# Patient Record
Sex: Male | Born: 1950 | Race: Black or African American | Hispanic: No | Marital: Single | Smoking: Current every day smoker
Health system: Southern US, Community
[De-identification: ages and names within clinical notes are randomized; demographics above are authoritative.]

## PROBLEM LIST (undated history)

## (undated) DIAGNOSIS — R21 Rash and other nonspecific skin eruption: Secondary | ICD-10-CM

## (undated) DIAGNOSIS — I10 Essential (primary) hypertension: Secondary | ICD-10-CM

## (undated) DIAGNOSIS — R197 Diarrhea, unspecified: Secondary | ICD-10-CM

## (undated) DIAGNOSIS — Z923 Personal history of irradiation: Secondary | ICD-10-CM

## (undated) DIAGNOSIS — R110 Nausea: Secondary | ICD-10-CM

## (undated) DIAGNOSIS — M1990 Unspecified osteoarthritis, unspecified site: Secondary | ICD-10-CM

## (undated) DIAGNOSIS — K859 Acute pancreatitis without necrosis or infection, unspecified: Secondary | ICD-10-CM

## (undated) DIAGNOSIS — G8929 Other chronic pain: Secondary | ICD-10-CM

## (undated) DIAGNOSIS — C799 Secondary malignant neoplasm of unspecified site: Secondary | ICD-10-CM

## (undated) DIAGNOSIS — C259 Malignant neoplasm of pancreas, unspecified: Secondary | ICD-10-CM

## (undated) DIAGNOSIS — F1911 Other psychoactive substance abuse, in remission: Secondary | ICD-10-CM

## (undated) DIAGNOSIS — M5106 Intervertebral disc disorders with myelopathy, lumbar region: Secondary | ICD-10-CM

## (undated) DIAGNOSIS — M549 Dorsalgia, unspecified: Secondary | ICD-10-CM

## (undated) HISTORY — DX: Malignant neoplasm of pancreas, unspecified: C25.9

## (undated) HISTORY — DX: Diarrhea, unspecified: R19.7

## (undated) HISTORY — DX: Secondary malignant neoplasm of unspecified site: C79.9

## (undated) HISTORY — PX: HYDROCELE EXCISION / REPAIR: SUR1145

## (undated) HISTORY — DX: Nausea: R11.0

## (undated) HISTORY — DX: Personal history of irradiation: Z92.3

## (undated) HISTORY — PX: HERNIA REPAIR: SHX51

## (undated) HISTORY — DX: Other chronic pain: G89.29

## (undated) HISTORY — DX: Rash and other nonspecific skin eruption: R21

## (undated) HISTORY — DX: Other psychoactive substance abuse, in remission: F19.11

## (undated) HISTORY — DX: Dorsalgia, unspecified: M54.9

---

## 2009-11-18 ENCOUNTER — Inpatient Hospital Stay (HOSPITAL_COMMUNITY): Admission: EM | Admit: 2009-11-18 | Discharge: 2009-11-21 | Payer: Self-pay | Admitting: Emergency Medicine

## 2009-11-18 ENCOUNTER — Ambulatory Visit: Payer: Self-pay | Admitting: Gastroenterology

## 2009-11-20 ENCOUNTER — Encounter (INDEPENDENT_AMBULATORY_CARE_PROVIDER_SITE_OTHER): Payer: Self-pay | Admitting: Internal Medicine

## 2009-11-25 ENCOUNTER — Ambulatory Visit (HOSPITAL_COMMUNITY): Admission: RE | Admit: 2009-11-25 | Discharge: 2009-11-25 | Payer: Self-pay | Admitting: Internal Medicine

## 2009-11-30 ENCOUNTER — Encounter: Payer: Self-pay | Admitting: Gastroenterology

## 2009-12-23 ENCOUNTER — Ambulatory Visit (HOSPITAL_COMMUNITY): Admission: RE | Admit: 2009-12-23 | Discharge: 2009-12-23 | Payer: Self-pay | Admitting: General Surgery

## 2010-01-12 ENCOUNTER — Ambulatory Visit (HOSPITAL_COMMUNITY): Admission: RE | Admit: 2010-01-12 | Discharge: 2010-01-12 | Payer: Self-pay | Admitting: General Surgery

## 2010-01-15 ENCOUNTER — Encounter: Payer: Self-pay | Admitting: Gastroenterology

## 2010-02-08 ENCOUNTER — Inpatient Hospital Stay (HOSPITAL_COMMUNITY): Admission: RE | Admit: 2010-02-08 | Discharge: 2010-02-23 | Payer: Self-pay | Admitting: General Surgery

## 2010-02-08 ENCOUNTER — Encounter (INDEPENDENT_AMBULATORY_CARE_PROVIDER_SITE_OTHER): Payer: Self-pay | Admitting: General Surgery

## 2010-02-08 HISTORY — PX: WHIPPLE PROCEDURE: SHX2667

## 2010-02-17 ENCOUNTER — Ambulatory Visit: Payer: Self-pay | Admitting: Oncology

## 2010-03-16 DIAGNOSIS — C259 Malignant neoplasm of pancreas, unspecified: Secondary | ICD-10-CM

## 2010-03-16 HISTORY — DX: Malignant neoplasm of pancreas, unspecified: C25.9

## 2010-03-25 ENCOUNTER — Ambulatory Visit: Payer: Self-pay | Admitting: Oncology

## 2010-04-07 ENCOUNTER — Ambulatory Visit
Admission: RE | Admit: 2010-04-07 | Discharge: 2010-05-12 | Payer: Self-pay | Source: Home / Self Care | Attending: Radiation Oncology | Admitting: Radiation Oncology

## 2010-04-23 ENCOUNTER — Encounter: Payer: Self-pay | Admitting: Gastroenterology

## 2010-06-15 NOTE — Letter (Signed)
Summary: [facility]   Imported By: Sherian Rein 02/08/2010 10:26:03  _____________________________________________________________________  External Attachment:    Type:   Image     Comment:   External Document

## 2010-06-15 NOTE — Letter (Signed)
Summary: [facility]   Imported By: Sherian Rein 12/16/2009 09:47:21  _____________________________________________________________________  External Attachment:    Type:   Image     Comment:   External Document

## 2010-06-17 NOTE — Letter (Signed)
Summary: [facility]   Imported By: Lennie Odor 05/31/2010 10:36:59  _____________________________________________________________________  External Attachment:    Type:   Image     Comment:   External Document

## 2010-07-29 LAB — POCT I-STAT 7, (LYTES, BLD GAS, ICA,H+H)
Acid-Base Excess: 2 mmol/L (ref 0.0–2.0)
Acid-Base Excess: 2 mmol/L (ref 0.0–2.0)
Bicarbonate: 29.1 mEq/L — ABNORMAL HIGH (ref 20.0–24.0)
HCT: 30 % — ABNORMAL LOW (ref 39.0–52.0)
HCT: 32 % — ABNORMAL LOW (ref 39.0–52.0)
Hemoglobin: 10.2 g/dL — ABNORMAL LOW (ref 13.0–17.0)
Hemoglobin: 10.9 g/dL — ABNORMAL LOW (ref 13.0–17.0)
Patient temperature: 36.3
Potassium: 4 mEq/L (ref 3.5–5.1)
Sodium: 138 mEq/L (ref 135–145)
pCO2 arterial: 52.7 mmHg — ABNORMAL HIGH (ref 35.0–45.0)
pH, Arterial: 7.347 — ABNORMAL LOW (ref 7.350–7.450)
pH, Arterial: 7.393 (ref 7.350–7.450)
pO2, Arterial: 553 mmHg — ABNORMAL HIGH (ref 80.0–100.0)

## 2010-07-29 LAB — CBC
HCT: 33.7 % — ABNORMAL LOW (ref 39.0–52.0)
HCT: 35.1 % — ABNORMAL LOW (ref 39.0–52.0)
HCT: 32.5 % — ABNORMAL LOW (ref 39.0–52.0)
Hemoglobin: 10.5 g/dL — ABNORMAL LOW (ref 13.0–17.0)
Hemoglobin: 10.5 g/dL — ABNORMAL LOW (ref 13.0–17.0)
MCH: 27.8 pg (ref 26.0–34.0)
MCH: 28.1 pg (ref 26.0–34.0)
MCHC: 32.8 g/dL (ref 30.0–36.0)
MCHC: 32.3 g/dL (ref 30.0–36.0)
MCHC: 32.9 g/dL (ref 30.0–36.0)
MCV: 84.3 fL (ref 78.0–100.0)
MCV: 85 fL (ref 78.0–100.0)
MCV: 86.3 fL (ref 78.0–100.0)
MCV: 86.7 fL (ref 78.0–100.0)
Platelets: 304 10*3/uL (ref 150–400)
Platelets: 398 10*3/uL (ref 150–400)
Platelets: 244 10*3/uL (ref 150–400)
Platelets: 255 10*3/uL (ref 150–400)
Platelets: 258 10*3/uL (ref 150–400)
Platelets: 280 10*3/uL (ref 150–400)
Platelets: 310 10*3/uL (ref 150–400)
RBC: 4 MIL/uL — ABNORMAL LOW (ref 4.22–5.81)
RBC: 3.71 MIL/uL — ABNORMAL LOW (ref 4.22–5.81)
RBC: 3.73 MIL/uL — ABNORMAL LOW (ref 4.22–5.81)
RBC: 3.85 MIL/uL — ABNORMAL LOW (ref 4.22–5.81)
RDW: 13.3 % (ref 11.5–15.5)
RDW: 13.5 % (ref 11.5–15.5)
RDW: 13.5 % (ref 11.5–15.5)
RDW: 14 % (ref 11.5–15.5)
RDW: 14.3 % (ref 11.5–15.5)
RDW: 14.6 % (ref 11.5–15.5)
RDW: 14.6 % (ref 11.5–15.5)
RDW: 14.7 % (ref 11.5–15.5)
WBC: 7.4 10*3/uL (ref 4.0–10.5)
WBC: 10 10*3/uL (ref 4.0–10.5)
WBC: 6.9 10*3/uL (ref 4.0–10.5)
WBC: 7.5 10*3/uL (ref 4.0–10.5)

## 2010-07-29 LAB — GLUCOSE, CAPILLARY
Glucose-Capillary: 103 mg/dL — ABNORMAL HIGH (ref 70–99)
Glucose-Capillary: 104 mg/dL — ABNORMAL HIGH (ref 70–99)
Glucose-Capillary: 106 mg/dL — ABNORMAL HIGH (ref 70–99)
Glucose-Capillary: 110 mg/dL — ABNORMAL HIGH (ref 70–99)
Glucose-Capillary: 112 mg/dL — ABNORMAL HIGH (ref 70–99)
Glucose-Capillary: 113 mg/dL — ABNORMAL HIGH (ref 70–99)
Glucose-Capillary: 104 mg/dL — ABNORMAL HIGH (ref 70–99)
Glucose-Capillary: 98 mg/dL (ref 70–99)

## 2010-07-29 LAB — BASIC METABOLIC PANEL
BUN: 6 mg/dL (ref 6–23)
BUN: 6 mg/dL (ref 6–23)
CO2: 31 mEq/L (ref 19–32)
CO2: 29 mEq/L (ref 19–32)
Calcium: 8.5 mg/dL (ref 8.4–10.5)
Chloride: 98 mEq/L (ref 96–112)
Creatinine, Ser: 0.97 mg/dL (ref 0.4–1.5)
Creatinine, Ser: 0.82 mg/dL (ref 0.4–1.5)
GFR calc Af Amer: >60 mL/min (ref >60)
Glucose, Bld: 107 mg/dL — ABNORMAL HIGH (ref 70–99)
Potassium: 3.6 mEq/L (ref 3.5–5.1)

## 2010-07-29 LAB — COMPREHENSIVE METABOLIC PANEL
ALT: 23 U/L (ref 0–53)
ALT: 26 U/L (ref 0–53)
ALT: 23 U/L (ref 0–53)
ALT: 23 U/L (ref 0–53)
ALT: 29 U/L (ref 0–53)
ALT: 31 U/L (ref 0–53)
AST: 26 U/L (ref 0–37)
AST: 32 U/L (ref 0–37)
Albumin: 2.5 g/dL — ABNORMAL LOW (ref 3.5–5.2)
Albumin: 2.6 g/dL — ABNORMAL LOW (ref 3.5–5.2)
Albumin: 2.7 g/dL — ABNORMAL LOW (ref 3.5–5.2)
Albumin: 2.5 g/dL — ABNORMAL LOW (ref 3.5–5.2)
Albumin: 2.5 g/dL — ABNORMAL LOW (ref 3.5–5.2)
Albumin: 2.7 g/dL — ABNORMAL LOW (ref 3.5–5.2)
Albumin: 4.1 g/dL (ref 3.5–5.2)
Alkaline Phosphatase: 57 U/L (ref 39–117)
Alkaline Phosphatase: 65 U/L (ref 39–117)
Alkaline Phosphatase: 40 U/L (ref 39–117)
Alkaline Phosphatase: 47 U/L (ref 39–117)
Alkaline Phosphatase: 52 U/L (ref 39–117)
Alkaline Phosphatase: 70 U/L (ref 39–117)
BUN: 4 mg/dL — ABNORMAL LOW (ref 6–23)
BUN: 7 mg/dL (ref 6–23)
BUN: 13 mg/dL (ref 6–23)
BUN: 6 mg/dL (ref 6–23)
BUN: 7 mg/dL (ref 6–23)
BUN: 7 mg/dL (ref 6–23)
CO2: 25 mEq/L (ref 19–32)
Calcium: 8 mg/dL — ABNORMAL LOW (ref 8.4–10.5)
Calcium: 8.5 mg/dL (ref 8.4–10.5)
Calcium: 8.6 mg/dL (ref 8.4–10.5)
Chloride: 95 mEq/L — ABNORMAL LOW (ref 96–112)
Chloride: 103 mEq/L (ref 96–112)
Creatinine, Ser: 1.02 mg/dL (ref 0.4–1.5)
Creatinine, Ser: 0.78 mg/dL (ref 0.4–1.5)
GFR calc non Af Amer: >60 mL/min (ref >60)
Glucose, Bld: 424 mg/dL — ABNORMAL HIGH (ref 70–99)
Glucose, Bld: 77 mg/dL (ref 70–99)
Glucose, Bld: 149 mg/dL — ABNORMAL HIGH (ref 70–99)
Potassium: 3.4 mEq/L — ABNORMAL LOW (ref 3.5–5.1)
Potassium: 3.8 mEq/L (ref 3.5–5.1)
Potassium: 3.9 mEq/L (ref 3.5–5.1)
Potassium: 3.9 mEq/L (ref 3.5–5.1)
Potassium: 5.1 mEq/L (ref 3.5–5.1)
Potassium: 5.3 mEq/L — ABNORMAL HIGH (ref 3.5–5.1)
Sodium: 132 mEq/L — ABNORMAL LOW (ref 135–145)
Sodium: 140 mEq/L (ref 135–145)
Sodium: 137 mEq/L (ref 135–145)
Sodium: 137 mEq/L (ref 135–145)
Sodium: 140 mEq/L (ref 135–145)
Total Bilirubin: 0.6 mg/dL (ref 0.3–1.2)
Total Bilirubin: 0.5 mg/dL (ref 0.3–1.2)
Total Protein: 5.2 g/dL — ABNORMAL LOW (ref 6.0–8.3)
Total Protein: 5.7 g/dL — ABNORMAL LOW (ref 6.0–8.3)
Total Protein: 6.1 g/dL (ref 6.0–8.3)
Total Protein: 5.5 g/dL — ABNORMAL LOW (ref 6.0–8.3)
Total Protein: 5.5 g/dL — ABNORMAL LOW (ref 6.0–8.3)
Total Protein: 5.5 g/dL — ABNORMAL LOW (ref 6.0–8.3)
Total Protein: 5.5 g/dL — ABNORMAL LOW (ref 6.0–8.3)
Total Protein: 7.7 g/dL (ref 6.0–8.3)

## 2010-07-29 LAB — TYPE AND SCREEN: Antibody Screen: NEGATIVE

## 2010-07-29 LAB — RAPID URINE DRUG SCREEN, HOSP PERFORMED
Barbiturates: NOT DETECTED
Benzodiazepines: NOT DETECTED
Cocaine: NOT DETECTED

## 2010-07-29 LAB — CANCER ANTIGEN 19-9: CA 19-9: 24.8 U/mL — ABNORMAL LOW (ref <35.0)

## 2010-07-29 LAB — MAGNESIUM: Magnesium: 1.8 mg/dL (ref 1.5–2.5)

## 2010-07-29 LAB — PREALBUMIN: Prealbumin: 10.5 mg/dL — ABNORMAL LOW (ref 18.0–45.0)

## 2010-07-29 LAB — DIFFERENTIAL
Basophils Relative: 0 % (ref 0–1)
Eosinophils Absolute: 0.1 10*3/uL (ref 0.0–0.7)
Monocytes Absolute: 0.6 10*3/uL (ref 0.1–1.0)
Monocytes Relative: 8 % (ref 3–12)

## 2010-07-29 LAB — HEMOGLOBIN A1C
Hgb A1c MFr Bld: 6 % — ABNORMAL HIGH (ref <5.7)
Mean Plasma Glucose: 126 mg/dL — ABNORMAL HIGH (ref <117)

## 2010-07-29 LAB — PHOSPHORUS
Phosphorus: 3.5 mg/dL (ref 2.3–4.6)
Phosphorus: 3.8 mg/dL (ref 2.3–4.6)

## 2010-07-29 LAB — ABO/RH: ABO/RH(D): B POS

## 2010-07-29 LAB — APTT: aPTT: 32 seconds (ref 24–37)

## 2010-07-30 LAB — CROSSMATCH: Antibody Screen: NEGATIVE

## 2010-07-30 LAB — PREALBUMIN: Prealbumin: 15.8 mg/dL — ABNORMAL LOW (ref 18.0–45.0)

## 2010-07-30 LAB — COMPREHENSIVE METABOLIC PANEL
ALT: 30 U/L (ref 0–53)
AST: 31 U/L (ref 0–37)
Alkaline Phosphatase: 58 U/L (ref 39–117)
CO2: 28 mEq/L (ref 19–32)
Calcium: 9.6 mg/dL (ref 8.4–10.5)
Chloride: 103 mEq/L (ref 96–112)
GFR calc non Af Amer: >60 mL/min (ref >60)
Glucose, Bld: 91 mg/dL (ref 70–99)
Sodium: 139 mEq/L (ref 135–145)
Total Bilirubin: 0.4 mg/dL (ref 0.3–1.2)

## 2010-07-30 LAB — APTT: aPTT: 41 seconds — ABNORMAL HIGH (ref 24–37)

## 2010-07-30 LAB — RAPID URINE DRUG SCREEN, HOSP PERFORMED
Amphetamines: NOT DETECTED
Amphetamines: NOT DETECTED
Barbiturates: NOT DETECTED
Benzodiazepines: NOT DETECTED
Cocaine: POSITIVE — AB
Opiates: POSITIVE — AB
Tetrahydrocannabinol: NOT DETECTED
Tetrahydrocannabinol: NOT DETECTED

## 2010-07-30 LAB — CBC
HCT: 34.6 % — ABNORMAL LOW (ref 39.0–52.0)
HCT: 37.4 % — ABNORMAL LOW (ref 39.0–52.0)
Hemoglobin: 11.6 g/dL — ABNORMAL LOW (ref 13.0–17.0)
MCH: 29.2 pg (ref 26.0–34.0)
MCHC: 33.5 g/dL (ref 30.0–36.0)
MCHC: 33 g/dL (ref 30.0–36.0)
MCV: 88.3 fL (ref 78.0–100.0)
Platelets: 441 10*3/uL — ABNORMAL HIGH (ref 150–400)
RBC: 3.96 MIL/uL — ABNORMAL LOW (ref 4.22–5.81)
RDW: 15.3 % (ref 11.5–15.5)
WBC: 5.8 10*3/uL (ref 4.0–10.5)

## 2010-07-30 LAB — PROTIME-INR
INR: 1.11 (ref 0.00–1.49)
Prothrombin Time: 14.2 seconds (ref 11.6–15.2)
Prothrombin Time: 14.5 seconds (ref 11.6–15.2)

## 2010-07-30 LAB — URINALYSIS, ROUTINE W REFLEX MICROSCOPIC
Bilirubin Urine: NEGATIVE
Leukocytes, UA: NEGATIVE
Nitrite: NEGATIVE
Specific Gravity, Urine: 1.023 (ref 1.005–1.030)
Urobilinogen, UA: 1 mg/dL (ref 0.0–1.0)
pH: 6.5 (ref 5.0–8.0)

## 2010-07-30 LAB — SURGICAL PCR SCREEN
MRSA, PCR: NEGATIVE
Staphylococcus aureus: NEGATIVE

## 2010-07-30 LAB — DIFFERENTIAL
Basophils Absolute: 0 10*3/uL (ref 0.0–0.1)
Basophils Relative: 1 % (ref 0–1)
Eosinophils Absolute: 0.1 10*3/uL (ref 0.0–0.7)
Eosinophils Relative: 2 % (ref 0–5)
Neutrophils Relative %: 49 % (ref 43–77)

## 2010-07-30 LAB — URINE MICROSCOPIC-ADD ON

## 2010-07-30 LAB — TYPE AND SCREEN
ABO/RH(D): B POS
Antibody Screen: NEGATIVE

## 2010-08-01 LAB — URINE CULTURE: Culture: NO GROWTH

## 2010-08-01 LAB — CHROMOGRANIN A: Chromogranin A: 7.6 ng/mL (ref 1.9–15.0)

## 2010-08-01 LAB — DIFFERENTIAL
Lymphocytes Relative: 14 % (ref 12–46)
Lymphs Abs: 1.5 10*3/uL (ref 0.7–4.0)
Monocytes Absolute: 0.4 10*3/uL (ref 0.1–1.0)
Monocytes Relative: 4 % (ref 3–12)
Neutro Abs: 8.8 10*3/uL — ABNORMAL HIGH (ref 1.7–7.7)
Neutrophils Relative %: 82 % — ABNORMAL HIGH (ref 43–77)

## 2010-08-01 LAB — HEPATIC FUNCTION PANEL
ALT: 55 U/L — ABNORMAL HIGH (ref 0–53)
AST: 84 U/L — ABNORMAL HIGH (ref 0–37)
Albumin: 3.9 g/dL (ref 3.5–5.2)
Alkaline Phosphatase: 75 U/L (ref 39–117)
Bilirubin, Direct: <0.1 mg/dL (ref 0.0–0.3)
Total Bilirubin: 0.3 mg/dL (ref 0.3–1.2)

## 2010-08-01 LAB — GLUCOSE, CAPILLARY
Glucose-Capillary: 109 mg/dL — ABNORMAL HIGH (ref 70–99)
Glucose-Capillary: 81 mg/dL (ref 70–99)

## 2010-08-01 LAB — POCT I-STAT, CHEM 8
BUN: 13 mg/dL (ref 6–23)
Creatinine, Ser: 1 mg/dL (ref 0.4–1.5)
Glucose, Bld: 118 mg/dL — ABNORMAL HIGH (ref 70–99)
Sodium: 137 mEq/L (ref 135–145)
TCO2: 29 mmol/L (ref 0–100)

## 2010-08-01 LAB — COMPREHENSIVE METABOLIC PANEL
AST: 108 U/L — ABNORMAL HIGH (ref 0–37)
Albumin: 3.1 g/dL — ABNORMAL LOW (ref 3.5–5.2)
Alkaline Phosphatase: 57 U/L (ref 39–117)
Alkaline Phosphatase: 64 U/L (ref 39–117)
BUN: 6 mg/dL (ref 6–23)
BUN: 8 mg/dL (ref 6–23)
CO2: 25 mEq/L (ref 19–32)
Chloride: 100 mEq/L (ref 96–112)
Chloride: 99 mEq/L (ref 96–112)
GFR calc Af Amer: >60 mL/min (ref >60)
GFR calc non Af Amer: >60 mL/min (ref >60)
Glucose, Bld: 125 mg/dL — ABNORMAL HIGH (ref 70–99)
Potassium: 3.8 mEq/L (ref 3.5–5.1)
Potassium: 4.5 mEq/L (ref 3.5–5.1)
Total Bilirubin: 0.4 mg/dL (ref 0.3–1.2)
Total Bilirubin: 0.6 mg/dL (ref 0.3–1.2)

## 2010-08-01 LAB — CBC
HCT: 33.6 % — ABNORMAL LOW (ref 39.0–52.0)
HCT: 40.6 % (ref 39.0–52.0)
HCT: 42.3 % (ref 39.0–52.0)
Hemoglobin: 11 g/dL — ABNORMAL LOW (ref 13.0–17.0)
Hemoglobin: 11.5 g/dL — ABNORMAL LOW (ref 13.0–17.0)
Hemoglobin: 14 g/dL (ref 13.0–17.0)
MCHC: 32.8 g/dL (ref 30.0–36.0)
MCHC: 33.1 g/dL (ref 30.0–36.0)
MCV: 88.6 fL (ref 78.0–100.0)
MCV: 89.6 fL (ref 78.0–100.0)
RBC: 3.75 MIL/uL — ABNORMAL LOW (ref 4.22–5.81)
RBC: 3.89 MIL/uL — ABNORMAL LOW (ref 4.22–5.81)
RBC: 4.58 MIL/uL (ref 4.22–5.81)
RBC: 4.75 MIL/uL (ref 4.22–5.81)
RDW: 13.9 % (ref 11.5–15.5)
WBC: 10.1 10*3/uL (ref 4.0–10.5)
WBC: 7 10*3/uL (ref 4.0–10.5)
WBC: 8.3 10*3/uL (ref 4.0–10.5)

## 2010-08-01 LAB — POCT CARDIAC MARKERS: Myoglobin, poc: 48 ng/mL (ref 12–200)

## 2010-08-01 LAB — URINALYSIS, ROUTINE W REFLEX MICROSCOPIC
Bilirubin Urine: NEGATIVE
Nitrite: NEGATIVE
Specific Gravity, Urine: 1.028 (ref 1.005–1.030)
Urobilinogen, UA: 0.2 mg/dL (ref 0.0–1.0)
pH: 6 (ref 5.0–8.0)

## 2010-08-01 LAB — BASIC METABOLIC PANEL
Calcium: 8.6 mg/dL (ref 8.4–10.5)
GFR calc Af Amer: >60 mL/min (ref >60)
GFR calc non Af Amer: >60 mL/min (ref >60)
Sodium: 137 mEq/L (ref 135–145)

## 2010-08-01 LAB — CEA: CEA: 1.1 ng/mL (ref 0.0–5.0)

## 2010-08-01 LAB — URINE DRUGS OF ABUSE SCREEN W ALC, ROUTINE (REF LAB)
Barbiturate Quant, Ur: NEGATIVE
Benzodiazepines.: NEGATIVE
Marijuana Metabolite: NEGATIVE
Opiate Screen, Urine: NEGATIVE
Phencyclidine (PCP): NEGATIVE
Propoxyphene: NEGATIVE

## 2010-08-01 LAB — 5 HIAA, QUANTITATIVE, URINE, 24 HOUR
5-HIAA, 24 Hr Urine: 5.3 mg/24 h (ref <=6.0)
Volume, Urine-5HIAA: 2100 mL/24 h

## 2010-08-01 LAB — PROTIME-INR
INR: 1.18 (ref 0.00–1.49)
Prothrombin Time: 14.9 seconds (ref 11.6–15.2)

## 2010-08-01 LAB — RAPID URINE DRUG SCREEN, HOSP PERFORMED
Amphetamines: NOT DETECTED
Barbiturates: NOT DETECTED
Cocaine: NOT DETECTED
Opiates: POSITIVE — AB

## 2010-08-01 LAB — URINE MICROSCOPIC-ADD ON

## 2010-08-01 LAB — CANCER ANTIGEN 19-9: CA 19-9: 29.1 U/mL — ABNORMAL LOW (ref <35.0)

## 2010-08-01 LAB — LIPASE, BLOOD: Lipase: 80 U/L — ABNORMAL HIGH (ref 11–59)

## 2010-08-06 ENCOUNTER — Other Ambulatory Visit: Payer: Self-pay | Admitting: Orthopaedic Surgery

## 2010-08-06 ENCOUNTER — Encounter (HOSPITAL_COMMUNITY): Payer: Medicaid Other

## 2010-08-06 LAB — CBC
Hemoglobin: 13.9 g/dL (ref 13.0–17.0)
MCH: 29.8 pg (ref 26.0–34.0)
MCHC: 32.6 g/dL (ref 30.0–36.0)
MCV: 91.2 fL (ref 78.0–100.0)
RBC: 4.67 MIL/uL (ref 4.22–5.81)

## 2010-08-06 LAB — URINE MICROSCOPIC-ADD ON

## 2010-08-06 LAB — SURGICAL PCR SCREEN
MRSA, PCR: NEGATIVE
Staphylococcus aureus: NEGATIVE

## 2010-08-06 LAB — PROTIME-INR: Prothrombin Time: 14.5 seconds (ref 11.6–15.2)

## 2010-08-06 LAB — URINALYSIS, ROUTINE W REFLEX MICROSCOPIC
Glucose, UA: NEGATIVE mg/dL
Hgb urine dipstick: NEGATIVE
Specific Gravity, Urine: 1.026 (ref 1.005–1.030)

## 2010-08-06 LAB — DIFFERENTIAL
Basophils Relative: 0 % (ref 0–1)
Eosinophils Absolute: 0.1 10*3/uL (ref 0.0–0.7)
Lymphs Abs: 2.4 10*3/uL (ref 0.7–4.0)
Monocytes Absolute: 0.4 10*3/uL (ref 0.1–1.0)
Monocytes Relative: 9 % (ref 3–12)
Neutro Abs: 1.9 10*3/uL (ref 1.7–7.7)
Neutrophils Relative %: 39 % — ABNORMAL LOW (ref 43–77)

## 2010-08-06 LAB — BASIC METABOLIC PANEL
Chloride: 106 mEq/L (ref 96–112)
GFR calc Af Amer: >60 mL/min (ref >60)
GFR calc non Af Amer: >60 mL/min (ref >60)
Potassium: 4.5 mEq/L (ref 3.5–5.1)
Sodium: 141 mEq/L (ref 135–145)

## 2010-08-13 ENCOUNTER — Inpatient Hospital Stay (HOSPITAL_COMMUNITY): Payer: Medicaid Other

## 2010-08-13 ENCOUNTER — Inpatient Hospital Stay (HOSPITAL_COMMUNITY)
Admission: RE | Admit: 2010-08-13 | Discharge: 2010-08-20 | DRG: 470 | Disposition: A | Discharge status: Home Health | Payer: Medicaid Other | Source: Ambulatory Visit | Attending: Orthopaedic Surgery | Admitting: Orthopaedic Surgery

## 2010-08-13 DIAGNOSIS — I10 Essential (primary) hypertension: Secondary | ICD-10-CM | POA: Diagnosis present

## 2010-08-13 DIAGNOSIS — Z8509 Personal history of malignant neoplasm of other digestive organs: Secondary | ICD-10-CM

## 2010-08-13 DIAGNOSIS — Z01812 Encounter for preprocedural laboratory examination: Secondary | ICD-10-CM

## 2010-08-13 DIAGNOSIS — F17200 Nicotine dependence, unspecified, uncomplicated: Secondary | ICD-10-CM | POA: Diagnosis present

## 2010-08-13 DIAGNOSIS — M169 Osteoarthritis of hip, unspecified: Principal | ICD-10-CM | POA: Diagnosis present

## 2010-08-13 DIAGNOSIS — M1610 Unilateral primary osteoarthritis, unspecified hip: Principal | ICD-10-CM | POA: Diagnosis present

## 2010-08-13 LAB — TYPE AND SCREEN
ABO/RH(D): B POS
Antibody Screen: NEGATIVE

## 2010-08-14 HISTORY — PX: OTHER SURGICAL HISTORY: SHX169

## 2010-08-14 LAB — CBC
MCH: 29 pg (ref 26.0–34.0)
Platelets: 236 10*3/uL (ref 150–400)
RBC: 3.97 MIL/uL — ABNORMAL LOW (ref 4.22–5.81)
WBC: 6.7 10*3/uL (ref 4.0–10.5)

## 2010-08-14 LAB — BASIC METABOLIC PANEL
Chloride: 100 mEq/L (ref 96–112)
Creatinine, Ser: 0.97 mg/dL (ref 0.4–1.5)
GFR calc Af Amer: >60 mL/min (ref >60)
Potassium: 4.6 mEq/L (ref 3.5–5.1)

## 2010-08-14 LAB — PROTIME-INR
INR: 1.18 (ref 0.00–1.49)
Prothrombin Time: 15.2 seconds (ref 11.6–15.2)

## 2010-08-14 NOTE — H&P (Signed)
  NAMEKELLEY, [name]          ACCOUNT NO.:  [id number]  MEDICAL RECORD NO.:  [id number]           PATIENT TYPE:  I  LOCATION:  1608                         FACILITY:  [facility]  PHYSICIAN:  Vanita Panda. Magnus Ivan, M.D.DATE OF BIRTH:  Dec 25, 1950  DATE OF ADMISSION:  08/13/2010 DATE OF DISCHARGE:                             HISTORY & PHYSICAL   CHIEF COMPLAINT:  Severe right hip pain with known osteoarthritis.  HISTORY OF PRESENT ILLNESS:  [name] is a 60 year old gentleman on disability secondary to pancreatic cancer.  He has been treated by Dr. Donell Beers here with [facility] successfully.  He has had severe pain in his right hip for sometime and x-rays showed end-stage arthritis of that hip.  Due to the fact that he has done so well with his cancer treatments, but his activities of daily living are affected greatly due to his hip pain.  He wished to proceed with a total hip replacement.  He understands the risks and benefits of this and would like to proceed with surgery.  PAST MEDICAL HISTORY: 1. Pancreatic cancer. 2. Hypertension.  ALLERGIES:  HYDROMORPHONE.  MEDICATIONS: 1. Percocet. 2. Ativan. 3. Lopressor. 4. Multivitamin.  SOCIAL HISTORY:  He is on disability.  He lives alone.  He is single. He does smoke a pack a day for the last 30 years.  He does drink alcohol on occasion.  REVIEW OF SYSTEMS:  Negative for chest pain, shortness of breath, fever, chills, nausea, vomiting.  Negative for abdominal pain.  Positive for right hip pain.  PHYSICAL EXAMINATION:  VITAL SIGNS:  He is afebrile with stable vital signs. GENERAL:  He is alert, oriented x3, in no acute distress. HEENT:  Normocephalic, atraumatic.  Pupils are equal, round, and reactive to light.  Extraocular muscles are intact. NECK:  Supple. LUNGS:  Clear to auscultation bilaterally. HEART:  Regular rate and rhythm. ABDOMEN:  Soft, nontender, and nondistended with multiple scars  from previous abdominal pain surgery. EXTREMITIES:  Right hip show severe pain with internal and external rotation.  His leg lengths are equal.  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  PLAN:  We will proceed with right total hip arthroplasty today.  Again, he understands the risks and benefits of this and the reason behind planning for surgery and would like to have this done due to its affect on his activities of his daily living.  He understands the risk of acute blood loss anemia, DVT, and PE, and he would likely need skilled nursing placement after his acute hospital stay due to him living alone.     Vanita Panda. Magnus Ivan, M.D.     CYB/MEDQ  D:  08/13/2010  T:  08/14/2010  Job:  [id number]  Electronically Signed by Doneen Poisson M.D. on 08/14/2010 04:09:06 PM

## 2010-08-14 NOTE — Op Note (Signed)
NAMEAMEAR, STROJNY          ACCOUNT NO.:  [id number]  MEDICAL RECORD NO.:  [id number]           PATIENT TYPE:  I  LOCATION:  1608                         FACILITY:  [facility]  PHYSICIAN:  Vanita Panda. Magnus Ivan, M.D.DATE OF BIRTH:  [birth year]  DATE OF PROCEDURE:  08/13/2010 DATE OF DISCHARGE:                              OPERATIVE REPORT   PREOPERATIVE DIAGNOSES:  Severe osteoarthritis and degenerative joint disease, right hip.  POSTOPERATIVE DIAGNOSES:  Severe osteoarthritis and degenerative joint disease, right hip.  PROCEDURE:  Right total hip arthroplasty through direct anterior approach.  IMPLANTS:  DePuy Pinnacle sector cup with Gription size 54, size 36 neutral polyethylene liner, 36 -2 metal femoral head, size 11 Corail HA coated femoral component with standard offset.  SURGEON:  Vanita Panda. Magnus Ivan, M.D.  ANESTHESIA:  General.  ANTIBIOTICS:  2 g of IV Ancef.  BLOOD LOSS:  600 cc.  COMPLICATIONS:  None.  INDICATIONS:  [name] is a 60 year old gentleman who survived surgery from pancreatic cancer.  He has severe debilitating arthritis of his right hip and x-ray showed end-stage arthritis.  This is affecting his activities of daily living.  He did wish to proceed with total hip arthroplasty.  The risks and benefits of surgery had been explained him in detail and he did wish to proceed with surgery.  Risks include the risk of acute blood loss anemia, DVT, and PE.  DESCRIPTION OF PROCEDURE:  After informed consent was obtained, appropriate right hip was marked.  He was brought to the operating room and general anesthesia was obtained when he was on a stretcher.  A Foley catheter was placed and then traction boots were placed on his feet.  He was then placed on the Hana table and both feet were placed in a large- scale retraction with no traction applied and perineal post was placed. His right operative field was then prepped and draped with DuraPrep  and sterile drapes and a time-out was called, which identified the correct patient, correct right hip.  I then made an incision 1 cm distal and 3 cm posterior to the anterosuperior iliac spine and carried this down the leg.  I dissected down through the tensor fascia lata and this was divided longitudinally.  I then proceeded with a direct anterior approach of the hip.  A Cobra retractor was placed along the anterolateral femoral neck.  I teased up under the rectus femoris and placed a retractor immediately.  I was then able to coagulate the lateral femoral circumflex vessels.  I then divided the capsule and found blunted amount of fluid in his joint as well as significant amount of osteophytes.  I placed a Cobra retractors within the hip capsule identify the femoral neck again under direct fluoroscopic guidance as well as direct visualization.  I then made femoral neck cut and it was certainly proximal to the lesser trochanter and similar to behind neck cut.  A cork screw was put into the femoral head that was removed in itsentirety.  I then cleaned the acetabulum from debris.  I then started reaming from 2 mm increments from size 43 up to size 53.  The last few  reamers were placed under fluoroscopic guidance.  I then chose a 54 acetabular component and under direct visualization and fluoroscopic guidance, we placed this into the acetabulum and knocked this into place with a mallet.  I then placed a real 36 neutral polyethylene liner.  Next, attention was turned to the femur.  A hook was placed under the vastus ridge of the greater trochanter and then the leg was externally rotated to 90 degrees, extended, and adducted.  The jack was raised on the outside.  This raised the femur up slightly.  We used a Mueller retractor medially and then under the greater trochanter an Omni retractor.  I then released the capsule from the lateral neck anteriorly.  I then used a cookie cutter guide open  up the femoral canal as well as a rongeur.  I then began broaching especially this small Tri- Lock, thus Tri-Lock broach then started from size 8 from the Corail broaching system up to size 11.  I did look this under direct fluoroscopy and felt the same was in more varus than I would like so, I removed some more bone of the greater trochanter laterally to the lateralize the component.  I put this back in and then placed a 36 -2 head because he seemed to be too long with the 36 +1.5.  We reduced this into the acetabulum.  His leg lengths appeared equal.  It was stable over 90 degrees of external rotation as well as 45 degrees of internal rotation.  The shuck was minimal.  I then removed all trails instrumentation and placed the real HA coated size 11 femoral component by the real 36 -2 metal hip ball.  We reduced this back in the acetabulum and again this was tested fluoroscopically and clinically and it was stable.  We copiously irrigated tissue and I closed the deep joint capsule with interrupted #1 Ethibond suture.  I reapproximated tensor fascia lata with interrupted #1 Vicryl followed by 2-0 Vicryl and subcutaneous tissue with interrupted staples on the skin.  Xeroform followed by well-padded sterile dressing was applied.  The patient was taken off the Hana table, awake, and extubated and taken to the recovery room in stable count.  All final counts were correct.  There were no complications noted.     Vanita Panda. Magnus Ivan, M.D.     CYB/MEDQ  D:  08/13/2010  T:  08/14/2010  Job:  [id number]  Electronically Signed by Doneen Poisson M.D. on 08/14/2010 04:09:04 PM

## 2010-08-16 LAB — PROTIME-INR: Prothrombin Time: 24.1 seconds — ABNORMAL HIGH (ref 11.6–15.2)

## 2010-08-18 LAB — PROTIME-INR: Prothrombin Time: 22.7 seconds — ABNORMAL HIGH (ref 11.6–15.2)

## 2010-08-19 LAB — PROTIME-INR
INR: 2.03 — ABNORMAL HIGH (ref 0.00–1.49)
Prothrombin Time: 23.1 seconds — ABNORMAL HIGH (ref 11.6–15.2)

## 2010-08-20 LAB — PROTIME-INR: INR: 1.83 — ABNORMAL HIGH (ref 0.00–1.49)

## 2010-08-22 NOTE — Discharge Summary (Signed)
  NAMEMACGUIRE, HOLSINGER          ACCOUNT NO.:  [id number]  MEDICAL RECORD NO.:  [id number]           PATIENT TYPE:  I  LOCATION:  1608                         FACILITY:  [facility]  PHYSICIAN:  Vanita Panda. Magnus Ivan, M.D.DATE OF BIRTH:  [birth year]  DATE OF ADMISSION:  08/13/2010 DATE OF DISCHARGE:  08/18/2010                              DISCHARGE SUMMARY   ADMITTING DIAGNOSES:  Severe osteoarthritis and degenerative joint disease, right hip.  SECONDARY DIAGNOSES: 1. Previous pancreatic cancer. 2. Hypertension.  DISCHARGE DIAGNOSIS:  Status post right total hip arthroplasty.  PROCEDURE:  Right total hip arthroplasty through direct anterior approach on August 13, 2010.  HOSPITAL COURSE:  [name] is a 60 year old gentleman, on disability secondary to pancreatic cancer who has had successful surgery by Dr. Donell Beers at [facility].  He was found to have debilitating arthritis, involving his right hip.  It was recommended he undergo a right total hip arthroplasty due to the fact it affective on his activities of daily living and his pain.  He was taken to the operating room on the day of admission where he underwent a successful right total hip arthroplasty through the anterior approach.  He was then admitted as an inpatient to orthopedic floor.  Due to the fact that he lives alone, he is to be placed in short-term skilled nursing facility.  His hospital course was uneventful.  He remained afebrile with stable vital signs and his incision was clean, dry, and intact on the day of discharge.  DISPOSITION:  To short-term skilled nursing facility.  DISCHARGE INSTRUCTIONS:  While he is at the skilled nursing facility, he can ambulate with therapy with gait training and balance coordination. There will be no hip precautions.  His incision can get wet in the shower as well.  A followup appointment should be established with Dr. Eliberto Ivory office in 2 weeks after  discharge.  DISCHARGE MEDICATIONS: 1. Oxycodone 5 mg 1-3 p.o. q.3-4 h. p.r.n. 2. Robaxin 500 mg p.o. q.6 h. p.r.n. spasms. 3. Coumadin 5 mg one-half tablet p.o. q.6 p.m. daily, adjust dose for     target INR of 2.0 to 3.0. 4. Metoprolol 25 mg p.o. b.i.d. 5. Colace 100 mg p.o. b.i.d. 6. Multivitamin 1 p.o. daily.     Vanita Panda. Magnus Ivan, M.D.     CYB/MEDQ  D:  08/17/2010  T:  08/18/2010  Job:  [id number]  Electronically Signed by Doneen Poisson M.D. on 08/22/2010 04:15:56 PM

## 2010-11-14 DIAGNOSIS — C799 Secondary malignant neoplasm of unspecified site: Secondary | ICD-10-CM

## 2010-11-14 HISTORY — DX: Secondary malignant neoplasm of unspecified site: C79.9

## 2010-11-18 ENCOUNTER — Emergency Department (HOSPITAL_COMMUNITY): Payer: Medicaid Other

## 2010-11-18 ENCOUNTER — Emergency Department (HOSPITAL_COMMUNITY)
Admission: EM | Admit: 2010-11-18 | Discharge: 2010-11-18 | Disposition: A | Discharge status: Home / Self Care | Payer: Medicaid Other | Attending: Emergency Medicine | Admitting: Emergency Medicine

## 2010-11-18 DIAGNOSIS — I10 Essential (primary) hypertension: Secondary | ICD-10-CM | POA: Insufficient documentation

## 2010-11-18 DIAGNOSIS — K5900 Constipation, unspecified: Secondary | ICD-10-CM | POA: Insufficient documentation

## 2010-11-18 DIAGNOSIS — R109 Unspecified abdominal pain: Secondary | ICD-10-CM | POA: Insufficient documentation

## 2010-11-18 LAB — CBC
MCH: 29.6 pg (ref 26.0–34.0)
MCHC: 34 g/dL (ref 30.0–36.0)
MCV: 87.1 fL (ref 78.0–100.0)
Platelets: 295 10*3/uL (ref 150–400)
RDW: 13.2 % (ref 11.5–15.5)

## 2010-11-18 LAB — COMPREHENSIVE METABOLIC PANEL
AST: 25 U/L (ref 0–37)
Albumin: 3.1 g/dL — ABNORMAL LOW (ref 3.5–5.2)
BUN: 6 mg/dL (ref 6–23)
CO2: 25 mEq/L (ref 19–32)
Calcium: 8.6 mg/dL (ref 8.4–10.5)
Creatinine, Ser: 1 mg/dL (ref 0.50–1.35)
GFR calc Af Amer: >60 mL/min (ref >60)
GFR calc non Af Amer: >60 mL/min (ref >60)
Total Protein: 7 g/dL (ref 6.0–8.3)

## 2010-11-18 LAB — URINALYSIS, ROUTINE W REFLEX MICROSCOPIC
Glucose, UA: NEGATIVE mg/dL
Hgb urine dipstick: NEGATIVE
Ketones, ur: 15 mg/dL — AB
Leukocytes, UA: NEGATIVE
Nitrite: NEGATIVE
Protein, ur: 100 mg/dL — AB
Specific Gravity, Urine: 1.033 — ABNORMAL HIGH (ref 1.005–1.030)
pH: 5.5 (ref 5.0–8.0)

## 2010-11-18 LAB — URINE MICROSCOPIC-ADD ON

## 2010-11-18 LAB — DIFFERENTIAL
Basophils Absolute: 0 10*3/uL (ref 0.0–0.1)
Basophils Relative: 0 % (ref 0–1)
Lymphocytes Relative: 43 % (ref 12–46)
Monocytes Relative: 9 % (ref 3–12)
Neutro Abs: 2.9 10*3/uL (ref 1.7–7.7)
Neutrophils Relative %: 46 % (ref 43–77)

## 2010-11-18 MED ORDER — IOHEXOL 300 MG/ML  SOLN
80.0000 mL | Freq: Once | INTRAMUSCULAR | Status: AC | PRN
  Administered 2010-11-18: 80 mL via INTRAVENOUS

## 2010-11-22 ENCOUNTER — Telehealth (INDEPENDENT_AMBULATORY_CARE_PROVIDER_SITE_OTHER): Payer: Self-pay

## 2010-11-23 ENCOUNTER — Other Ambulatory Visit: Payer: Self-pay | Admitting: Orthopaedic Surgery

## 2010-11-23 ENCOUNTER — Other Ambulatory Visit (INDEPENDENT_AMBULATORY_CARE_PROVIDER_SITE_OTHER): Payer: Self-pay | Admitting: General Surgery

## 2010-11-23 DIAGNOSIS — C259 Malignant neoplasm of pancreas, unspecified: Secondary | ICD-10-CM

## 2010-11-23 DIAGNOSIS — M549 Dorsalgia, unspecified: Secondary | ICD-10-CM

## 2010-11-23 DIAGNOSIS — M79604 Pain in right leg: Secondary | ICD-10-CM

## 2010-11-23 DIAGNOSIS — M25551 Pain in right hip: Secondary | ICD-10-CM

## 2010-11-24 ENCOUNTER — Ambulatory Visit
Admission: RE | Admit: 2010-11-24 | Discharge: 2010-11-24 | Disposition: A | Discharge status: Home / Self Care | Payer: Medicaid Other | Source: Ambulatory Visit | Attending: Orthopaedic Surgery | Admitting: Orthopaedic Surgery

## 2010-11-24 ENCOUNTER — Other Ambulatory Visit (INDEPENDENT_AMBULATORY_CARE_PROVIDER_SITE_OTHER): Payer: Self-pay | Admitting: General Surgery

## 2010-11-24 VITALS — BP 140/85 | HR 87

## 2010-11-24 DIAGNOSIS — M549 Dorsalgia, unspecified: Secondary | ICD-10-CM

## 2010-11-24 DIAGNOSIS — M25551 Pain in right hip: Secondary | ICD-10-CM

## 2010-11-24 DIAGNOSIS — M79604 Pain in right leg: Secondary | ICD-10-CM

## 2010-11-24 DIAGNOSIS — C259 Malignant neoplasm of pancreas, unspecified: Secondary | ICD-10-CM

## 2010-11-26 ENCOUNTER — Other Ambulatory Visit: Payer: Self-pay | Admitting: Diagnostic Radiology

## 2010-11-26 ENCOUNTER — Ambulatory Visit (HOSPITAL_COMMUNITY)
Admission: RE | Admit: 2010-11-26 | Discharge: 2010-11-26 | Disposition: A | Discharge status: Home / Self Care | Payer: Medicaid Other | Source: Ambulatory Visit | Attending: General Surgery | Admitting: General Surgery

## 2010-11-26 DIAGNOSIS — C779 Secondary and unspecified malignant neoplasm of lymph node, unspecified: Secondary | ICD-10-CM | POA: Insufficient documentation

## 2010-11-26 DIAGNOSIS — C259 Malignant neoplasm of pancreas, unspecified: Secondary | ICD-10-CM | POA: Insufficient documentation

## 2010-11-26 DIAGNOSIS — C50919 Malignant neoplasm of unspecified site of unspecified female breast: Secondary | ICD-10-CM | POA: Insufficient documentation

## 2010-11-26 LAB — CBC
Hemoglobin: 13.6 g/dL (ref 13.0–17.0)
MCH: 29.5 pg (ref 26.0–34.0)
RBC: 4.61 MIL/uL (ref 4.22–5.81)

## 2010-11-26 LAB — PROTIME-INR: Prothrombin Time: 13.5 seconds (ref 11.6–15.2)

## 2010-12-03 ENCOUNTER — Encounter (INDEPENDENT_AMBULATORY_CARE_PROVIDER_SITE_OTHER): Payer: Self-pay | Admitting: General Surgery

## 2010-12-03 ENCOUNTER — Other Ambulatory Visit: Payer: Self-pay | Admitting: Oncology

## 2010-12-03 ENCOUNTER — Encounter (HOSPITAL_BASED_OUTPATIENT_CLINIC_OR_DEPARTMENT_OTHER): Payer: Medicaid Other | Admitting: Oncology

## 2010-12-03 DIAGNOSIS — R109 Unspecified abdominal pain: Secondary | ICD-10-CM

## 2010-12-03 DIAGNOSIS — C250 Malignant neoplasm of head of pancreas: Secondary | ICD-10-CM

## 2010-12-03 DIAGNOSIS — F17200 Nicotine dependence, unspecified, uncomplicated: Secondary | ICD-10-CM

## 2010-12-03 DIAGNOSIS — C259 Malignant neoplasm of pancreas, unspecified: Secondary | ICD-10-CM

## 2010-12-03 DIAGNOSIS — M519 Unspecified thoracic, thoracolumbar and lumbosacral intervertebral disc disorder: Secondary | ICD-10-CM

## 2010-12-03 LAB — CANCER ANTIGEN 19-9: CA 19-9: 15.7 U/mL (ref <35.0)

## 2010-12-03 LAB — CBC WITH DIFFERENTIAL/PLATELET
Eosinophils Absolute: 0 10*3/uL (ref 0.0–0.5)
LYMPH%: 44.1 % (ref 14.0–49.0)
MCHC: 33.2 g/dL (ref 32.0–36.0)
MCV: 86.8 fL (ref 79.3–98.0)
MONO%: 10.6 % (ref 0.0–14.0)
Platelets: 363 10*3/uL (ref 140–400)
RBC: 5 10*6/uL (ref 4.20–5.82)
nRBC: 0 % (ref 0–0)

## 2010-12-03 LAB — COMPREHENSIVE METABOLIC PANEL
ALT: 21 U/L (ref 0–53)
BUN: 10 mg/dL (ref 6–23)
CO2: 28 mEq/L (ref 19–32)
Creatinine, Ser: 0.95 mg/dL (ref 0.50–1.35)
Total Bilirubin: 0.2 mg/dL — ABNORMAL LOW (ref 0.3–1.2)

## 2010-12-03 LAB — CEA: CEA: 1.7 ng/mL (ref 0.0–5.0)

## 2010-12-08 ENCOUNTER — Encounter (HOSPITAL_COMMUNITY): Payer: Self-pay

## 2010-12-08 ENCOUNTER — Other Ambulatory Visit: Payer: Self-pay | Admitting: Oncology

## 2010-12-08 ENCOUNTER — Encounter (HOSPITAL_COMMUNITY)
Admission: RE | Admit: 2010-12-08 | Discharge: 2010-12-08 | Disposition: A | Discharge status: Home / Self Care | Payer: Medicaid Other | Source: Ambulatory Visit | Attending: Oncology | Admitting: Oncology

## 2010-12-08 DIAGNOSIS — C259 Malignant neoplasm of pancreas, unspecified: Secondary | ICD-10-CM

## 2010-12-08 DIAGNOSIS — C50919 Malignant neoplasm of unspecified site of unspecified female breast: Secondary | ICD-10-CM | POA: Insufficient documentation

## 2010-12-08 DIAGNOSIS — C786 Secondary malignant neoplasm of retroperitoneum and peritoneum: Secondary | ICD-10-CM | POA: Insufficient documentation

## 2010-12-08 DIAGNOSIS — K7689 Other specified diseases of liver: Secondary | ICD-10-CM | POA: Insufficient documentation

## 2010-12-08 LAB — GLUCOSE, CAPILLARY: Glucose-Capillary: 81 mg/dL (ref 70–99)

## 2010-12-08 MED ORDER — FLUDEOXYGLUCOSE F - 18 (FDG) INJECTION
16.7000 | Freq: Once | INTRAVENOUS | Status: AC | PRN
  Administered 2010-12-08: 16.7 via INTRAVENOUS

## 2010-12-09 ENCOUNTER — Encounter (HOSPITAL_BASED_OUTPATIENT_CLINIC_OR_DEPARTMENT_OTHER): Payer: Medicaid Other | Admitting: Oncology

## 2010-12-09 DIAGNOSIS — C250 Malignant neoplasm of head of pancreas: Secondary | ICD-10-CM

## 2010-12-15 ENCOUNTER — Other Ambulatory Visit: Payer: Self-pay | Admitting: Oncology

## 2010-12-15 ENCOUNTER — Encounter (HOSPITAL_BASED_OUTPATIENT_CLINIC_OR_DEPARTMENT_OTHER): Payer: Medicaid Other | Admitting: Oncology

## 2010-12-15 DIAGNOSIS — C250 Malignant neoplasm of head of pancreas: Secondary | ICD-10-CM

## 2010-12-15 DIAGNOSIS — C259 Malignant neoplasm of pancreas, unspecified: Secondary | ICD-10-CM

## 2010-12-16 ENCOUNTER — Other Ambulatory Visit: Payer: Self-pay | Admitting: Oncology

## 2010-12-16 ENCOUNTER — Encounter (HOSPITAL_BASED_OUTPATIENT_CLINIC_OR_DEPARTMENT_OTHER): Payer: Medicaid Other | Admitting: Oncology

## 2010-12-16 DIAGNOSIS — C259 Malignant neoplasm of pancreas, unspecified: Secondary | ICD-10-CM

## 2010-12-16 DIAGNOSIS — M519 Unspecified thoracic, thoracolumbar and lumbosacral intervertebral disc disorder: Secondary | ICD-10-CM

## 2010-12-16 DIAGNOSIS — F17200 Nicotine dependence, unspecified, uncomplicated: Secondary | ICD-10-CM

## 2010-12-16 LAB — COMPREHENSIVE METABOLIC PANEL
ALT: 25 U/L (ref 0–53)
AST: 36 U/L (ref 0–37)
CO2: 22 mEq/L (ref 19–32)
Calcium: 9.5 mg/dL (ref 8.4–10.5)
Chloride: 104 mEq/L (ref 96–112)
Potassium: 4.4 mEq/L (ref 3.5–5.3)
Sodium: 139 mEq/L (ref 135–145)
Total Protein: 7.4 g/dL (ref 6.0–8.3)

## 2010-12-16 LAB — CBC WITH DIFFERENTIAL/PLATELET
Eosinophils Absolute: 0.1 10*3/uL (ref 0.0–0.5)
MONO#: 0.5 10*3/uL (ref 0.1–0.9)
NEUT#: 2 10*3/uL (ref 1.5–6.5)
Platelets: 293 10*3/uL (ref 140–400)
RBC: 4.73 10*6/uL (ref 4.20–5.82)
RDW: 13.6 % (ref 11.0–14.6)
WBC: 4.6 10*3/uL (ref 4.0–10.3)
lymph#: 2.1 10*3/uL (ref 0.9–3.3)
nRBC: 0 % (ref 0–0)

## 2010-12-16 LAB — BILIRUBIN, DIRECT: Bilirubin, Direct: 0.1 mg/dL (ref 0.0–0.3)

## 2010-12-16 LAB — LIPID PANEL: LDL Cholesterol: 51 mg/dL (ref 0–99)

## 2010-12-16 LAB — CANCER ANTIGEN 19-9: CA 19-9: 16.9 U/mL (ref <35.0)

## 2010-12-17 ENCOUNTER — Ambulatory Visit (HOSPITAL_COMMUNITY)
Admission: RE | Admit: 2010-12-17 | Discharge: 2010-12-17 | Disposition: A | Discharge status: Home / Self Care | Payer: Medicaid Other | Source: Ambulatory Visit | Attending: Oncology | Admitting: Oncology

## 2010-12-17 ENCOUNTER — Other Ambulatory Visit (HOSPITAL_COMMUNITY): Payer: Medicaid Other

## 2010-12-17 DIAGNOSIS — C779 Secondary and unspecified malignant neoplasm of lymph node, unspecified: Secondary | ICD-10-CM | POA: Insufficient documentation

## 2010-12-17 DIAGNOSIS — C50919 Malignant neoplasm of unspecified site of unspecified female breast: Secondary | ICD-10-CM | POA: Insufficient documentation

## 2010-12-17 DIAGNOSIS — C259 Malignant neoplasm of pancreas, unspecified: Secondary | ICD-10-CM | POA: Insufficient documentation

## 2010-12-17 MED ORDER — IOHEXOL 300 MG/ML  SOLN
100.0000 mL | Freq: Once | INTRAMUSCULAR | Status: AC | PRN
  Administered 2010-12-17: 100 mL via INTRAVENOUS

## 2010-12-21 ENCOUNTER — Other Ambulatory Visit: Payer: Self-pay | Admitting: Oncology

## 2010-12-21 ENCOUNTER — Other Ambulatory Visit (HOSPITAL_COMMUNITY): Payer: Medicaid Other

## 2010-12-21 ENCOUNTER — Encounter (HOSPITAL_BASED_OUTPATIENT_CLINIC_OR_DEPARTMENT_OTHER): Payer: Medicaid Other | Admitting: Oncology

## 2010-12-21 DIAGNOSIS — C250 Malignant neoplasm of head of pancreas: Secondary | ICD-10-CM

## 2010-12-21 DIAGNOSIS — C259 Malignant neoplasm of pancreas, unspecified: Secondary | ICD-10-CM

## 2010-12-21 DIAGNOSIS — M519 Unspecified thoracic, thoracolumbar and lumbosacral intervertebral disc disorder: Secondary | ICD-10-CM

## 2010-12-21 DIAGNOSIS — F17200 Nicotine dependence, unspecified, uncomplicated: Secondary | ICD-10-CM

## 2010-12-21 DIAGNOSIS — Z5111 Encounter for antineoplastic chemotherapy: Secondary | ICD-10-CM

## 2010-12-21 LAB — LIPID PANEL
Cholesterol: 109 mg/dL (ref 0–200)
HDL: 52 mg/dL (ref >39)
LDL Cholesterol: 44 mg/dL (ref 0–99)
Triglycerides: 66 mg/dL (ref <150)
VLDL: 13 mg/dL (ref 0–40)

## 2010-12-21 LAB — CBC WITH DIFFERENTIAL/PLATELET
BASO%: 1.1 % (ref 0.0–2.0)
Basophils Absolute: 0.1 10*3/uL (ref 0.0–0.1)
Eosinophils Absolute: 0.1 10*3/uL (ref 0.0–0.5)
HCT: 43.3 % (ref 38.4–49.9)
HGB: 14.4 g/dL (ref 13.0–17.1)
LYMPH%: 41.2 % (ref 14.0–49.0)
MONO#: 0.3 10*3/uL (ref 0.1–0.9)
NEUT#: 2.2 10*3/uL (ref 1.5–6.5)
NEUT%: 47.9 % (ref 39.0–75.0)
Platelets: 272 10*3/uL (ref 140–400)
WBC: 4.7 10*3/uL (ref 4.0–10.3)
lymph#: 1.9 10*3/uL (ref 0.9–3.3)

## 2010-12-21 LAB — URINALYSIS, MICROSCOPIC - CHCC
Leukocyte Esterase: NEGATIVE
Nitrite: NEGATIVE
Protein: NEGATIVE mg/dL
pH: 6 (ref 4.6–8.0)

## 2010-12-21 LAB — LACTATE DEHYDROGENASE: LDH: 219 U/L (ref 94–250)

## 2010-12-21 LAB — COMPREHENSIVE METABOLIC PANEL
Alkaline Phosphatase: 77 U/L (ref 39–117)
BUN: 7 mg/dL (ref 6–23)
Glucose, Bld: 118 mg/dL — ABNORMAL HIGH (ref 70–99)
Total Bilirubin: 0.2 mg/dL — ABNORMAL LOW (ref 0.3–1.2)

## 2010-12-21 LAB — PHOSPHORUS: Phosphorus: 4.5 mg/dL (ref 2.3–4.6)

## 2010-12-28 ENCOUNTER — Encounter (HOSPITAL_BASED_OUTPATIENT_CLINIC_OR_DEPARTMENT_OTHER): Payer: Medicaid Other | Admitting: Oncology

## 2010-12-28 ENCOUNTER — Other Ambulatory Visit: Payer: Self-pay | Admitting: Oncology

## 2010-12-28 DIAGNOSIS — Z5111 Encounter for antineoplastic chemotherapy: Secondary | ICD-10-CM

## 2010-12-28 DIAGNOSIS — C250 Malignant neoplasm of head of pancreas: Secondary | ICD-10-CM

## 2010-12-28 DIAGNOSIS — M519 Unspecified thoracic, thoracolumbar and lumbosacral intervertebral disc disorder: Secondary | ICD-10-CM

## 2010-12-28 DIAGNOSIS — F17200 Nicotine dependence, unspecified, uncomplicated: Secondary | ICD-10-CM

## 2010-12-28 DIAGNOSIS — C259 Malignant neoplasm of pancreas, unspecified: Secondary | ICD-10-CM

## 2010-12-28 LAB — CBC WITH DIFFERENTIAL/PLATELET
BASO%: 0.2 % (ref 0.0–2.0)
Basophils Absolute: 0 10*3/uL (ref 0.0–0.1)
EOS%: 0.5 % (ref 0.0–7.0)
HCT: 40.3 % (ref 38.4–49.9)
HGB: 13.4 g/dL (ref 13.0–17.1)
MCH: 29.1 pg (ref 27.2–33.4)
MCHC: 33.3 g/dL (ref 32.0–36.0)
MCV: 87.4 fL (ref 79.3–98.0)
MONO%: 5.3 % (ref 0.0–14.0)
NEUT%: 35.9 % — ABNORMAL LOW (ref 39.0–75.0)
RDW: 13.1 % (ref 11.0–14.6)
lymph#: 2.5 10*3/uL (ref 0.9–3.3)

## 2010-12-29 ENCOUNTER — Other Ambulatory Visit: Payer: Self-pay | Admitting: Oncology

## 2010-12-29 DIAGNOSIS — C259 Malignant neoplasm of pancreas, unspecified: Secondary | ICD-10-CM

## 2011-01-04 ENCOUNTER — Encounter (HOSPITAL_BASED_OUTPATIENT_CLINIC_OR_DEPARTMENT_OTHER): Payer: Medicaid Other | Admitting: Oncology

## 2011-01-04 ENCOUNTER — Other Ambulatory Visit: Payer: Self-pay | Admitting: Oncology

## 2011-01-04 DIAGNOSIS — M519 Unspecified thoracic, thoracolumbar and lumbosacral intervertebral disc disorder: Secondary | ICD-10-CM

## 2011-01-04 DIAGNOSIS — C259 Malignant neoplasm of pancreas, unspecified: Secondary | ICD-10-CM

## 2011-01-04 DIAGNOSIS — F17200 Nicotine dependence, unspecified, uncomplicated: Secondary | ICD-10-CM

## 2011-01-04 DIAGNOSIS — C250 Malignant neoplasm of head of pancreas: Secondary | ICD-10-CM

## 2011-01-04 DIAGNOSIS — Z5111 Encounter for antineoplastic chemotherapy: Secondary | ICD-10-CM

## 2011-01-04 LAB — CBC WITH DIFFERENTIAL/PLATELET
BASO%: 0.6 % (ref 0.0–2.0)
EOS%: 0.2 % (ref 0.0–7.0)
Eosinophils Absolute: 0 10*3/uL (ref 0.0–0.5)
LYMPH%: 58 % — ABNORMAL HIGH (ref 14.0–49.0)
MCHC: 33.6 g/dL (ref 32.0–36.0)
MCV: 88.3 fL (ref 79.3–98.0)
MONO%: 7.7 % (ref 0.0–14.0)
NEUT#: 1 10*3/uL — ABNORMAL LOW (ref 1.5–6.5)
Platelets: 130 10*3/uL — ABNORMAL LOW (ref 140–400)
RBC: 4.5 10*6/uL (ref 4.20–5.82)
RDW: 13.6 % (ref 11.0–14.6)

## 2011-01-05 ENCOUNTER — Encounter (HOSPITAL_BASED_OUTPATIENT_CLINIC_OR_DEPARTMENT_OTHER): Payer: Medicaid Other | Admitting: Oncology

## 2011-01-05 DIAGNOSIS — C250 Malignant neoplasm of head of pancreas: Secondary | ICD-10-CM

## 2011-01-06 ENCOUNTER — Ambulatory Visit (HOSPITAL_COMMUNITY)
Admission: RE | Admit: 2011-01-06 | Discharge: 2011-01-06 | Disposition: A | Discharge status: Home / Self Care | Payer: Medicaid Other | Source: Ambulatory Visit | Attending: Oncology | Admitting: Oncology

## 2011-01-06 ENCOUNTER — Ambulatory Visit (HOSPITAL_COMMUNITY): Payer: Medicaid Other

## 2011-01-06 ENCOUNTER — Other Ambulatory Visit: Payer: Self-pay | Admitting: Oncology

## 2011-01-06 DIAGNOSIS — I10 Essential (primary) hypertension: Secondary | ICD-10-CM | POA: Insufficient documentation

## 2011-01-06 DIAGNOSIS — C259 Malignant neoplasm of pancreas, unspecified: Secondary | ICD-10-CM

## 2011-01-06 DIAGNOSIS — G8929 Other chronic pain: Secondary | ICD-10-CM | POA: Insufficient documentation

## 2011-01-06 DIAGNOSIS — M545 Low back pain, unspecified: Secondary | ICD-10-CM | POA: Insufficient documentation

## 2011-01-06 DIAGNOSIS — F17200 Nicotine dependence, unspecified, uncomplicated: Secondary | ICD-10-CM | POA: Insufficient documentation

## 2011-01-06 DIAGNOSIS — M79609 Pain in unspecified limb: Secondary | ICD-10-CM | POA: Insufficient documentation

## 2011-01-06 HISTORY — PX: PORTACATH PLACEMENT: SHX2246

## 2011-01-12 ENCOUNTER — Encounter (HOSPITAL_BASED_OUTPATIENT_CLINIC_OR_DEPARTMENT_OTHER): Payer: Medicaid Other | Admitting: Oncology

## 2011-01-12 ENCOUNTER — Other Ambulatory Visit: Payer: Self-pay | Admitting: Oncology

## 2011-01-12 DIAGNOSIS — C259 Malignant neoplasm of pancreas, unspecified: Secondary | ICD-10-CM

## 2011-01-12 DIAGNOSIS — F17200 Nicotine dependence, unspecified, uncomplicated: Secondary | ICD-10-CM

## 2011-01-12 DIAGNOSIS — M519 Unspecified thoracic, thoracolumbar and lumbosacral intervertebral disc disorder: Secondary | ICD-10-CM

## 2011-01-12 LAB — CBC WITH DIFFERENTIAL/PLATELET
BASO%: 0.2 % (ref 0.0–2.0)
EOS%: 0.1 % (ref 0.0–7.0)
HCT: 37 % — ABNORMAL LOW (ref 38.4–49.9)
MCH: 29.3 pg (ref 27.2–33.4)
MCHC: 34.1 g/dL (ref 32.0–36.0)
NEUT%: 77.1 % — ABNORMAL HIGH (ref 39.0–75.0)
RDW: 14.6 % (ref 11.0–14.6)
lymph#: 4 10*3/uL — ABNORMAL HIGH (ref 0.9–3.3)

## 2011-01-13 HISTORY — PX: OTHER SURGICAL HISTORY: SHX169

## 2011-01-16 ENCOUNTER — Emergency Department (HOSPITAL_COMMUNITY)
Admission: EM | Admit: 2011-01-16 | Discharge: 2011-01-16 | Disposition: A | Discharge status: Home / Self Care | Payer: Medicaid Other | Attending: Emergency Medicine | Admitting: Emergency Medicine

## 2011-01-16 DIAGNOSIS — R109 Unspecified abdominal pain: Secondary | ICD-10-CM | POA: Insufficient documentation

## 2011-01-16 DIAGNOSIS — I10 Essential (primary) hypertension: Secondary | ICD-10-CM | POA: Insufficient documentation

## 2011-01-16 DIAGNOSIS — Z8509 Personal history of malignant neoplasm of other digestive organs: Secondary | ICD-10-CM | POA: Insufficient documentation

## 2011-01-16 DIAGNOSIS — Z79899 Other long term (current) drug therapy: Secondary | ICD-10-CM | POA: Insufficient documentation

## 2011-01-16 DIAGNOSIS — R10819 Abdominal tenderness, unspecified site: Secondary | ICD-10-CM | POA: Insufficient documentation

## 2011-01-18 ENCOUNTER — Other Ambulatory Visit: Payer: Self-pay | Admitting: Oncology

## 2011-01-18 ENCOUNTER — Encounter (HOSPITAL_BASED_OUTPATIENT_CLINIC_OR_DEPARTMENT_OTHER): Payer: Medicaid Other | Admitting: Oncology

## 2011-01-18 DIAGNOSIS — Z5111 Encounter for antineoplastic chemotherapy: Secondary | ICD-10-CM

## 2011-01-18 DIAGNOSIS — F17200 Nicotine dependence, unspecified, uncomplicated: Secondary | ICD-10-CM

## 2011-01-18 DIAGNOSIS — C250 Malignant neoplasm of head of pancreas: Secondary | ICD-10-CM

## 2011-01-18 DIAGNOSIS — C259 Malignant neoplasm of pancreas, unspecified: Secondary | ICD-10-CM

## 2011-01-18 DIAGNOSIS — M519 Unspecified thoracic, thoracolumbar and lumbosacral intervertebral disc disorder: Secondary | ICD-10-CM

## 2011-01-18 LAB — CBC WITH DIFFERENTIAL/PLATELET
BASO%: 0.4 % (ref 0.0–2.0)
EOS%: 0.4 % (ref 0.0–7.0)
Eosinophils Absolute: 0.1 10*3/uL (ref 0.0–0.5)
MCH: 30.4 pg (ref 27.2–33.4)
MCV: 90.5 fL (ref 79.3–98.0)
MONO%: 11.7 % (ref 0.0–14.0)
NEUT#: 11.8 10*3/uL — ABNORMAL HIGH (ref 1.5–6.5)
RBC: 4.51 10*6/uL (ref 4.20–5.82)
RDW: 16 % — ABNORMAL HIGH (ref 11.0–14.6)

## 2011-01-18 LAB — LIPID PANEL
Cholesterol: 102 mg/dL (ref 0–200)
LDL Cholesterol: 45 mg/dL (ref 0–99)
Triglycerides: 97 mg/dL (ref <150)

## 2011-01-18 LAB — URINALYSIS, MICROSCOPIC - CHCC
Bilirubin (Urine): NEGATIVE
Glucose: NEGATIVE g/dL
Leukocyte Esterase: NEGATIVE

## 2011-01-18 LAB — PHOSPHORUS: Phosphorus: 4.1 mg/dL (ref 2.3–4.6)

## 2011-01-18 LAB — COMPREHENSIVE METABOLIC PANEL
Alkaline Phosphatase: 165 U/L — ABNORMAL HIGH (ref 39–117)
BUN: 10 mg/dL (ref 6–23)
Glucose, Bld: 63 mg/dL — ABNORMAL LOW (ref 70–99)
Total Bilirubin: 0.1 mg/dL — ABNORMAL LOW (ref 0.3–1.2)

## 2011-01-18 LAB — BILIRUBIN, DIRECT: Bilirubin, Direct: 0.1 mg/dL (ref 0.0–0.3)

## 2011-01-18 LAB — LACTATE DEHYDROGENASE: LDH: 379 U/L — ABNORMAL HIGH (ref 94–250)

## 2011-01-24 ENCOUNTER — Encounter (HOSPITAL_BASED_OUTPATIENT_CLINIC_OR_DEPARTMENT_OTHER): Payer: Medicaid Other | Admitting: Oncology

## 2011-01-26 ENCOUNTER — Telehealth (INDEPENDENT_AMBULATORY_CARE_PROVIDER_SITE_OTHER): Payer: Self-pay | Admitting: General Surgery

## 2011-01-31 ENCOUNTER — Ambulatory Visit (HOSPITAL_COMMUNITY)
Admission: RE | Admit: 2011-01-31 | Discharge: 2011-01-31 | Disposition: A | Discharge status: Home / Self Care | Payer: Medicaid Other | Source: Ambulatory Visit | Attending: Oncology | Admitting: Oncology

## 2011-01-31 DIAGNOSIS — C786 Secondary malignant neoplasm of retroperitoneum and peritoneum: Secondary | ICD-10-CM | POA: Insufficient documentation

## 2011-01-31 DIAGNOSIS — C259 Malignant neoplasm of pancreas, unspecified: Secondary | ICD-10-CM | POA: Insufficient documentation

## 2011-01-31 DIAGNOSIS — C50919 Malignant neoplasm of unspecified site of unspecified female breast: Secondary | ICD-10-CM | POA: Insufficient documentation

## 2011-01-31 DIAGNOSIS — C787 Secondary malignant neoplasm of liver and intrahepatic bile duct: Secondary | ICD-10-CM | POA: Insufficient documentation

## 2011-01-31 MED ORDER — IOHEXOL 300 MG/ML  SOLN
100.0000 mL | Freq: Once | INTRAMUSCULAR | Status: AC | PRN
  Administered 2011-01-31: 100 mL via INTRAVENOUS

## 2011-02-01 ENCOUNTER — Encounter (HOSPITAL_BASED_OUTPATIENT_CLINIC_OR_DEPARTMENT_OTHER): Payer: Medicaid Other | Admitting: Oncology

## 2011-02-01 ENCOUNTER — Other Ambulatory Visit: Payer: Self-pay | Admitting: Oncology

## 2011-02-01 DIAGNOSIS — M519 Unspecified thoracic, thoracolumbar and lumbosacral intervertebral disc disorder: Secondary | ICD-10-CM

## 2011-02-01 DIAGNOSIS — C786 Secondary malignant neoplasm of retroperitoneum and peritoneum: Secondary | ICD-10-CM

## 2011-02-01 DIAGNOSIS — R109 Unspecified abdominal pain: Secondary | ICD-10-CM

## 2011-02-01 DIAGNOSIS — C259 Malignant neoplasm of pancreas, unspecified: Secondary | ICD-10-CM

## 2011-02-01 DIAGNOSIS — F17200 Nicotine dependence, unspecified, uncomplicated: Secondary | ICD-10-CM

## 2011-02-01 DIAGNOSIS — C779 Secondary and unspecified malignant neoplasm of lymph node, unspecified: Secondary | ICD-10-CM

## 2011-02-01 DIAGNOSIS — M549 Dorsalgia, unspecified: Secondary | ICD-10-CM

## 2011-02-01 DIAGNOSIS — G8929 Other chronic pain: Secondary | ICD-10-CM

## 2011-02-01 LAB — COMPREHENSIVE METABOLIC PANEL
ALT: 18 U/L (ref 0–53)
AST: 26 U/L (ref 0–37)
Albumin: 3.3 g/dL — ABNORMAL LOW (ref 3.5–5.2)
Alkaline Phosphatase: 108 U/L (ref 39–117)
Calcium: 9.2 mg/dL (ref 8.4–10.5)
Chloride: 95 mEq/L — ABNORMAL LOW (ref 96–112)
Potassium: 3.9 mEq/L (ref 3.5–5.3)
Sodium: 133 mEq/L — ABNORMAL LOW (ref 135–145)
Total Protein: 8.2 g/dL (ref 6.0–8.3)

## 2011-02-01 LAB — URINALYSIS, MICROSCOPIC - CHCC
Bilirubin (Urine): NEGATIVE
Glucose: NEGATIVE g/dL
Specific Gravity, Urine: 1.02 (ref 1.003–1.035)
pH: 6 (ref 4.6–8.0)

## 2011-02-01 LAB — CBC WITH DIFFERENTIAL/PLATELET
Basophils Absolute: 0 10*3/uL (ref 0.0–0.1)
EOS%: 1.9 % (ref 0.0–7.0)
Eosinophils Absolute: 0.1 10*3/uL (ref 0.0–0.5)
HCT: 38 % — ABNORMAL LOW (ref 38.4–49.9)
HGB: 12.8 g/dL — ABNORMAL LOW (ref 13.0–17.1)
MCH: 29 pg (ref 27.2–33.4)
MCV: 86.2 fL (ref 79.3–98.0)
NEUT#: 4.2 10*3/uL (ref 1.5–6.5)
NEUT%: 61.4 % (ref 39.0–75.0)
lymph#: 1.8 10*3/uL (ref 0.9–3.3)

## 2011-02-01 LAB — CANCER ANTIGEN 19-9: CA 19-9: 16.3 U/mL (ref <35.0)

## 2011-02-01 LAB — LIPID PANEL
HDL: 35 mg/dL — ABNORMAL LOW (ref >39)
LDL Cholesterol: 48 mg/dL (ref 0–99)
Total CHOL/HDL Ratio: 2.7 Ratio
Triglycerides: 63 mg/dL (ref <150)

## 2011-02-01 LAB — PHOSPHORUS: Phosphorus: 3.5 mg/dL (ref 2.3–4.6)

## 2011-02-02 ENCOUNTER — Ambulatory Visit
Admission: RE | Admit: 2011-02-02 | Discharge: 2011-02-02 | Disposition: A | Discharge status: Home / Self Care | Payer: Medicaid Other | Source: Ambulatory Visit | Attending: Radiation Oncology | Admitting: Radiation Oncology

## 2011-02-02 DIAGNOSIS — Z510 Encounter for antineoplastic radiation therapy: Secondary | ICD-10-CM | POA: Insufficient documentation

## 2011-02-02 DIAGNOSIS — C779 Secondary and unspecified malignant neoplasm of lymph node, unspecified: Secondary | ICD-10-CM | POA: Insufficient documentation

## 2011-02-02 DIAGNOSIS — R110 Nausea: Secondary | ICD-10-CM | POA: Insufficient documentation

## 2011-02-02 DIAGNOSIS — C787 Secondary malignant neoplasm of liver and intrahepatic bile duct: Secondary | ICD-10-CM | POA: Insufficient documentation

## 2011-02-02 DIAGNOSIS — C50919 Malignant neoplasm of unspecified site of unspecified female breast: Secondary | ICD-10-CM | POA: Insufficient documentation

## 2011-02-02 DIAGNOSIS — C786 Secondary malignant neoplasm of retroperitoneum and peritoneum: Secondary | ICD-10-CM | POA: Insufficient documentation

## 2011-02-02 DIAGNOSIS — C259 Malignant neoplasm of pancreas, unspecified: Secondary | ICD-10-CM | POA: Insufficient documentation

## 2011-02-02 DIAGNOSIS — Y842 Radiological procedure and radiotherapy as the cause of abnormal reaction of the patient, or of later complication, without mention of misadventure at the time of the procedure: Secondary | ICD-10-CM | POA: Insufficient documentation

## 2011-02-04 ENCOUNTER — Ambulatory Visit (INDEPENDENT_AMBULATORY_CARE_PROVIDER_SITE_OTHER): Payer: Self-pay | Admitting: General Surgery

## 2011-02-04 NOTE — Telephone Encounter (Signed)
Encounter resolved.

## 2011-02-11 ENCOUNTER — Encounter (HOSPITAL_BASED_OUTPATIENT_CLINIC_OR_DEPARTMENT_OTHER): Payer: Medicaid Other | Admitting: Oncology

## 2011-02-11 ENCOUNTER — Other Ambulatory Visit: Payer: Self-pay | Admitting: Oncology

## 2011-02-11 ENCOUNTER — Other Ambulatory Visit (HOSPITAL_COMMUNITY): Payer: Medicaid Other

## 2011-02-11 DIAGNOSIS — R109 Unspecified abdominal pain: Secondary | ICD-10-CM

## 2011-02-11 DIAGNOSIS — C50919 Malignant neoplasm of unspecified site of unspecified female breast: Secondary | ICD-10-CM

## 2011-02-11 DIAGNOSIS — M549 Dorsalgia, unspecified: Secondary | ICD-10-CM

## 2011-02-11 DIAGNOSIS — C259 Malignant neoplasm of pancreas, unspecified: Secondary | ICD-10-CM

## 2011-02-11 LAB — URINALYSIS, MICROSCOPIC - CHCC
Ketones: NEGATIVE mg/dL
Nitrite: NEGATIVE
Protein: 30 mg/dL
pH: 6 (ref 4.6–8.0)

## 2011-02-13 LAB — URINE CULTURE

## 2011-02-25 ENCOUNTER — Encounter (HOSPITAL_BASED_OUTPATIENT_CLINIC_OR_DEPARTMENT_OTHER): Payer: Medicaid Other | Admitting: Oncology

## 2011-02-25 DIAGNOSIS — C779 Secondary and unspecified malignant neoplasm of lymph node, unspecified: Secondary | ICD-10-CM

## 2011-02-25 DIAGNOSIS — C259 Malignant neoplasm of pancreas, unspecified: Secondary | ICD-10-CM

## 2011-02-25 DIAGNOSIS — R109 Unspecified abdominal pain: Secondary | ICD-10-CM

## 2011-02-25 DIAGNOSIS — M549 Dorsalgia, unspecified: Secondary | ICD-10-CM

## 2011-02-28 ENCOUNTER — Ambulatory Visit (INDEPENDENT_AMBULATORY_CARE_PROVIDER_SITE_OTHER): Payer: Medicaid Other | Admitting: General Surgery

## 2011-02-28 ENCOUNTER — Encounter (INDEPENDENT_AMBULATORY_CARE_PROVIDER_SITE_OTHER): Payer: Self-pay | Admitting: General Surgery

## 2011-02-28 VITALS — BP 128/90 | HR 80 | Temp 97.3°F | Resp 20 | Ht 73.0 in | Wt 161.5 lb

## 2011-02-28 DIAGNOSIS — C259 Malignant neoplasm of pancreas, unspecified: Secondary | ICD-10-CM | POA: Insufficient documentation

## 2011-02-28 NOTE — Progress Notes (Signed)
HISTORY: Pt is continuing to lose weight.  He is undergoing chemo.  He is very angry about his recurrent cancer.  He wonders why he did not get chemo and radiation up front.  He got sick in July and was found to have recurrent cancer.  He denies vomiting.  He has been now on FOLFOX and is tolerating it.  He has been calling to different cancer treatment centers of [place] and has been told that he should have been getting it.  He reports rash, diarrhea, and nausea.  He received radiation to the abdominal wall mass.    PERTINENT REVIEW OF SYSTEMS: Still has some joint pain.     EXAM: Head: Normocephalic and atraumatic.  Eyes:  Conjunctivae are normal. Pupils are equal, round, and reactive to light. No scleral icterus.  Neck:  Normal range of motion. Neck supple. No tracheal deviation present. No thyromegaly present.  Resp: No respiratory distress, normal effort. Abd:  Abdomen is soft, non distended and non tender. No masses are palpable.  There is no rebound and no guarding.  Neurological: Alert and oriented to person, place, and time. Coordination normal.  Skin: Skin is warm and dry. No rash noted. No diaphoretic. No erythema. No pallor.  Psychiatric: Normal mood and affect. Normal behavior. Judgment and thought content normal.      ASSESSMENT AND PLAN:   Pancreatic cancer, s/p whipple 02/09/2010, T2N0 Pt with widespread recurrence in abdomen. Getting FOLFOX with Dr. Truett Perna.      Follow up as needed.    Maudry Diego, MD Surgical Oncology, General & Endocrine Surgery [facility]  No primary provider on file. Lynne Leader, MD

## 2011-02-28 NOTE — Patient Instructions (Signed)
Follow up as scheduled with Dr. Truett Perna.

## 2011-02-28 NOTE — Assessment & Plan Note (Signed)
Pt with widespread recurrence in abdomen. Getting FOLFOX with Dr. Truett Perna.

## 2011-03-02 ENCOUNTER — Other Ambulatory Visit: Payer: Self-pay | Admitting: Oncology

## 2011-03-02 ENCOUNTER — Encounter (HOSPITAL_BASED_OUTPATIENT_CLINIC_OR_DEPARTMENT_OTHER): Payer: Medicaid Other | Admitting: Oncology

## 2011-03-02 DIAGNOSIS — R109 Unspecified abdominal pain: Secondary | ICD-10-CM

## 2011-03-02 DIAGNOSIS — C259 Malignant neoplasm of pancreas, unspecified: Secondary | ICD-10-CM

## 2011-03-02 DIAGNOSIS — C50919 Malignant neoplasm of unspecified site of unspecified female breast: Secondary | ICD-10-CM

## 2011-03-02 DIAGNOSIS — M549 Dorsalgia, unspecified: Secondary | ICD-10-CM

## 2011-03-02 LAB — CBC WITH DIFFERENTIAL/PLATELET
BASO%: 1.1 % (ref 0.0–2.0)
Eosinophils Absolute: 0.2 10*3/uL (ref 0.0–0.5)
HCT: 39.6 % (ref 38.4–49.9)
LYMPH%: 23.6 % (ref 14.0–49.0)
MCHC: 32.8 g/dL (ref 32.0–36.0)
MONO#: 0.8 10*3/uL (ref 0.1–0.9)
NEUT#: 2.3 10*3/uL (ref 1.5–6.5)
Platelets: 39 10*3/uL — ABNORMAL LOW (ref 140–400)
RBC: 4.54 10*6/uL (ref 4.20–5.82)
WBC: 4.4 10*3/uL (ref 4.0–10.3)
lymph#: 1 10*3/uL (ref 0.9–3.3)
nRBC: 0 % (ref 0–0)

## 2011-03-09 ENCOUNTER — Other Ambulatory Visit: Payer: Self-pay | Admitting: Oncology

## 2011-03-09 ENCOUNTER — Encounter: Payer: Self-pay | Admitting: *Deleted

## 2011-03-09 ENCOUNTER — Encounter (HOSPITAL_BASED_OUTPATIENT_CLINIC_OR_DEPARTMENT_OTHER): Payer: Medicaid Other | Admitting: Oncology

## 2011-03-09 DIAGNOSIS — C259 Malignant neoplasm of pancreas, unspecified: Secondary | ICD-10-CM

## 2011-03-09 DIAGNOSIS — M549 Dorsalgia, unspecified: Secondary | ICD-10-CM

## 2011-03-09 DIAGNOSIS — R109 Unspecified abdominal pain: Secondary | ICD-10-CM

## 2011-03-09 DIAGNOSIS — C250 Malignant neoplasm of head of pancreas: Secondary | ICD-10-CM

## 2011-03-09 DIAGNOSIS — Z5111 Encounter for antineoplastic chemotherapy: Secondary | ICD-10-CM

## 2011-03-09 DIAGNOSIS — C779 Secondary and unspecified malignant neoplasm of lymph node, unspecified: Secondary | ICD-10-CM

## 2011-03-09 LAB — COMPREHENSIVE METABOLIC PANEL
Albumin: 3.6 g/dL (ref 3.5–5.2)
Alkaline Phosphatase: 99 U/L (ref 39–117)
BUN: 9 mg/dL (ref 6–23)
CO2: 26 mEq/L (ref 19–32)
Calcium: 8.6 mg/dL (ref 8.4–10.5)
Chloride: 101 mEq/L (ref 96–112)
Glucose, Bld: 98 mg/dL (ref 70–99)
Potassium: 4.3 mEq/L (ref 3.5–5.3)
Sodium: 137 mEq/L (ref 135–145)
Total Protein: 7.1 g/dL (ref 6.0–8.3)

## 2011-03-09 LAB — CBC WITH DIFFERENTIAL/PLATELET
Basophils Absolute: 0 10*3/uL (ref 0.0–0.1)
Eosinophils Absolute: 0.2 10*3/uL (ref 0.0–0.5)
HCT: 39.7 % (ref 38.4–49.9)
HGB: 13.1 g/dL (ref 13.0–17.1)
MCH: 28.5 pg (ref 27.2–33.4)
MONO#: 0.6 10*3/uL (ref 0.1–0.9)
NEUT#: 1.5 10*3/uL (ref 1.5–6.5)
NEUT%: 39.2 % (ref 39.0–75.0)
RDW: 15.1 % — ABNORMAL HIGH (ref 11.0–14.6)
lymph#: 1.5 10*3/uL (ref 0.9–3.3)

## 2011-03-10 ENCOUNTER — Encounter: Payer: Self-pay | Admitting: *Deleted

## 2011-03-11 ENCOUNTER — Encounter (HOSPITAL_BASED_OUTPATIENT_CLINIC_OR_DEPARTMENT_OTHER): Payer: Medicaid Other | Admitting: Oncology

## 2011-03-11 DIAGNOSIS — C250 Malignant neoplasm of head of pancreas: Secondary | ICD-10-CM

## 2011-03-17 ENCOUNTER — Other Ambulatory Visit: Payer: Self-pay | Admitting: Hematology and Oncology

## 2011-03-18 ENCOUNTER — Other Ambulatory Visit: Payer: Self-pay | Admitting: Hematology and Oncology

## 2011-03-23 ENCOUNTER — Other Ambulatory Visit: Payer: Self-pay | Admitting: Oncology

## 2011-03-23 ENCOUNTER — Inpatient Hospital Stay: Payer: Medicaid Other

## 2011-03-23 ENCOUNTER — Ambulatory Visit (HOSPITAL_BASED_OUTPATIENT_CLINIC_OR_DEPARTMENT_OTHER): Payer: Medicaid Other | Admitting: Hematology and Oncology

## 2011-03-23 ENCOUNTER — Other Ambulatory Visit (HOSPITAL_BASED_OUTPATIENT_CLINIC_OR_DEPARTMENT_OTHER): Payer: Medicaid Other

## 2011-03-23 VITALS — BP 116/73 | HR 75 | Temp 98.5°F | Ht 73.0 in | Wt 148.4 lb

## 2011-03-23 DIAGNOSIS — C779 Secondary and unspecified malignant neoplasm of lymph node, unspecified: Secondary | ICD-10-CM

## 2011-03-23 DIAGNOSIS — M549 Dorsalgia, unspecified: Secondary | ICD-10-CM

## 2011-03-23 DIAGNOSIS — G893 Neoplasm related pain (acute) (chronic): Secondary | ICD-10-CM

## 2011-03-23 DIAGNOSIS — C786 Secondary malignant neoplasm of retroperitoneum and peritoneum: Secondary | ICD-10-CM

## 2011-03-23 DIAGNOSIS — C250 Malignant neoplasm of head of pancreas: Secondary | ICD-10-CM

## 2011-03-23 DIAGNOSIS — C50919 Malignant neoplasm of unspecified site of unspecified female breast: Secondary | ICD-10-CM

## 2011-03-23 DIAGNOSIS — R109 Unspecified abdominal pain: Secondary | ICD-10-CM

## 2011-03-23 DIAGNOSIS — R110 Nausea: Secondary | ICD-10-CM

## 2011-03-23 DIAGNOSIS — C259 Malignant neoplasm of pancreas, unspecified: Secondary | ICD-10-CM

## 2011-03-23 LAB — CBC WITH DIFFERENTIAL/PLATELET
BASO%: 0.7 % (ref 0.0–2.0)
HCT: 39.1 % (ref 38.4–49.9)
HGB: 12.9 g/dL — ABNORMAL LOW (ref 13.0–17.1)
MCHC: 33 g/dL (ref 32.0–36.0)
MONO#: 0.5 10*3/uL (ref 0.1–0.9)
NEUT%: 42.9 % (ref 39.0–75.0)
WBC: 2.9 10*3/uL — ABNORMAL LOW (ref 4.0–10.3)
lymph#: 1.1 10*3/uL (ref 0.9–3.3)

## 2011-03-23 MED ORDER — DEXAMETHASONE 4 MG PO TABS: 4.0000 mg | ORAL_TABLET | Freq: Two times a day (BID) | ORAL | Status: AC

## 2011-03-23 NOTE — Progress Notes (Signed)
OFFICE PROGRESS NOTE  Interval history:   [name] is a 60 year old man with metastatic pancreas cancer. He began treatment on the FOLFOX regimen on 03/09/2011. Of note, he was originally scheduled to begin treatment on 03/02/2011. Treatment was held on that day due to thrombocytopenia (platelet count 39,000). The thrombocytopenia was felt to be a residual effect of previous gemcitabine chemotherapy.  [name] reports intermittent nausea/vomiting since the chemotherapy 2 weeks ago. He estimates that he vomits 2-3 times per day. The vomiting tends to occur soon after eating. He denies diarrhea. No mouth sores. He has avoided cold for the past 2 weeks. His appetite is poor. He reports weight loss. He has noted some improvement in the abdominal pain. He continues Percocet as needed.  Objective: Vital signs in last 24 hours: Blood pressure 116/73, pulse 75, temperature 98.5 F (36.9 C), temperature source Oral, height 6\' 1"  (1.854 m), weight 148 lb 6.4 oz (67.314 kg).  Oropharynx is without thrush or ulceration. Lungs are clear. No wheezes or rales. Regular cardiac rhythm. Port-A-Cath site is nontender and without erythema. Persistent superficial mass at the right mid abdomen measuring approximately 4 x 6 cm. There are skin changes at the site of the mass consistent with previous radiation therapy. Extremities are without edema. Calves are soft and nontender.   Lab Results:  Hemoglobin 12.9 white count 2.9 absolute neutrophil count 1.2 platelet count 195,000.      Studies/Results: No results found.  Medications: I have reviewed the patient's current medications.  Assessment/Plan: 1. 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 Histology and immunohistochemical profile consistent with a poorly differentiated tumor with neuroendocrine and hepatoid features. The CA 19-9 tumor  marker was normal at 15.7 on 12/03/2010 and the CEA was normal at 1.7. 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 Staging PET scan confirmed increased FDG activity associated with the right rectus sheath mass and multiple peritoneal metastases. Initiation of gemcitabine chemotherapy on the Onconova study 12/21/2010. He completed day 1 of cycle #2 on 01/18/2011. Restaging CT evaluation 01/31/2011 was consistent with disease progression. He began treatment on the FOLFOX regimen on 03/09/2011. 2. Pain secondary to the rectus sheath mass and pelvic/peritoneal metastases. He continues Percocet as needed for pain. He completed palliative radiation to the abdominal rectus sheath mass on 02/21/2011. He has noted recent mild improvement in abdominal pain. 3. 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 4. Remote history of heroin/cocaine use. 5. Ongoing tobacco use. 6. Port-A-Cath placement 01/06/2011. 7. History of neutropenia secondary to chemotherapy. He received Neulasta support after day 15 cycle #1 gemcitabine chemotherapy. 8. Delayed nausea secondary to chemotherapy-he will begin Decadron 4 mg twice daily for 2 days. He will continue Compazine as needed. We will adjust the pre-medication regimen to include Aloxi beginning with cycle #2 FOLFOX. 9. Severe thrombocytopenia on 03/02/2011-likely a residual effect of previous gemcitabine chemotherapy. The platelet count is in normal range today. 10. Disposition-[name] is experiencing delayed nausea following cycle #1 of FOLFOX chemotherapy. We will delay cycle 2 for one week. He will return for treatment on 03/30/2011. As noted above we will change the premedication regimen to include Aloxi. He will contact the office if he has nausea despite Aloxi. He will return for a followup visit and cycle 3 FOLFOX on 04/13/2011. He will contact the office in the interim as outlined above or  with any  other problems.  Plan reviewed with Dr. Truett Perna.      Lonna Cobb ANP/GNP-BC

## 2011-03-23 NOTE — Progress Notes (Signed)
Addended by: Rana Snare on: 03/23/2011 04:37 PM   Modules accepted: Orders

## 2011-03-25 ENCOUNTER — Other Ambulatory Visit: Payer: Self-pay | Admitting: *Deleted

## 2011-03-25 DIAGNOSIS — C259 Malignant neoplasm of pancreas, unspecified: Secondary | ICD-10-CM

## 2011-03-25 MED ORDER — OXYCODONE-ACETAMINOPHEN 10-325 MG PO TABS: 1.0000 | ORAL_TABLET | ORAL | Status: DC | PRN

## 2011-03-25 NOTE — Telephone Encounter (Signed)
Called patient back and made him aware that his script is ready for pick up. Mid level only approved #30 tabs at this time

## 2011-03-27 ENCOUNTER — Other Ambulatory Visit: Payer: Self-pay | Admitting: Oncology

## 2011-03-29 ENCOUNTER — Encounter: Payer: Self-pay | Admitting: Interventional Radiology

## 2011-03-30 ENCOUNTER — Other Ambulatory Visit: Payer: Medicaid Other | Admitting: Lab

## 2011-03-30 ENCOUNTER — Ambulatory Visit (HOSPITAL_BASED_OUTPATIENT_CLINIC_OR_DEPARTMENT_OTHER): Payer: Medicaid Other

## 2011-03-30 ENCOUNTER — Inpatient Hospital Stay: Payer: Medicaid Other

## 2011-03-30 ENCOUNTER — Ambulatory Visit: Payer: Medicaid Other | Admitting: Oncology

## 2011-03-30 VITALS — BP 115/65 | HR 72 | Temp 98.4°F

## 2011-03-30 DIAGNOSIS — C259 Malignant neoplasm of pancreas, unspecified: Secondary | ICD-10-CM

## 2011-03-30 DIAGNOSIS — C250 Malignant neoplasm of head of pancreas: Secondary | ICD-10-CM

## 2011-03-30 DIAGNOSIS — Z5111 Encounter for antineoplastic chemotherapy: Secondary | ICD-10-CM

## 2011-03-30 LAB — CBC WITH DIFFERENTIAL/PLATELET
EOS%: 1.6 % (ref 0.0–7.0)
Eosinophils Absolute: 0.1 10*3/uL (ref 0.0–0.5)
LYMPH%: 43.7 % (ref 14.0–49.0)
MCH: 28.2 pg (ref 27.2–33.4)
MCV: 86.2 fL (ref 79.3–98.0)
MONO%: 15.6 % — ABNORMAL HIGH (ref 0.0–14.0)
NEUT#: 1.7 10*3/uL (ref 1.5–6.5)
Platelets: 246 10*3/uL (ref 140–400)
RBC: 4.78 10*6/uL (ref 4.20–5.82)
RDW: 14.4 % (ref 11.0–14.6)

## 2011-03-30 LAB — COMPREHENSIVE METABOLIC PANEL
ALT: 28 U/L (ref 0–53)
AST: 42 U/L — ABNORMAL HIGH (ref 0–37)
Albumin: 3.1 g/dL — ABNORMAL LOW (ref 3.5–5.2)
Alkaline Phosphatase: 142 U/L — ABNORMAL HIGH (ref 39–117)
Glucose, Bld: 97 mg/dL (ref 70–99)
Potassium: 4.3 mEq/L (ref 3.5–5.3)
Sodium: 135 mEq/L (ref 135–145)
Total Bilirubin: 0.1 mg/dL — ABNORMAL LOW (ref 0.3–1.2)
Total Protein: 7.5 g/dL (ref 6.0–8.3)

## 2011-03-30 MED ORDER — OXYCODONE-ACETAMINOPHEN 5-325 MG PO TABS
2.0000 | ORAL_TABLET | Freq: Once | ORAL | Status: AC
  Administered 2011-03-30: 2 via ORAL

## 2011-03-30 MED ORDER — DEXAMETHASONE SODIUM PHOSPHATE 10 MG/ML IJ SOLN
10.0000 mg | Freq: Once | INTRAMUSCULAR | Status: AC
  Administered 2011-03-30: 10 mg via INTRAVENOUS

## 2011-03-30 MED ORDER — DEXTROSE 5 % IV SOLN
Freq: Once | INTRAVENOUS | Status: AC
  Administered 2011-03-30: 12:00:00 via INTRAVENOUS

## 2011-03-30 MED ORDER — FLUOROURACIL CHEMO INJECTION 2.5 GM/50ML
375.0000 mg/m2 | Freq: Once | INTRAVENOUS | Status: AC
  Administered 2011-03-30: 750 mg via INTRAVENOUS
  Filled 2011-03-30: qty 15

## 2011-03-30 MED ORDER — SODIUM CHLORIDE 0.9 % IV SOLN
2400.0000 mg/m2 | INTRAVENOUS | Status: DC
  Administered 2011-03-30: 4650 mg via INTRAVENOUS
  Filled 2011-03-30: qty 93

## 2011-03-30 MED ORDER — OXALIPLATIN CHEMO INJECTION 100 MG/20ML
68.0000 mg/m2 | Freq: Once | INTRAVENOUS | Status: AC
  Administered 2011-03-30: 130 mg via INTRAVENOUS
  Filled 2011-03-30: qty 26

## 2011-03-30 MED ORDER — PALONOSETRON HCL INJECTION 0.25 MG/5ML
0.2500 mg | Freq: Once | INTRAVENOUS | Status: AC
  Administered 2011-03-30: 0.25 mg via INTRAVENOUS

## 2011-03-30 MED ORDER — LEUCOVORIN CALCIUM INJECTION 350 MG
400.0000 mg/m2 | Freq: Once | INTRAVENOUS | Status: AC
  Administered 2011-03-30: 776 mg via INTRAVENOUS
  Filled 2011-03-30: qty 38.8

## 2011-04-01 ENCOUNTER — Other Ambulatory Visit: Payer: Self-pay | Admitting: *Deleted

## 2011-04-01 ENCOUNTER — Ambulatory Visit (HOSPITAL_BASED_OUTPATIENT_CLINIC_OR_DEPARTMENT_OTHER): Payer: Medicaid Other

## 2011-04-01 VITALS — BP 131/87 | HR 96 | Temp 99.8°F

## 2011-04-01 DIAGNOSIS — C259 Malignant neoplasm of pancreas, unspecified: Secondary | ICD-10-CM

## 2011-04-01 DIAGNOSIS — Z452 Encounter for adjustment and management of vascular access device: Secondary | ICD-10-CM

## 2011-04-01 DIAGNOSIS — C250 Malignant neoplasm of head of pancreas: Secondary | ICD-10-CM

## 2011-04-01 MED ORDER — HEPARIN SOD (PORK) LOCK FLUSH 100 UNIT/ML IV SOLN
500.0000 [IU] | Freq: Once | INTRAVENOUS | Status: AC | PRN
  Administered 2011-04-01: 500 [IU]
  Filled 2011-04-01: qty 5

## 2011-04-01 MED ORDER — SODIUM CHLORIDE 0.9 % IJ SOLN
10.0000 mL | INTRAMUSCULAR | Status: DC | PRN
  Administered 2011-04-01: 10 mL
  Filled 2011-04-01: qty 10

## 2011-04-01 MED ORDER — OXYCODONE-ACETAMINOPHEN 10-325 MG PO TABS: 1.0000 | ORAL_TABLET | ORAL | Status: DC | PRN

## 2011-04-01 NOTE — Telephone Encounter (Signed)
Pt called office requesting refill on Percocet tabs. Last filled on 03/25/11 for 30 tabs. Rx left at front desk for pick up.

## 2011-04-06 ENCOUNTER — Other Ambulatory Visit: Payer: Self-pay | Admitting: *Deleted

## 2011-04-06 DIAGNOSIS — C259 Malignant neoplasm of pancreas, unspecified: Secondary | ICD-10-CM

## 2011-04-06 MED ORDER — OXYCODONE-ACETAMINOPHEN 10-325 MG PO TABS: 1.0000 | ORAL_TABLET | ORAL | Status: DC | PRN

## 2011-04-06 NOTE — Telephone Encounter (Signed)
Patient requesting to pick up script for Percocet 10/325 and Compazine 10 mg. Called pharmacy and confirmed he still had valid refill on Compazine and they will prepare now.

## 2011-04-12 ENCOUNTER — Other Ambulatory Visit: Payer: Self-pay | Admitting: *Deleted

## 2011-04-12 ENCOUNTER — Ambulatory Visit: Payer: Medicaid Other | Admitting: Hematology and Oncology

## 2011-04-12 ENCOUNTER — Other Ambulatory Visit: Payer: Medicaid Other | Admitting: Lab

## 2011-04-12 DIAGNOSIS — C259 Malignant neoplasm of pancreas, unspecified: Secondary | ICD-10-CM

## 2011-04-13 ENCOUNTER — Other Ambulatory Visit: Payer: Self-pay | Admitting: *Deleted

## 2011-04-13 ENCOUNTER — Other Ambulatory Visit (HOSPITAL_BASED_OUTPATIENT_CLINIC_OR_DEPARTMENT_OTHER): Payer: Medicaid Other | Admitting: Lab

## 2011-04-13 ENCOUNTER — Telehealth: Payer: Self-pay | Admitting: Oncology

## 2011-04-13 ENCOUNTER — Ambulatory Visit (HOSPITAL_BASED_OUTPATIENT_CLINIC_OR_DEPARTMENT_OTHER): Payer: Medicaid Other | Admitting: Oncology

## 2011-04-13 ENCOUNTER — Inpatient Hospital Stay: Payer: Medicaid Other

## 2011-04-13 ENCOUNTER — Encounter: Payer: Self-pay | Admitting: Radiation Oncology

## 2011-04-13 VITALS — BP 114/75 | HR 74 | Temp 98.0°F | Ht 73.0 in | Wt 152.9 lb

## 2011-04-13 DIAGNOSIS — C250 Malignant neoplasm of head of pancreas: Secondary | ICD-10-CM

## 2011-04-13 DIAGNOSIS — C50919 Malignant neoplasm of unspecified site of unspecified female breast: Secondary | ICD-10-CM

## 2011-04-13 DIAGNOSIS — C259 Malignant neoplasm of pancreas, unspecified: Secondary | ICD-10-CM

## 2011-04-13 DIAGNOSIS — C786 Secondary malignant neoplasm of retroperitoneum and peritoneum: Secondary | ICD-10-CM

## 2011-04-13 DIAGNOSIS — G893 Neoplasm related pain (acute) (chronic): Secondary | ICD-10-CM

## 2011-04-13 LAB — CBC WITH DIFFERENTIAL/PLATELET
Basophils Absolute: 0 10*3/uL (ref 0.0–0.1)
EOS%: 3 % (ref 0.0–7.0)
HGB: 12.6 g/dL — ABNORMAL LOW (ref 13.0–17.1)
MCH: 28.1 pg (ref 27.2–33.4)
MCHC: 33.2 g/dL (ref 32.0–36.0)
MCV: 84.6 fL (ref 79.3–98.0)
MONO%: 17.7 % — ABNORMAL HIGH (ref 0.0–14.0)
NEUT%: 41.3 % (ref 39.0–75.0)
RDW: 14.3 % (ref 11.0–14.6)

## 2011-04-13 MED ORDER — OXYCODONE-ACETAMINOPHEN 10-325 MG PO TABS: 1.0000 | ORAL_TABLET | ORAL | Status: DC | PRN

## 2011-04-13 NOTE — Telephone Encounter (Signed)
S/w the pt and he is aware to stop by the scheduling desk to pick up his dec 2012 appt calendar

## 2011-04-13 NOTE — Progress Notes (Signed)
OFFICE PROGRESS NOTE   INTERVAL HISTORY:   [name] completed a second cycle of FOLFOX chemotherapy on November 14. He tolerated the chemotherapy well. He reports 4-5 days of cold sensitivity. This has resolved. He denies neuropathy symptoms today. He continues to have pain at the right abdominal mass and pelvis though this is slightly improved. He had a sore at the lower inner lip that has resolved. He does not have significant diarrhea.  Objective:  Vital signs in last 24 hours:  Blood pressure 114/75, pulse 74, temperature 98 F (36.7 C), temperature source Oral, height 6\' 1"  (1.854 m), weight 152 lb 14.4 oz (69.355 kg).    HEENT: No thrush or ulcers Resp: Lungs clear bilaterally Cardio: Regular rate and GI: There is a mass at the right mid abdominal wall. No hepatomegaly Vascular: No leg edema Skin: Radiation hyperpigmentation at the abdomen   Portacath/PICC-without erythema  Lab Results:  CBC  Lab Results  Component Value Date   WBC 2.7* 04/13/2011   HGB 12.6* 04/13/2011   HCT 37.9* 04/13/2011   MCV 84.6 04/13/2011   PLT 146 04/13/2011   ANC 1.1  Chemistry:      Component Value Date/Time   NA 135 03/30/2011 1117   K 4.3 03/30/2011 1117   CL 100 03/30/2011 1117   CO2 27 03/30/2011 1117   GLUCOSE 97 03/30/2011 1117   BUN 8 03/30/2011 1117   CREATININE 0.79 03/30/2011 1117   CALCIUM 9.0 03/30/2011 1117   PROT 7.5 03/30/2011 1117   ALBUMIN 3.1* 03/30/2011 1117   AST 42* 03/30/2011 1117   ALT 28 03/30/2011 1117   ALKPHOS 142* 03/30/2011 1117   BILITOT 0.1* 03/30/2011 1117   GFRNONAA >60 11/18/2010 1302   GFRAA >60 11/18/2010 1302       Medications: I have reviewed the patient's current medications.  Assessment/Plan: 1. 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  Histology and immunohistochemical profile were consistent with a  poorly-differentiated tumor with neuroendocrine and hepatoid features.  The CA19-9 tumor marker was normal at 15.7 on 12/03/2010, and the CEA was normal at 1.7.  a. 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 b. Staging PET scan confirmed increased FDG activity associated with the right rectus sheath mass and multiple peritoneal metastases.   c. Initiation of gemcitabine chemotherapy on the Onconova study 12/21/2010:  He completed day 1 of cycle #2 on 01/18/2011.   d. Restating CT evaluation 01/31/2011 was consistent with disease progression.   e. Initiation of FOLFOX chemotherapy with cycle #1 given on 03/09/2011. 2. Pain secondary to the rectus sheath mass and pelvic/peritoneal metastases:  He continues Percocet as needed for pain. a. He completed palliative radiation to the abdominal rectus sheath mass on 02/21/2011. 3. 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 4. Remote history of heroin/cocaine use. 5. Ongoing tobacco use. 6. Port-A-Cath placement 01/06/2011. 7. History of neutropenia secondary to chemotherapy:  He received Neulasta support after day 15/cycle #1 gemcitabine chemotherapy. He has neutropenia today. The plan is to add Neulasta with the next cycle of chemotherapy.   Disposition:  [name] appears stable. We decided to hold cycle #3 of FOLFOX due to neutropenia. He'll return for cycle #3 on December 5. He will receive Neulasta support with this cycle. He will return for an office visit and cycle #4 of FOLFOX on December 19.   [name] Mcglamery BRADLEY,  MD  04/13/2011  1:55 PM

## 2011-04-14 ENCOUNTER — Telehealth: Payer: Self-pay | Admitting: Oncology

## 2011-04-14 NOTE — Telephone Encounter (Signed)
Made a note °

## 2011-04-15 ENCOUNTER — Encounter: Payer: Self-pay | Admitting: Radiation Oncology

## 2011-04-15 ENCOUNTER — Ambulatory Visit
Admission: RE | Admit: 2011-04-15 | Discharge: 2011-04-15 | Disposition: A | Discharge status: Home / Self Care | Payer: Medicaid Other | Source: Ambulatory Visit | Attending: Radiation Oncology | Admitting: Radiation Oncology

## 2011-04-15 ENCOUNTER — Telehealth: Payer: Self-pay | Admitting: *Deleted

## 2011-04-15 ENCOUNTER — Telehealth: Payer: Self-pay | Admitting: Oncology

## 2011-04-15 DIAGNOSIS — C259 Malignant neoplasm of pancreas, unspecified: Secondary | ICD-10-CM

## 2011-04-15 NOTE — Telephone Encounter (Signed)
04/15/11 15:19 Onconova 04-22 study follow up call.  Rn called the pt to check on his status.  He stated that he has been doing well.  He said that his treatment was held this week because his counts were low.  He said that he will have scans soon to check on his response to this new treatment.  He said that Dr. Mitzi Hansen thinks the radiation may have shrunk his abdominal mass.  Rn asked the pt about his Reglan usage.  He confirmed that he started taking the medication in the fall of 2011.  He said that he did take the medication during the study for nausea relief.  He said currently his prescription" ran ou"t, and he has no re-fills.  He states that his last Reglan was last month (October) date unknown.  Rn thanked the pt for his help.

## 2011-04-15 NOTE — Telephone Encounter (Signed)
gve the pt his dec 2012 appt calendar °

## 2011-04-15 NOTE — Progress Notes (Signed)
Patient presents to the clinic today unaccompanied for follow up appointment with Dr. Mitzi Hansen. No distress noted. Steady gait noted. Patient reports constant burning abdominal pain 4 on a scale of 0-10 that worse at night. Patient reports taking Percocet 10325 mg for this pain but, states it does relieve the pain causing him to having to take more. Patient requesting refill on Percocet. Patient reports he is only able to sleep 2-3 hours per night before he wakes up in pain. Patient reports persistent nausea for which he takes compazine. Patient reports intermittent episodes of diarrhea then, constipation. Patient unable to maintain regular bowel habits.

## 2011-04-15 NOTE — Progress Notes (Signed)
CC:   [name] Lint, MD Ladene Artist, M.D.  DIAGNOSIS:  Metastatic pancreatic cancer.  INTERVAL HISTORY:  [name] returns to clinic today for followup. He completed his course of palliative radiation on 02/21/2011.  He does complain of some ongoing pain which waxes and wanes to some degree.  He is taking Percocet for this and he indicates that he does need a refill for this.  He does on occasion take it more often, especially at night when he wakes up after a couple of hours.  The patient notes some nausea, but states that the Compazine is working okay for this.  He does indicate that he has been alternating between some diarrhea and constipation as well.  PHYSICAL EXAMINATION:  Weight 155 pounds, blood pressure 114/76, pulse 88.  General:  Well-developed male in no acute distress. Cardiovascular:  Regular rate and rhythm.  Respiratory:  Clear to auscultation.  GI:  A palpable right upper abdominal mass remains, although this has decreased in size and is somewhat softer.  This is moderately to minimally tender to touch.  Extremities:  No edema.  IMPRESSION/PLAN:  [name] appears to be doing satisfactorily at this time with no new issues I believe regarding his radiation treatment, and no significant new issues otherwise.  He is proceeding with chemotherapy at this point through Dr. Truett Perna and does have some scans pending.  I would like to have the patient return to our clinic in 4 months for ongoing followup.    ______________________________ Radene Gunning, M.D., Ph.D. JSM/MEDQ  D:  04/15/2011  T:  04/15/2011  Job:  [id number]

## 2011-04-20 ENCOUNTER — Other Ambulatory Visit: Payer: Self-pay | Admitting: *Deleted

## 2011-04-20 ENCOUNTER — Other Ambulatory Visit: Payer: Self-pay | Admitting: Oncology

## 2011-04-20 ENCOUNTER — Ambulatory Visit (HOSPITAL_BASED_OUTPATIENT_CLINIC_OR_DEPARTMENT_OTHER): Payer: Medicaid Other

## 2011-04-20 ENCOUNTER — Other Ambulatory Visit: Payer: Medicaid Other | Admitting: Lab

## 2011-04-20 VITALS — BP 124/78 | HR 78 | Temp 97.4°F

## 2011-04-20 DIAGNOSIS — C50919 Malignant neoplasm of unspecified site of unspecified female breast: Secondary | ICD-10-CM

## 2011-04-20 DIAGNOSIS — Z5111 Encounter for antineoplastic chemotherapy: Secondary | ICD-10-CM

## 2011-04-20 DIAGNOSIS — C259 Malignant neoplasm of pancreas, unspecified: Secondary | ICD-10-CM

## 2011-04-20 DIAGNOSIS — C250 Malignant neoplasm of head of pancreas: Secondary | ICD-10-CM

## 2011-04-20 DIAGNOSIS — R52 Pain, unspecified: Secondary | ICD-10-CM

## 2011-04-20 LAB — CBC WITH DIFFERENTIAL/PLATELET
BASO%: 0.9 % (ref 0.0–2.0)
EOS%: 4.3 % (ref 0.0–7.0)
HCT: 39.6 % (ref 38.4–49.9)
LYMPH%: 34.3 % (ref 14.0–49.0)
MCH: 28.2 pg (ref 27.2–33.4)
MCHC: 33.1 g/dL (ref 32.0–36.0)
NEUT%: 37.1 % — ABNORMAL LOW (ref 39.0–75.0)
RBC: 4.65 10*6/uL (ref 4.20–5.82)
lymph#: 1.2 10*3/uL (ref 0.9–3.3)
nRBC: 0 % (ref 0–0)

## 2011-04-20 LAB — COMPREHENSIVE METABOLIC PANEL
AST: 48 U/L — ABNORMAL HIGH (ref 0–37)
Alkaline Phosphatase: 143 U/L — ABNORMAL HIGH (ref 39–117)
BUN: 6 mg/dL (ref 6–23)
Creatinine, Ser: 0.79 mg/dL (ref 0.50–1.35)
Potassium: 3.9 mEq/L (ref 3.5–5.3)

## 2011-04-20 MED ORDER — OXYCODONE-ACETAMINOPHEN 5-325 MG PO TABS
1.0000 | ORAL_TABLET | Freq: Once | ORAL | Status: AC
  Administered 2011-04-20: 1 via ORAL

## 2011-04-20 MED ORDER — OXYCODONE-ACETAMINOPHEN 10-325 MG PO TABS: 1.0000 | ORAL_TABLET | ORAL | Status: DC | PRN

## 2011-04-20 MED ORDER — DEXTROSE 5 % IV SOLN
Freq: Once | INTRAVENOUS | Status: AC
  Administered 2011-04-20: 09:00:00 via INTRAVENOUS

## 2011-04-20 MED ORDER — SODIUM CHLORIDE 0.9 % IV SOLN
2400.0000 mg/m2 | INTRAVENOUS | Status: DC
  Administered 2011-04-20: 4650 mg via INTRAVENOUS
  Filled 2011-04-20: qty 93

## 2011-04-20 MED ORDER — OXYCODONE-ACETAMINOPHEN 5-325 MG PO TABS: 1.0000 | ORAL_TABLET | Freq: Once | ORAL | Status: DC

## 2011-04-20 MED ORDER — LEUCOVORIN CALCIUM INJECTION 350 MG
400.0000 mg/m2 | Freq: Once | INTRAVENOUS | Status: AC
  Administered 2011-04-20: 776 mg via INTRAVENOUS
  Filled 2011-04-20: qty 38.8

## 2011-04-20 MED ORDER — DEXAMETHASONE SODIUM PHOSPHATE 10 MG/ML IJ SOLN
10.0000 mg | Freq: Once | INTRAMUSCULAR | Status: AC
  Administered 2011-04-20: 10 mg via INTRAVENOUS

## 2011-04-20 MED ORDER — PALONOSETRON HCL INJECTION 0.25 MG/5ML
0.2500 mg | Freq: Once | INTRAVENOUS | Status: AC
  Administered 2011-04-20: 0.25 mg via INTRAVENOUS

## 2011-04-20 MED ORDER — FLUOROURACIL CHEMO INJECTION 2.5 GM/50ML
375.0000 mg/m2 | Freq: Once | INTRAVENOUS | Status: AC
  Administered 2011-04-20: 750 mg via INTRAVENOUS
  Filled 2011-04-20: qty 15

## 2011-04-20 MED ORDER — OXALIPLATIN CHEMO INJECTION 100 MG/20ML
68.0000 mg/m2 | Freq: Once | INTRAVENOUS | Status: AC
  Administered 2011-04-20: 130 mg via INTRAVENOUS
  Filled 2011-04-20: qty 26

## 2011-04-20 NOTE — Telephone Encounter (Signed)
Call from Salley at [facility] that patient presented his script for Percocet today but it was not filled. Script was returned to patient. His insurance carrier said it is too early to fill and was filled recently by another providing pharmacy. Data base has not yet entered pharmacy that refilled the med.

## 2011-04-20 NOTE — Progress Notes (Signed)
Patient complain of pain upon arrival to infusion room, stated he only took nausea medicine and not pain medicine, rates pain 5 or 6 of 10 and requesting Dr. Truett Perna give him something for pain.  MD notified and order received.  Patient sleeping in chair, awakens when name called.  Pain medication given as ordered.

## 2011-04-20 NOTE — Patient Instructions (Signed)
Patient ambulatory out of clinic.  No complaints of pain.  Patient aware of next appointment and to call with any problems.

## 2011-04-22 ENCOUNTER — Ambulatory Visit (HOSPITAL_BASED_OUTPATIENT_CLINIC_OR_DEPARTMENT_OTHER): Payer: Medicaid Other

## 2011-04-22 VITALS — BP 137/83 | HR 83 | Temp 98.4°F

## 2011-04-22 DIAGNOSIS — C259 Malignant neoplasm of pancreas, unspecified: Secondary | ICD-10-CM

## 2011-04-22 DIAGNOSIS — C250 Malignant neoplasm of head of pancreas: Secondary | ICD-10-CM

## 2011-04-22 MED ORDER — HEPARIN SOD (PORK) LOCK FLUSH 100 UNIT/ML IV SOLN
500.0000 [IU] | Freq: Once | INTRAVENOUS | Status: AC | PRN
  Administered 2011-04-22: 500 [IU]
  Filled 2011-04-22: qty 5

## 2011-04-22 MED ORDER — PEGFILGRASTIM INJECTION 6 MG/0.6ML
6.0000 mg | Freq: Once | SUBCUTANEOUS | Status: AC
  Administered 2011-04-22: 6 mg via SUBCUTANEOUS
  Filled 2011-04-22: qty 0.6

## 2011-04-22 MED ORDER — SODIUM CHLORIDE 0.9 % IJ SOLN
10.0000 mL | INTRAMUSCULAR | Status: DC | PRN
  Administered 2011-04-22: 10 mL
  Filled 2011-04-22: qty 10

## 2011-04-27 ENCOUNTER — Telehealth: Payer: Self-pay | Admitting: Oncology

## 2011-04-27 NOTE — Telephone Encounter (Signed)
Patient received one prescription from [facility] on 04/25/11 $3.00,his remaning balance in [facility] $21.30.

## 2011-04-29 ENCOUNTER — Inpatient Hospital Stay: Payer: Medicaid Other

## 2011-05-02 ENCOUNTER — Other Ambulatory Visit: Payer: Self-pay | Admitting: *Deleted

## 2011-05-02 DIAGNOSIS — C259 Malignant neoplasm of pancreas, unspecified: Secondary | ICD-10-CM

## 2011-05-02 MED ORDER — OXYCODONE-ACETAMINOPHEN 10-325 MG PO TABS: 1.0000 | ORAL_TABLET | ORAL | Status: DC | PRN

## 2011-05-02 NOTE — Telephone Encounter (Signed)
Patient has called requesting refill on his Percocet 10/325--will pick up.

## 2011-05-04 ENCOUNTER — Inpatient Hospital Stay: Payer: Medicaid Other

## 2011-05-04 ENCOUNTER — Encounter: Payer: Self-pay | Admitting: *Deleted

## 2011-05-04 ENCOUNTER — Ambulatory Visit (HOSPITAL_BASED_OUTPATIENT_CLINIC_OR_DEPARTMENT_OTHER): Payer: Medicaid Other | Admitting: Nurse Practitioner

## 2011-05-04 ENCOUNTER — Other Ambulatory Visit (HOSPITAL_BASED_OUTPATIENT_CLINIC_OR_DEPARTMENT_OTHER): Payer: Medicaid Other | Admitting: Lab

## 2011-05-04 DIAGNOSIS — Z79899 Other long term (current) drug therapy: Secondary | ICD-10-CM

## 2011-05-04 DIAGNOSIS — C250 Malignant neoplasm of head of pancreas: Secondary | ICD-10-CM

## 2011-05-04 DIAGNOSIS — M79609 Pain in unspecified limb: Secondary | ICD-10-CM

## 2011-05-04 DIAGNOSIS — C259 Malignant neoplasm of pancreas, unspecified: Secondary | ICD-10-CM

## 2011-05-04 DIAGNOSIS — C786 Secondary malignant neoplasm of retroperitoneum and peritoneum: Secondary | ICD-10-CM

## 2011-05-04 DIAGNOSIS — Z923 Personal history of irradiation: Secondary | ICD-10-CM

## 2011-05-04 DIAGNOSIS — C779 Secondary and unspecified malignant neoplasm of lymph node, unspecified: Secondary | ICD-10-CM

## 2011-05-04 DIAGNOSIS — M545 Low back pain: Secondary | ICD-10-CM

## 2011-05-04 LAB — CBC WITH DIFFERENTIAL/PLATELET
Basophils Absolute: 0.1 10*3/uL (ref 0.0–0.1)
Eosinophils Absolute: 0.1 10*3/uL (ref 0.0–0.5)
HCT: 39.6 % (ref 38.4–49.9)
HGB: 13 g/dL (ref 13.0–17.1)
LYMPH%: 12.1 % — ABNORMAL LOW (ref 14.0–49.0)
MCV: 85.2 fL (ref 79.3–98.0)
MONO%: 7.9 % (ref 0.0–14.0)
NEUT#: 11.4 10*3/uL — ABNORMAL HIGH (ref 1.5–6.5)
NEUT%: 78.7 % — ABNORMAL HIGH (ref 39.0–75.0)
Platelets: 134 10*3/uL — ABNORMAL LOW (ref 140–400)
RBC: 4.65 10*6/uL (ref 4.20–5.82)

## 2011-05-04 NOTE — Progress Notes (Signed)
OFFICE PROGRESS NOTE  Interval history:  [name] is a 60 year old man with metastatic pancreas cancer. He completed cycle 3 FOLFOX on 04/20/2011 with Neulasta support. He is seen today prior to proceeding with cycle 4.  [name] reports mild nausea/vomiting for approximately 3 days following the most recent cycle of chemotherapy. He denies mouth sores. No diarrhea. He denies numbness or tingling in his hands or feet. Abdominal pain is better.  Over the past 1-1/2-2 weeks he has been experiencing pain in the lower back radiating to the right leg. The right leg intermittently feels numb and weak. The leg intermittently "gives way". His current pain medication does not relieve the pain. He reports a right hip replacement in the past. He is concerned that the pain is related to the hip replacement. He denies bowel or bladder dysfunction.   Objective: Blood pressure 117/77, pulse 86, temperature 98 F (36.7 C), temperature source Oral, height 6\' 1"  (1.854 m), weight 153 lb 1.6 oz (69.446 kg).  Oropharynx is without thrush or ulceration. Lungs are clear. No wheezes or rales. Regular cardiac rhythm. Port-A-Cath site is without erythema. Abdomen is soft. The mass at the right mid abdominal wall appears softer. No hepatomegaly. Extremities are without edema. Slight weakness with dorsiflexion of the right foot. Motor strength is otherwise intact.   Lab Results: Lab Results  Component Value Date   WBC 14.5* 05/04/2011   HGB 13.0 05/04/2011   HCT 39.6 05/04/2011   MCV 85.2 05/04/2011   PLT 134* 05/04/2011       Studies/Results: No results found.  Medications: I have reviewed the patient's current medications.  Assessment/Plan:  1. 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 Histology and immunohistochemical profile were consistent with a poorly-differentiated tumor with  neuroendocrine and hepatoid features. The CA19-9 tumor marker was normal at 15.7 on 12/03/2010, and the CEA was normal at 1.7.  a. 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 b. Staging PET scan confirmed increased FDG activity associated with the right rectus sheath mass and multiple peritoneal metastases.  c. Initiation of gemcitabine chemotherapy on the [facility] study 12/21/2010: He completed day 1 of cycle #2 on 01/18/2011.  d. Restating CT evaluation 01/31/2011 was consistent with disease progression.  e. Initiation of FOLFOX chemotherapy with cycle #1 given on 03/09/2011. He completed cycle 2 on 03/30/2011. He completed cycle 3 on 04/20/2011 with Neulasta support. 2. Pain secondary to the rectus sheath mass and pelvic/peritoneal metastases: Improved. He continues Percocet as needed for pain. a. He completed palliative radiation to the abdominal rectus sheath mass on 02/21/2011. 3. 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 4. Remote history of heroin/cocaine use. 5. Ongoing tobacco use. 6. Port-A-Cath placement 01/06/2011. 7. History of neutropenia secondary to chemotherapy: He received Neulasta support after day 15/cycle #1 gemcitabine chemotherapy. Neulasta was added beginning with cycle 3 FOLFOX. 8. 1-1/2-2 week history of low back pain radiating to the right leg. He will begin Decadron 8 mg twice daily. We are referring him for an MRI of the lumbosacral spine. He was instructed to contact the office with worsening pain or onset of new symptoms.  Disposition-the etiology of [name] back and leg pain is unclear. We are referring him for an MRI as above. We will hold cycle 4 FOLFOX and reschedule for one week. We are scheduling a followup visit prior to treatment in  one week. We will adjust his appointments accordingly pending the MRI result. [name] will contact the office prior to his next visit as  outlined above or with any other problems.  Patient seen with Dr. Truett [name].    Lonna Cobb ANP/GNP-BC

## 2011-05-04 NOTE — Progress Notes (Signed)
05/04/11 at 10:55am- The pt is into the cancer center today for continued follow-up and to receive his off-study treatment.  The pt states he is not doing well.  He states that he is having a hard time walking.  The pt did have a hip replacement in March 2012.  He said that he is unsure if it is related to his previous surgery or from his current cancer.  The pt said that he was going to ask the NP for an X-ray. The pt had labs obtained today.  The pt will be seen and examined today by Lonna Cobb, NP.  The pt is scheduled to receive his off-study chemotherapy, FOLFOX today.  The pt has not had any scans for disease assessment since his end of study scans (which confirmed progression).  Rn will continue to follow the pt and record his anti-therapy information and survival status for study purposes.

## 2011-05-05 ENCOUNTER — Ambulatory Visit (HOSPITAL_COMMUNITY)
Admission: RE | Admit: 2011-05-05 | Discharge: 2011-05-05 | Disposition: A | Discharge status: Home / Self Care | Payer: Medicaid Other | Source: Ambulatory Visit | Attending: Nurse Practitioner | Admitting: Nurse Practitioner

## 2011-05-05 ENCOUNTER — Inpatient Hospital Stay (HOSPITAL_COMMUNITY): Admission: RE | Admit: 2011-05-05 | Payer: Medicaid Other | Source: Ambulatory Visit

## 2011-05-05 DIAGNOSIS — M545 Low back pain, unspecified: Secondary | ICD-10-CM | POA: Insufficient documentation

## 2011-05-05 DIAGNOSIS — M549 Dorsalgia, unspecified: Secondary | ICD-10-CM | POA: Insufficient documentation

## 2011-05-05 DIAGNOSIS — M79609 Pain in unspecified limb: Secondary | ICD-10-CM | POA: Insufficient documentation

## 2011-05-05 DIAGNOSIS — C259 Malignant neoplasm of pancreas, unspecified: Secondary | ICD-10-CM | POA: Insufficient documentation

## 2011-05-05 DIAGNOSIS — M5126 Other intervertebral disc displacement, lumbar region: Secondary | ICD-10-CM | POA: Insufficient documentation

## 2011-05-05 DIAGNOSIS — M51379 Other intervertebral disc degeneration, lumbosacral region without mention of lumbar back pain or lower extremity pain: Secondary | ICD-10-CM | POA: Insufficient documentation

## 2011-05-05 DIAGNOSIS — M5137 Other intervertebral disc degeneration, lumbosacral region: Secondary | ICD-10-CM | POA: Insufficient documentation

## 2011-05-05 MED ORDER — GADOBENATE DIMEGLUMINE 529 MG/ML IV SOLN: 14.0000 mL | Freq: Once | INTRAVENOUS | Status: AC | PRN

## 2011-05-06 ENCOUNTER — Telehealth: Payer: Self-pay | Admitting: *Deleted

## 2011-05-06 NOTE — Telephone Encounter (Signed)
05/06/11 at 1:39 -The pt called and asked the nurse to call him back.  Rn was aware that the pt had an MRI of his spine last night.  Rn took the results to Dr. Truett Perna to review.  He said to inform the pt that there was "no discernible osseous or intraspinal metastatic disease".  He said the pt has some disc issues and that his steroids needs to be addressed.  Dr. Truett Perna said that we may need to refer the pt back to his orthopedic surgeon if the pain persists.  Rn called the pt back and informed him about his scan.  The nurse said someone would call him later about his steroid adjustment.  The pt said that he never got his steroid prescription filled.  Rn explained that this was to help alleviate his back pain.  Rn notified Lonna Cobb, NP that the pt stated he never filled his steroid prescription.  Misty Stanley said she would contact him later by phone about his decadron doses.  The nurse also informed Misty Stanley that he is asking for more Percocet pain pills.

## 2011-05-09 ENCOUNTER — Other Ambulatory Visit: Payer: Self-pay | Admitting: *Deleted

## 2011-05-09 DIAGNOSIS — C259 Malignant neoplasm of pancreas, unspecified: Secondary | ICD-10-CM

## 2011-05-09 MED ORDER — OXYCODONE-ACETAMINOPHEN 10-325 MG PO TABS: 1.0000 | ORAL_TABLET | ORAL | Status: DC | PRN

## 2011-05-11 ENCOUNTER — Encounter (HOSPITAL_COMMUNITY): Payer: Self-pay | Admitting: *Deleted

## 2011-05-11 ENCOUNTER — Emergency Department (HOSPITAL_COMMUNITY): Payer: Medicaid Other

## 2011-05-11 ENCOUNTER — Inpatient Hospital Stay (HOSPITAL_COMMUNITY)
Admission: EM | Admit: 2011-05-11 | Discharge: 2011-05-20 | DRG: 436 | Disposition: A | Discharge status: Home / Self Care | Payer: Medicaid Other | Attending: Oncology | Admitting: Oncology

## 2011-05-11 ENCOUNTER — Telehealth: Payer: Self-pay | Admitting: Oncology

## 2011-05-11 ENCOUNTER — Telehealth: Payer: Self-pay | Admitting: *Deleted

## 2011-05-11 DIAGNOSIS — Z96649 Presence of unspecified artificial hip joint: Secondary | ICD-10-CM

## 2011-05-11 DIAGNOSIS — C799 Secondary malignant neoplasm of unspecified site: Secondary | ICD-10-CM

## 2011-05-11 DIAGNOSIS — T398X5A Adverse effect of other nonopioid analgesics and antipyretics, not elsewhere classified, initial encounter: Secondary | ICD-10-CM | POA: Diagnosis present

## 2011-05-11 DIAGNOSIS — C786 Secondary malignant neoplasm of retroperitoneum and peritoneum: Secondary | ICD-10-CM | POA: Diagnosis present

## 2011-05-11 DIAGNOSIS — R29898 Other symptoms and signs involving the musculoskeletal system: Secondary | ICD-10-CM | POA: Diagnosis present

## 2011-05-11 DIAGNOSIS — F17200 Nicotine dependence, unspecified, uncomplicated: Secondary | ICD-10-CM | POA: Diagnosis present

## 2011-05-11 DIAGNOSIS — Z923 Personal history of irradiation: Secondary | ICD-10-CM

## 2011-05-11 DIAGNOSIS — M545 Low back pain, unspecified: Secondary | ICD-10-CM

## 2011-05-11 DIAGNOSIS — G893 Neoplasm related pain (acute) (chronic): Secondary | ICD-10-CM | POA: Diagnosis present

## 2011-05-11 DIAGNOSIS — M5126 Other intervertebral disc displacement, lumbar region: Secondary | ICD-10-CM | POA: Diagnosis present

## 2011-05-11 DIAGNOSIS — K5900 Constipation, unspecified: Secondary | ICD-10-CM | POA: Diagnosis present

## 2011-05-11 DIAGNOSIS — Z9221 Personal history of antineoplastic chemotherapy: Secondary | ICD-10-CM

## 2011-05-11 DIAGNOSIS — C250 Malignant neoplasm of head of pancreas: Principal | ICD-10-CM | POA: Diagnosis present

## 2011-05-11 DIAGNOSIS — I10 Essential (primary) hypertension: Secondary | ICD-10-CM

## 2011-05-11 DIAGNOSIS — R112 Nausea with vomiting, unspecified: Secondary | ICD-10-CM | POA: Diagnosis present

## 2011-05-11 DIAGNOSIS — C259 Malignant neoplasm of pancreas, unspecified: Secondary | ICD-10-CM

## 2011-05-11 DIAGNOSIS — M5106 Intervertebral disc disorders with myelopathy, lumbar region: Secondary | ICD-10-CM

## 2011-05-11 DIAGNOSIS — C50919 Malignant neoplasm of unspecified site of unspecified female breast: Secondary | ICD-10-CM | POA: Diagnosis present

## 2011-05-11 HISTORY — DX: Intervertebral disc disorders with myelopathy, lumbar region: M51.06

## 2011-05-11 HISTORY — DX: Acute pancreatitis without necrosis or infection, unspecified: K85.90

## 2011-05-11 HISTORY — DX: Essential (primary) hypertension: I10

## 2011-05-11 HISTORY — DX: Unspecified osteoarthritis, unspecified site: M19.90

## 2011-05-11 LAB — CBC
HCT: 37.8 % — ABNORMAL LOW (ref 39.0–52.0)
Hemoglobin: 12.6 g/dL — ABNORMAL LOW (ref 13.0–17.0)
MCH: 28.1 pg (ref 26.0–34.0)
MCHC: 33.3 g/dL (ref 30.0–36.0)
MCV: 84.2 fL (ref 78.0–100.0)
RDW: 15.2 % (ref 11.5–15.5)

## 2011-05-11 LAB — DIFFERENTIAL
Basophils Relative: 0 % (ref 0–1)
Eosinophils Absolute: 0 10*3/uL (ref 0.0–0.7)
Eosinophils Relative: 0 % (ref 0–5)
Monocytes Absolute: 2.3 10*3/uL — ABNORMAL HIGH (ref 0.1–1.0)
Monocytes Relative: 12 % (ref 3–12)
Neutro Abs: 15.2 10*3/uL — ABNORMAL HIGH (ref 1.7–7.7)

## 2011-05-11 LAB — COMPREHENSIVE METABOLIC PANEL
Albumin: 2.9 g/dL — ABNORMAL LOW (ref 3.5–5.2)
BUN: 9 mg/dL (ref 6–23)
Calcium: 9.2 mg/dL (ref 8.4–10.5)
Chloride: 96 mEq/L (ref 96–112)
Creatinine, Ser: 0.55 mg/dL (ref 0.50–1.35)
Total Bilirubin: 0.3 mg/dL (ref 0.3–1.2)

## 2011-05-11 LAB — LIPASE, BLOOD: Lipase: 170 U/L — ABNORMAL HIGH (ref 11–59)

## 2011-05-11 MED ORDER — ZOLPIDEM TARTRATE 10 MG PO TABS
10.0000 mg | ORAL_TABLET | Freq: Every evening | ORAL | Status: DC | PRN
  Administered 2011-05-18 – 2011-05-19 (×2): 10 mg via ORAL
  Filled 2011-05-11 (×2): qty 1

## 2011-05-11 MED ORDER — KETOROLAC TROMETHAMINE 30 MG/ML IJ SOLN
30.0000 mg | Freq: Once | INTRAMUSCULAR | Status: AC
  Administered 2011-05-11: 30 mg via INTRAVENOUS
  Filled 2011-05-11: qty 1

## 2011-05-11 MED ORDER — FENTANYL 10 MCG/ML IV SOLN
INTRAVENOUS | Status: DC
  Filled 2011-05-11: qty 50

## 2011-05-11 MED ORDER — DIPHENHYDRAMINE HCL 50 MG/ML IJ SOLN: 12.5000 mg | Freq: Four times a day (QID) | INTRAMUSCULAR | Status: DC | PRN

## 2011-05-11 MED ORDER — MORPHINE SULFATE 4 MG/ML IJ SOLN
6.0000 mg | Freq: Once | INTRAMUSCULAR | Status: AC
  Administered 2011-05-11: 6 mg via INTRAVENOUS

## 2011-05-11 MED ORDER — MORPHINE SULFATE 4 MG/ML IJ SOLN
6.0000 mg | Freq: Once | INTRAMUSCULAR | Status: AC
  Administered 2011-05-11: 6 mg via INTRAVENOUS
  Filled 2011-05-11 (×2): qty 2

## 2011-05-11 MED ORDER — DIPHENHYDRAMINE HCL 12.5 MG/5ML PO ELIX: 12.5000 mg | ORAL_SOLUTION | Freq: Four times a day (QID) | ORAL | Status: DC | PRN

## 2011-05-11 MED ORDER — SODIUM CHLORIDE 0.9 % IJ SOLN: 9.0000 mL | INTRAMUSCULAR | Status: DC | PRN

## 2011-05-11 MED ORDER — NALOXONE HCL 0.4 MG/ML IJ SOLN: 0.4000 mg | INTRAMUSCULAR | Status: DC | PRN

## 2011-05-11 MED ORDER — PANTOPRAZOLE SODIUM 40 MG IV SOLR
40.0000 mg | INTRAVENOUS | Status: DC
  Filled 2011-05-11 (×2): qty 40

## 2011-05-11 MED ORDER — ONDANSETRON HCL 4 MG/2ML IJ SOLN: 4.0000 mg | Freq: Four times a day (QID) | INTRAMUSCULAR | Status: DC | PRN

## 2011-05-11 MED ORDER — KETOROLAC TROMETHAMINE 30 MG/ML IJ SOLN
30.0000 mg | Freq: Four times a day (QID) | INTRAMUSCULAR | Status: AC | PRN
  Administered 2011-05-12 – 2011-05-16 (×15): 30 mg via INTRAVENOUS
  Filled 2011-05-11 (×15): qty 1

## 2011-05-11 MED ORDER — MORPHINE SULFATE 4 MG/ML IJ SOLN
6.0000 mg | Freq: Once | INTRAMUSCULAR | Status: AC
  Administered 2011-05-11: 6 mg via INTRAVENOUS
  Filled 2011-05-11: qty 2

## 2011-05-11 MED ORDER — OXYCODONE-ACETAMINOPHEN 5-325 MG PO TABS
2.0000 | ORAL_TABLET | Freq: Four times a day (QID) | ORAL | Status: DC | PRN
  Administered 2011-05-11 – 2011-05-12 (×2): 2 via ORAL
  Filled 2011-05-11 (×2): qty 2

## 2011-05-11 MED ORDER — ONDANSETRON HCL 4 MG/2ML IJ SOLN
4.0000 mg | Freq: Once | INTRAMUSCULAR | Status: AC
  Administered 2011-05-11: 4 mg via INTRAVENOUS
  Filled 2011-05-11: qty 2

## 2011-05-11 MED ORDER — IOHEXOL 300 MG/ML  SOLN
100.0000 mL | Freq: Once | INTRAMUSCULAR | Status: AC | PRN
  Administered 2011-05-11: 100 mL via INTRAVENOUS

## 2011-05-11 MED ORDER — BISACODYL 5 MG PO TBEC
5.0000 mg | DELAYED_RELEASE_TABLET | Freq: Once | ORAL | Status: AC
  Administered 2011-05-11: 5 mg via ORAL
  Filled 2011-05-11: qty 1

## 2011-05-11 NOTE — H&P (Signed)
PCP:   No primary provider on file.  Primary oncologist: Dr. Myrle Sheng  Chief Complaint:  Abdominal pain  HPI: This is a 60 year old African American male with a history of pancreatic cancer status post-Whipple procedure with recurrent metastatic disease currently on chemotherapy who presents to the emergency room in with severe nonradiating 10 out of 10 periumbilical pain that started 2-3 days ago. The pain has been getting worse. Nothing improves the pain, though he has tried MiraLAX and stool softeners for constipation. He reported a fever of 101 this morning, the patient remained afebrile while in the emergency department. His last stool was 2 days ago.   Review of Systems:  The patient denies anorexia, weight loss,, vision loss, decreased hearing, hoarseness, chest pain, syncope, dyspnea on exertion, peripheral edema, balance deficits, hemoptysis, abdominal pain, melena, hematochezia, severe indigestion/heartburn, hematuria, incontinence, genital sores, muscle weakness, suspicious skin lesions, transient blindness, difficulty walking, depression, unusual weight change, abnormal bleeding, enlarged lymph nodes, angioedema, and breast masses.  Past Medical History: Past Medical History  Diagnosis Date  . Diarrhea   . Nausea   . Rash     on abdomen from radiation   . Chronic back pain     r/t disc disease--w/bilateral leg pain  . History of drug abuse     Remote--heroin & cocaine  . Pancreatic cancer 03/2010    poorly differentiated adenocarcinoma  . 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 Hypertension    Past Surgical History  Procedure Date  . Whipple procedure 02/08/2010  . Portacath placement 01/06/11    8/15/12tip in superior cavoatrial junction    Medications: Prior to Admission medications   Medication Sig Start Date End Date Taking? Authorizing Provider  lidocaine-prilocaine (EMLA) cream Apply topically as needed.     Yes Lucile Shutters, MD    metoprolol tartrate (LOPRESSOR) 25 MG tablet Take 25 mg by mouth daily.     Yes Historical Provider, MD  oxyCODONE-acetaminophen (PERCOCET) 10-325 MG per tablet Take 1 tablet by mouth every 4 (four) hours as needed for pain (severe pain). 05/06/11  Yes Lonna Cobb, NP  polyethylene glycol (MIRALAX / GLYCOLAX) packet Take 17 g by mouth daily as needed.     Yes Historical Provider, MD  PRESCRIPTION MEDICATION PT GETS CHEMO AT [facility]. LAST TREATMENT WAS 3 WEEKS AGO. NEXT TREATMENT IS DUE FOR May 12 2011    Yes Historical Provider, MD  prochlorperazine (COMPAZINE) 10 MG tablet Take 10 mg by mouth every 6 (six) hours as needed.     Yes Lucile Shutters, MD    Allergies:   Allergies  Allergen Reactions  . Hydromorphone Itching  . Demerol     Itching     Social History:  reports that he has been smoking Cigarettes.  He has never used smokeless tobacco. He reports that he does not drink alcohol or use illicit drugs.  Family History: Family History  Problem Relation Age of Onset  . Cancer Mother   . Cancer Sister   . Cancer Sister     Physical Exam: Filed Vitals:   05/11/11 1201 05/11/11 1356 05/11/11 1540 05/11/11 1709  BP: 150/79 149/83 147/81 146/98  Pulse: 77 84 84 96  Temp: 98.9 F (37.2 C) 98.8 F (37.1 C) 99.2 F (37.3 C)   TempSrc: Oral Oral Oral   Resp: 17 17 16 18   SpO2: 98% 97% 96% 98%   General appearance: alert, cooperative and mild distress Head: Normocephalic, without obvious abnormality, atraumatic  Eyes: conjunctivae/corneas clear. PERRL, EOM's intact. Fundi benign. Throat: lips, mucosa, and tongue normal; teeth and gums normal Resp: clear to auscultation bilaterally Cardio: regular rate and rhythm, S1, S2 normal, no murmur, click, rub or gallop GI: Exquisitely tender to light touch and the peri-umbilical and lower quadrants bilaterally.  There is a palpable 4 cm mass in the right upper quadrant Extremities: extremities normal, atraumatic, no cyanosis or  edema Pulses: 2+ and symmetric Skin: Skin color, texture, turgor normal. No rashes or lesions Lymph nodes: Cervical, supraclavicular, and axillary nodes normal.   Labs on Admission:   [facility] 05/11/11 1335  NA 131*  K 3.6  CL 96  CO2 28  GLUCOSE 108*  BUN 9  CREATININE 0.55  CALCIUM 9.2  MG --  PHOS --    Basename 05/11/11 1335  AST 127*  ALT 35  ALKPHOS 178*  BILITOT 0.3  PROT 7.2  ALBUMIN 2.9*    Basename 05/11/11 1335  LIPASE 170*  AMYLASE --    Basename 05/11/11 1335  WBC 19.0*  NEUTROABS 15.2*  HGB 12.6*  HCT 37.8*  MCV 84.2  PLT 236   Radiological Exams on Admission: Mr Lumbar Spine W Wo Contrast  05/06/2011  *RADIOLOGY REPORT*  Clinical Data: Pancreatic cancer.  Back and leg pain on the right.  MRI LUMBAR SPINE WITHOUT AND WITH CONTRAST  Technique:  Multiplanar and multiecho pulse sequences of the lumbar spine were obtained without and with intravenous contrast.  Contrast:  15 ml Multihance  Comparison: CT abdomen 01/31/2011.  MRI lumbar spine 11/15/2010.  Findings: There is no abnormality at T12-L1 or L1-2.  The distal cord and conus are normal with conus tip at T12-L1.  L2-3:  Circumferential bulging of the disc.  No compressive stenosis.  L3-4:  Degeneration of the disc with loss of height. Circumferential bulging, focally prominent in the foraminal to extra foraminal regions right more than left.  There is reactive discogenic edema within the endplates of L3 and L4, which could be associated with back pain.  L4-5:  Degeneration of the disc with circumferential protrusion more pronounced in the right posterolateral direction.  Focal herniation of disc material into the right lateral recess and intervertebral foramen on the right could compress the right L4 and L5 nerve roots.  L5-S1:  Shallow protrusion of the disc.  Mild facet hypertrophy. No compressive stenosis.  Previously seen liver lesions again noted.  I do not see evidence of osseous metastatic  disease.  Enlarging pelvic mass is also visible.  IMPRESSION: L2-3:  Non compressive disc bulge.  L3-4:  Circumferential bulging of the disc focally prominent in the foraminal to extra foraminal regions right more than left.  Similar appearance to the previous study.  Discogenic edema could relate to back pain.  L4-5:  Right posterolateral and foraminal disc herniation. Fragment in the right lateral recess and intervertebral foramen would be expected to compress the right L4 and possibly the L5 nerve roots.  This disease has worsened slightly since July.  L5-S1:  Non compressive shallow disc protrusion.  Soft tissue metastatic disease previously evaluated by CT.  The pelvic mass appears to be enlarging.  No discernible osseous or intraspinal metastatic disease.  Original Report Authenticated By: Thomasenia Sales, M.D.   Ct Abdomen Pelvis W Contrast  05/11/2011  *RADIOLOGY REPORT*  Clinical Data: Abdominal pain and constipation.  Nausea vomiting. History of pancreatic cancer.  CT ABDOMEN AND PELVIS WITH CONTRAST  Technique:  Multidetector CT imaging of the abdomen and  pelvis was performed following the standard protocol during bolus administration of intravenous contrast.  Contrast: OMNIPAQUE IOHEXOL 300 MG/ML IV SOLN  Comparison: 01/31/2011  Findings: Interval decrease in the metastatic lesion along the right liver, measuring 1.5 x 1.1 cm today compared 1.9 x 4.6 cm previously.  There is a tiny amount of fluid adjacent to the liver. The spleen is unremarkable.  The stomach shows evidence of surgery with gastrojejunostomy.  Biliary enteric anastomosis is again noted.  1.8 cm soft tissue lesion just inferior to the liver on the previous study has decreased to 1.4 cm.  There used and the enhancing mass in the right paramidline anterior abdominal wall has decreased from 5.1 x 4.6 cm previously to 4.2 x 3.0 cm currently.  The pancreatic head and body are surgically absent.  The adrenal glands are normal.  The  kidneys are normal.  There is some interloop mesenteric fluid with diffuse mesenteric edema, as before.  No evidence for bowel obstruction.  Imaging through the pelvis shows marked progression of the to pelvic masses noted previously.  The one on the left measures 8.0 x 5.1 cm today compared to 4.3 x 3.1 cm previously.  The one on the right measures th 8.2 x 4.2 cm today compared to 4.7 x 3.2 cm previously.  These mass lesion is displaced the distal sigmoid colon and rectosigmoid junction posteriorly and superiorly.  The patient is status post right hip replacement.  No worrisome lytic or sclerotic osseous abnormality.  IMPRESSION: Mixed interval response.  Multiple lesions around the liver and in the anterior upper abdominal wall has decreased in size in the interval.  To central pelvic lesions, however, have progressed substantially in the interval and are not generating prominent mass effect on the rectosigmoid junction.  There is some prominent stool along the course of the colon, but the pelvic lesions do not appear to be overtly obstructing the colon this time.  Original Report Authenticated By: ERIC A. MANSELL, M.D.   Dg Abd Acute W/chest  05/11/2011  *RADIOLOGY REPORT*  Clinical Data: Abdominal pain, nausea, vomiting, no bowel movement, history pancreatic cancer post chemotherapy, hypertension, shortness of breath, smoker  ACUTE ABDOMEN SERIES (ABDOMEN 2 VIEW & CHEST 1 VIEW)  Comparison: 11/17/2009  Findings: Right side Port-A-Cath, tip projecting over SVC. Normal heart size, mediastinal contours, and pulmonary vascularity. Mild emphysematous and chronic bronchitic changes. No pulmonary infiltrate, pleural effusion, or pneumothorax. Nonobstructive bowel gas pattern. Stool throughout transverse and proximal descending colon. No bowel dilatation, bowel wall thickening or free intraperitoneal air. Scattered degenerative disc disease changes lumbar spine. Right hip prosthesis. No acute urinary tract  calcification. Staple lines noted in the left mid abdomen from prior bowel surgery.  IMPRESSION: Emphysematous and chronic bronchitic changes. Nonobstructive bowel gas pattern.  Original Report Authenticated By: Lollie Marrow, M.D.    Assessment/Plan  #1 pancreatitis #2 constipation #3 metastatic pancreatic cancer #4 hypertension  I consulted with Dr. Darrold Span of hematology/oncology regarding the reliability of lipase in this patient.   After treatment the patient's lipase level should be reliable in the diagnosis of pancreatitis. I asked her to consult on and to continue to follow the patient. I will admit the patient to medical floor and keep the patient n.p.o. We'll continue with proton pump inhibitor and pain management. We'll start the patient on fluid rehydration. Patient is full code. Lovenox for DVT prophylaxis.  STINSON, JACOB JEHIEL 05/11/2011, 7:06 PM

## 2011-05-11 NOTE — ED Notes (Signed)
Patient transported to X-ray 

## 2011-05-11 NOTE — ED Notes (Signed)
Pt states "vomiting all night, haven't had a BM x 3 days, take miralax, was supposed to have chemo tomorrow, porta cath right upper chest"

## 2011-05-11 NOTE — Telephone Encounter (Signed)
On call: [facility] hospitalist called 1945 to let us know that pt is being admitted to their service with abdominal pain and elevated lipase; he is for treatment at [facility] 12-28 which will need to be cancelled, and I have LM for RN about this. Hospitalist requests our service see patient tomorrow.

## 2011-05-11 NOTE — ED Notes (Signed)
MD at bedside. 

## 2011-05-11 NOTE — ED Provider Notes (Signed)
History     CSN: [id number]  Arrival date & time 05/11/11  1059   First MD Initiated Contact with Patient 05/11/11 1111      Chief Complaint  Patient presents with  . Abdominal Pain  . Nausea  . Emesis    (Consider location/radiation/quality/duration/timing/severity/associated sxs/prior treatment) HPI Comments: Patient reports his stomach feels "like its going to explode."  States he has been unable to have a bowel movement x 3 days, associated nausea and vomiting, fever to 101 this morning.  Emesis is yellow and green.  Pt is able to pass flatus.  Denies urinary symptoms.  Patient is scheduled to have chemotherapy tomorrow for pancreatic cancer.  Last chemo was several weeks ago.  Oncologist is Dr Myrle Sheng. S/P whipple procedure.  The history is provided by the patient.    Past Medical History  Diagnosis Date  . Diarrhea   . Nausea   . Rash     on abdomen from radiation   . Chronic back pain     r/t disc disease--w/bilateral leg pain  . History of drug abuse     Remote--heroin & cocaine  . Pancreatic cancer 03/2010    poorly differentiated adenocarcinoma  . 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 Hypertension     Past Surgical History  Procedure Date  . Whipple procedure 02/08/2010  . Portacath placement 01/06/11    8/15/12tip in superior cavoatrial junction    Family History  Problem Relation Age of Onset  . Cancer Mother   . Cancer Sister   . Cancer Sister     History  Substance Use Topics  . Smoking status: Current Everyday Smoker    Types: Cigarettes  . Smokeless tobacco: Never Used  . Alcohol Use: No     occasional basis      Review of Systems  All other systems reviewed and are negative.    Allergies  Hydromorphone and Demerol  Home Medications   Current Outpatient Rx  Name Route Sig Dispense Refill  . LIDOCAINE-PRILOCAINE 2.5-2.5 % EX CREA Topical Apply topically as needed.      Marland Kitchen METOPROLOL TARTRATE 25 MG PO TABS Oral  Take 25 mg by mouth daily.      . OXYCODONE-ACETAMINOPHEN 10-325 MG PO TABS Oral Take 1 tablet by mouth every 4 (four) hours as needed for pain (severe pain). 42 tablet 0    Handwritten by Lonna Cobb, NP on 05/06/11  . POLYETHYLENE GLYCOL 3350 PO PACK Oral Take 17 g by mouth daily as needed.      Marland Kitchen PRESCRIPTION MEDICATION  PT GETS CHEMO AT [facility]. LAST TREATMENT WAS 3 WEEKS AGO. NEXT TREATMENT IS DUE FOR May 12 2011     . PROCHLORPERAZINE MALEATE 10 MG PO TABS Oral Take 10 mg by mouth every 6 (six) hours as needed.        BP 151/84  Pulse 83  Temp(Src) 98.6 F (37 C) (Oral)  Resp 21  SpO2 100%  Physical Exam  Vitals reviewed. Constitutional: He is oriented to person, place, and time. He appears well-developed and well-nourished.  HENT:  Head: Normocephalic and atraumatic.  Neck: Neck supple.  Cardiovascular: Normal rate, regular rhythm and normal heart sounds.   Pulmonary/Chest: Breath sounds normal. No respiratory distress. He has no wheezes. He has no rales. He exhibits no tenderness.  Abdominal: Soft. Bowel sounds are normal. He exhibits mass. He exhibits no distension. There is generalized tenderness. There is no rebound and no guarding.  Genitourinary: Rectum normal.       No fecal impaction  Neurological: He is alert and oriented to person, place, and time.    ED Course  Procedures (including critical care time)  11:32 AM Patient seen and examined, labs, xray, pain medication ordered.  Patients states to me that he has no drug allergies.    4:49 PM Discussed results with Dr Effie Shy.  Plan is for admission for pain control.    Labs Reviewed  CBC - Abnormal; Notable for the following:    WBC 19.0 (*)    Hemoglobin 12.6 (*)    HCT 37.8 (*)    All other components within normal limits  DIFFERENTIAL - Abnormal; Notable for the following:    Neutrophils Relative 80 (*)    Neutro Abs 15.2 (*)    Lymphocytes Relative 8 (*)    Monocytes Absolute 2.3 (*)    All other components  within normal limits  COMPREHENSIVE METABOLIC PANEL - Abnormal; Notable for the following:    Sodium 131 (*)    Glucose, Bld 108 (*)    Albumin 2.9 (*)    AST 127 (*)    Alkaline Phosphatase 178 (*)    All other components within normal limits  LIPASE, BLOOD - Abnormal; Notable for the following:    Lipase 170 (*)    All other components within normal limits   Ct Abdomen Pelvis W Contrast  05/11/2011  *RADIOLOGY REPORT*  Clinical Data: Abdominal pain and constipation.  Nausea vomiting. History of pancreatic cancer.  CT ABDOMEN AND PELVIS WITH CONTRAST  Technique:  Multidetector CT imaging of the abdomen and pelvis was performed following the standard protocol during bolus administration of intravenous contrast.  Contrast: OMNIPAQUE IOHEXOL 300 MG/ML IV SOLN  Comparison: 01/31/2011  Findings: Interval decrease in the metastatic lesion along the right liver, measuring 1.5 x 1.1 cm today compared 1.9 x 4.6 cm previously.  There is a tiny amount of fluid adjacent to the liver. The spleen is unremarkable.  The stomach shows evidence of surgery with gastrojejunostomy.  Biliary enteric anastomosis is again noted.  1.8 cm soft tissue lesion just inferior to the liver on the previous study has decreased to 1.4 cm.  There used and the enhancing mass in the right paramidline anterior abdominal wall has decreased from 5.1 x 4.6 cm previously to 4.2 x 3.0 cm currently.  The pancreatic head and body are surgically absent.  The adrenal glands are normal.  The kidneys are normal.  There is some interloop mesenteric fluid with diffuse mesenteric edema, as before.  No evidence for bowel obstruction.  Imaging through the pelvis shows marked progression of the to pelvic masses noted previously.  The one on the left measures 8.0 x 5.1 cm today compared to 4.3 x 3.1 cm previously.  The one on the right measures th 8.2 x 4.2 cm today compared to 4.7 x 3.2 cm previously.  These mass lesion is displaced the distal  sigmoid colon and rectosigmoid junction posteriorly and superiorly.  The patient is status post right hip replacement.  No worrisome lytic or sclerotic osseous abnormality.  IMPRESSION: Mixed interval response.  Multiple lesions around the liver and in the anterior upper abdominal wall has decreased in size in the interval.  To central pelvic lesions, however, have progressed substantially in the interval and are not generating prominent mass effect on the rectosigmoid junction.  There is some prominent stool along the course of the colon, but the pelvic lesions  do not appear to be overtly obstructing the colon at this time.  Original Report Authenticated By: ERIC A. MANSELL, M.D.   Dg Abd Acute W/chest  05/11/2011  *RADIOLOGY REPORT*  Clinical Data: Abdominal pain, nausea, vomiting, no bowel movement, history pancreatic cancer post chemotherapy, hypertension, shortness of breath, smoker  ACUTE ABDOMEN SERIES (ABDOMEN 2 VIEW & CHEST 1 VIEW)  Comparison: 11/17/2009  Findings: Right side Port-A-Cath, tip projecting over SVC. Normal heart size, mediastinal contours, and pulmonary vascularity. Mild emphysematous and chronic bronchitic changes. No pulmonary infiltrate, pleural effusion, or pneumothorax. Nonobstructive bowel gas pattern. Stool throughout transverse and proximal descending colon. No bowel dilatation, bowel wall thickening or free intraperitoneal air. Scattered degenerative disc disease changes lumbar spine. Right hip prosthesis. No acute urinary tract calcification. Staple lines noted in the left mid abdomen from prior bowel surgery.  IMPRESSION: Emphysematous and chronic bronchitic changes. Nonobstructive bowel gas pattern.  Original Report Authenticated By: Lollie Marrow, M.D.     1. Abdominal pain   2. Metastatic cancer   3. Constipation   4. Nausea and vomiting       MDM  Patient with hx pancreatic cancer p/w abdominal pain, constipation, N/V, fevers at home.  CT shows no obstruction,  various response of tumors to chemotherapy.  Admitted for pain control, treatment of constipation.          Dillard Cannon [place], [place] 05/11/11 0981

## 2011-05-11 NOTE — Progress Notes (Signed)
Pt. Received per cart from ER.  Pt. Ambulated from hallway to bed.  Pt. Oriented to room.  Pt. States he just received pain medication in ER  (6mg  Morphine)  Pain improved from 10 to 7.  Pt refused Fentanyl drip in ER, refuses Fentanyl drip here on floor.

## 2011-05-11 NOTE — ED Notes (Signed)
Patient refused fentenyl gtt.  Dr Effie Shy notified for morphine order received and given.  Report called to ron on 4e

## 2011-05-11 NOTE — ED Notes (Signed)
Transferred to 1319

## 2011-05-11 NOTE — Telephone Encounter (Signed)
Called patient back and told him his pain could best be worked up quicker today by going to emergency room. Suggested he go to the [facility] emergency room so Dr. Truett Perna can see him easier if he is admitted.

## 2011-05-11 NOTE — Telephone Encounter (Signed)
Received a phone call from [name] reporting severe abdominal pain. He stated he has not had a bowel movement in 2 days despite taking miralax at least 3 times.  He says the pain is so great it is effecting his whole body. He is calling to ask Dr. Truett Perna if he should go to the emergency room.  He also reports he had a fever last night. He is not able to tell me what it was bust says his neighbor took it and it was 101 or 102.

## 2011-05-11 NOTE — ED Notes (Signed)
Vital signs stable. 

## 2011-05-11 NOTE — ED Notes (Signed)
Patient is resting comfortably. 

## 2011-05-12 ENCOUNTER — Inpatient Hospital Stay: Payer: Medicaid Other

## 2011-05-12 ENCOUNTER — Other Ambulatory Visit: Payer: Medicaid Other

## 2011-05-12 ENCOUNTER — Encounter (HOSPITAL_COMMUNITY): Payer: Self-pay

## 2011-05-12 ENCOUNTER — Other Ambulatory Visit: Payer: Self-pay | Admitting: Oncology

## 2011-05-12 LAB — BASIC METABOLIC PANEL
Calcium: 8.9 mg/dL (ref 8.4–10.5)
Chloride: 95 mEq/L — ABNORMAL LOW (ref 96–112)
Creatinine, Ser: 0.66 mg/dL (ref 0.50–1.35)
GFR calc Af Amer: >90 mL/min (ref >90)

## 2011-05-12 LAB — LIPASE, BLOOD: Lipase: 175 U/L — ABNORMAL HIGH (ref 11–59)

## 2011-05-12 LAB — CBC
Platelets: 214 10*3/uL (ref 150–400)
RDW: 15.4 % (ref 11.5–15.5)
WBC: 14 10*3/uL — ABNORMAL HIGH (ref 4.0–10.5)

## 2011-05-12 MED ORDER — POLYETHYLENE GLYCOL 3350 17 G PO PACK: 17.0000 g | PACK | Freq: Every day | ORAL | Status: DC | PRN

## 2011-05-12 MED ORDER — MORPHINE SULFATE 4 MG/ML IJ SOLN
3.0000 mg | INTRAMUSCULAR | Status: DC | PRN
  Administered 2011-05-12: 3 mg via INTRAVENOUS
  Filled 2011-05-12: qty 1

## 2011-05-12 MED ORDER — POLYETHYLENE GLYCOL 3350 17 G PO PACK
17.0000 g | PACK | Freq: Every day | ORAL | Status: DC
  Administered 2011-05-12 – 2011-05-16 (×3): 17 g via ORAL
  Filled 2011-05-12 (×9): qty 1

## 2011-05-12 MED ORDER — SODIUM CHLORIDE 0.9 % IV SOLN
INTRAVENOUS | Status: DC
  Administered 2011-05-15 – 2011-05-18 (×3): via INTRAVENOUS

## 2011-05-12 MED ORDER — MORPHINE SULFATE 4 MG/ML IJ SOLN
2.0000 mg | INTRAMUSCULAR | Status: DC | PRN
  Administered 2011-05-12 (×2): 2 mg via INTRAVENOUS
  Filled 2011-05-12 (×2): qty 1

## 2011-05-12 MED ORDER — PROCHLORPERAZINE MALEATE 10 MG PO TABS: 10.0000 mg | ORAL_TABLET | Freq: Four times a day (QID) | ORAL | Status: DC | PRN

## 2011-05-12 MED ORDER — MORPHINE SULFATE 10 MG/ML IJ SOLN
6.0000 mg | INTRAMUSCULAR | Status: DC | PRN
  Filled 2011-05-12: qty 1

## 2011-05-12 MED ORDER — MORPHINE SULFATE 10 MG/ML IJ SOLN
8.0000 mg | INTRAMUSCULAR | Status: DC | PRN
  Administered 2011-05-12 – 2011-05-20 (×72): 8 mg via INTRAVENOUS
  Filled 2011-05-12 (×73): qty 1

## 2011-05-12 MED ORDER — ONDANSETRON HCL 4 MG/2ML IJ SOLN: 4.0000 mg | Freq: Four times a day (QID) | INTRAMUSCULAR | Status: DC | PRN

## 2011-05-12 MED ORDER — SENNA 8.6 MG PO TABS
1.0000 | ORAL_TABLET | Freq: Two times a day (BID) | ORAL | Status: DC
  Administered 2011-05-12 – 2011-05-20 (×14): 8.6 mg via ORAL
  Filled 2011-05-12 (×16): qty 1

## 2011-05-12 MED ORDER — METOPROLOL TARTRATE 25 MG PO TABS
25.0000 mg | ORAL_TABLET | Freq: Every day | ORAL | Status: DC
  Administered 2011-05-12 – 2011-05-20 (×9): 25 mg via ORAL
  Filled 2011-05-12 (×9): qty 1

## 2011-05-12 MED ORDER — ONDANSETRON HCL 4 MG PO TABS: 4.0000 mg | ORAL_TABLET | Freq: Four times a day (QID) | ORAL | Status: DC | PRN

## 2011-05-12 MED ORDER — ENOXAPARIN SODIUM 40 MG/0.4ML ~~LOC~~ SOLN
40.0000 mg | SUBCUTANEOUS | Status: DC
  Filled 2011-05-12 (×9): qty 0.4

## 2011-05-12 NOTE — Progress Notes (Signed)
UR CHART REVIEWED; B Blu Mcglaun RN, BSN, MHA 

## 2011-05-12 NOTE — Progress Notes (Signed)
Subjective: Pt currently states that he is in pain.  Has right lower back pain.  He has had this for awhile.  Morphine is helping but patient is requesting pca pump.  Also states that he has been constipated.  Mentions that his medication was increased recently to help him have a bowel movement.  Objective: Filed Vitals:   05/11/11 1931 05/11/11 2040 05/12/11 0548 05/12/11 1320  BP: 136/74 139/83 132/78 126/74  Pulse: 84 90 94 84  Temp: 100.4 F (38 C) 98.8 F (37.1 C) 99.1 F (37.3 C) 98.9 F (37.2 C)  TempSrc: Oral Oral Oral Oral  Resp: 14 18 16 16   Height:  6\' 1"  (1.854 m)    Weight:  69 kg (152 lb 1.9 oz)    SpO2: 98% 100% 97% 98%   Weight change:   Intake/Output Summary (Last 24 hours) at 05/12/11 1514 Last data filed at 05/12/11 1300  Gross per 24 hour  Intake    360 ml  Output      0 ml  Net    360 ml    General: Alert, awake, oriented x3, in no acute distress. HEENT: No bruits, no goiter.  Heart: Regular rate and rhythm, without murmurs, rubs, gallops.  Lungs: Crackles left side, bilateral air movement.  Abdomen: Abdominal discomfort at epigastric area  Neuro: Grossly intact, nonfocal.   Lab Results:  [facility] 05/12/11 0530 05/11/11 1335  NA 132* 131*  K 3.6 3.6  CL 95* 96  CO2 29 28  GLUCOSE 114* 108*  BUN 9 9  CREATININE 0.66 0.55  CALCIUM 8.9 9.2  MG -- --  PHOS -- --    Basename 05/11/11 1335  AST 127*  ALT 35  ALKPHOS 178*  BILITOT 0.3  PROT 7.2  ALBUMIN 2.9*    Basename 05/12/11 0530 05/11/11 1335  LIPASE 175* 170*  AMYLASE -- --    Basename 05/12/11 0530 05/11/11 1335  WBC 14.0* 19.0*  NEUTROABS -- 15.2*  HGB 12.3* 12.6*  HCT 37.2* 37.8*  MCV 83.8 84.2  PLT 214 236   No results found for this basename: CKTOTAL:3,CKMB:3,CKMBINDEX:3,TROPONINI:3 in the last 72 hours No components found with this basename: POCBNP:3 No results found for this basename: DDIMER:2 in the last 72 hours No results found for this basename: HGBA1C:2 in  the last 72 hours No results found for this basename: CHOL:2,HDL:2,LDLCALC:2,TRIG:2,CHOLHDL:2,LDLDIRECT:2 in the last 72 hours No results found for this basename: TSH,T4TOTAL,FREET3,T3FREE,THYROIDAB in the last 72 hours No results found for this basename: VITAMINB12:2,FOLATE:2,FERRITIN:2,TIBC:2,IRON:2,RETICCTPCT:2 in the last 72 hours  Micro Results: No results found for this or any previous visit (from the past 240 hour(s)).  Studies/Results: Ct Abdomen Pelvis W Contrast  05/11/2011  *RADIOLOGY REPORT*  Clinical Data: Abdominal pain and constipation.  Nausea vomiting. History of pancreatic cancer.  CT ABDOMEN AND PELVIS WITH CONTRAST  Technique:  Multidetector CT imaging of the abdomen and pelvis was performed following the standard protocol during bolus administration of intravenous contrast.  Contrast: OMNIPAQUE IOHEXOL 300 MG/ML IV SOLN  Comparison: 01/31/2011  Findings: Interval decrease in the metastatic lesion along the right liver, measuring 1.5 x 1.1 cm today compared 1.9 x 4.6 cm previously.  There is a tiny amount of fluid adjacent to the liver. The spleen is unremarkable.  The stomach shows evidence of surgery with gastrojejunostomy.  Biliary enteric anastomosis is again noted.  1.8 cm soft tissue lesion just inferior to the liver on the previous study has decreased to 1.4 cm.  There used  and the enhancing mass in the right paramidline anterior abdominal wall has decreased from 5.1 x 4.6 cm previously to 4.2 x 3.0 cm currently.  The pancreatic head and body are surgically absent.  The adrenal glands are normal.  The kidneys are normal.  There is some interloop mesenteric fluid with diffuse mesenteric edema, as before.  No evidence for bowel obstruction.  Imaging through the pelvis shows marked progression of the to pelvic masses noted previously.  The one on the left measures 8.0 x 5.1 cm today compared to 4.3 x 3.1 cm previously.  The one on the right measures th 8.2 x 4.2 cm today  compared to 4.7 x 3.2 cm previously.  These mass lesion is displaced the distal sigmoid colon and rectosigmoid junction posteriorly and superiorly.  The patient is status post right hip replacement.  No worrisome lytic or sclerotic osseous abnormality.  IMPRESSION: Mixed interval response.  Multiple lesions around the liver and in the anterior upper abdominal wall has decreased in size in the interval.  To central pelvic lesions, however, have progressed substantially in the interval and are not generating prominent mass effect on the rectosigmoid junction.  There is some prominent stool along the course of the colon, but the pelvic lesions do not appear to be overtly obstructing the colon at this time.  Original Report Authenticated By: ERIC A. MANSELL, M.D.   Dg Abd Acute W/chest  05/11/2011  *RADIOLOGY REPORT*  Clinical Data: Abdominal pain, nausea, vomiting, no bowel movement, history pancreatic cancer post chemotherapy, hypertension, shortness of breath, smoker  ACUTE ABDOMEN SERIES (ABDOMEN 2 VIEW & CHEST 1 VIEW)  Comparison: 11/17/2009  Findings: Right side Port-A-Cath, tip projecting over SVC. Normal heart size, mediastinal contours, and pulmonary vascularity. Mild emphysematous and chronic bronchitic changes. No pulmonary infiltrate, pleural effusion, or pneumothorax. Nonobstructive bowel gas pattern. Stool throughout transverse and proximal descending colon. No bowel dilatation, bowel wall thickening or free intraperitoneal air. Scattered degenerative disc disease changes lumbar spine. Right hip prosthesis. No acute urinary tract calcification. Staple lines noted in the left mid abdomen from prior bowel surgery.  IMPRESSION: Emphysematous and chronic bronchitic changes. Nonobstructive bowel gas pattern.  Original Report Authenticated By: Lollie Marrow, M.D.    Medications: I have reviewed the patient's current medications.  1) Abdominal discomfort:  Etiology uncertain? Pt has discomfort at  epigastric area with elevated lipase levels.  Based on history of pancreatic metastatic carcinoma oncology has been consulted. Pt does have history of constipation which may be playing a role in current symptomatology.  Will reorder lipase level.  Pt is to continue senna and miralax.  Will continue to follow.  2) Pain control given his history of metastatic pancreatic ca.  Will increase his morphine as patient mentions that pain is not well controlled.  3) Hypertension:  Stable at this juncture.  Patient Active Hospital Problem List: No active hospital problems.     LOS: 1 day   Penny Pia M.D.  [facility] 05/12/2011, 3:14 PM

## 2011-05-12 NOTE — Progress Notes (Signed)
IP PROGRESS NOTE  Subjective:   He was admitted yesterday with increased abdominal pain. He complains of increased pain at the low abdomen the past one week. He also reports constipation.  We saw him on December 19 for evaluation of pain in the right leg. An MRI evaluation revealed benign musculoskeletal disease at the lumbar spine without evidence of tumor involving the spine.  He has completed 3 cycles of FOLFOX chemotherapy. He is scheduled for cycle 4 today.  The low abdominal pain is not relieved with Percocet. He reports relief of the pain with morphine while in the emergency room yesterday.  Objective: Vital signs in last 24 hours: Blood pressure 132/78, pulse 94, temperature 99.1 F (37.3 C), temperature source Oral, resp. rate 16, height 6\' 1"  (1.854 m), weight 152 lb 1.9 oz (69 kg), SpO2 97.00%.     Physical Exam:  Abdomen: Soft. The right mid abdominal wall mass appears stable. There is tenderness in the low mid abdomen/suprapubic area. No mass. Extremities: No leg edema   Portacath/PICC-without erythema  Lab Results:  Basename 05/12/11 0530 05/11/11 1335  WBC 14.0* 19.0*  HGB 12.3* 12.6*  HCT 37.2* 37.8*  PLT 214 236    BMET  Basename 05/12/11 0530 05/11/11 1335  NA 132* 131*  K 3.6 3.6  CL 95* 96  CO2 29 28  GLUCOSE 114* 108*  BUN 9 9  CREATININE 0.66 0.55  CALCIUM 8.9 9.2    Studies/Results: Ct Abdomen Pelvis W Contrast  05/11/2011  *RADIOLOGY REPORT*  Clinical Data: Abdominal pain and constipation.  Nausea vomiting. History of pancreatic cancer.  CT ABDOMEN AND PELVIS WITH CONTRAST  Technique:  Multidetector CT imaging of the abdomen and pelvis was performed following the standard protocol during bolus administration of intravenous contrast.  Contrast: OMNIPAQUE IOHEXOL 300 MG/ML IV SOLN  Comparison: 01/31/2011  Findings: Interval decrease in the metastatic lesion along the right liver, measuring 1.5 x 1.1 cm today compared 1.9 x 4.6 cm  previously.  There is a tiny amount of fluid adjacent to the liver. The spleen is unremarkable.  The stomach shows evidence of surgery with gastrojejunostomy.  Biliary enteric anastomosis is again noted.  1.8 cm soft tissue lesion just inferior to the liver on the previous study has decreased to 1.4 cm.  There used and the enhancing mass in the right paramidline anterior abdominal wall has decreased from 5.1 x 4.6 cm previously to 4.2 x 3.0 cm currently.  The pancreatic head and body are surgically absent.  The adrenal glands are normal.  The kidneys are normal.  There is some interloop mesenteric fluid with diffuse mesenteric edema, as before.  No evidence for bowel obstruction.  Imaging through the pelvis shows marked progression of the to pelvic masses noted previously.  The one on the left measures 8.0 x 5.1 cm today compared to 4.3 x 3.1 cm previously.  The one on the right measures th 8.2 x 4.2 cm today compared to 4.7 x 3.2 cm previously.  These mass lesion is displaced the distal sigmoid colon and rectosigmoid junction posteriorly and superiorly.  The patient is status post right hip replacement.  No worrisome lytic or sclerotic osseous abnormality.  IMPRESSION: Mixed interval response.  Multiple lesions around the liver and in the anterior upper abdominal wall has decreased in size in the interval.  To central pelvic lesions, however, have progressed substantially in the interval and are not generating prominent mass effect on the rectosigmoid junction.  There is some prominent  stool along the course of the colon, but the pelvic lesions do not appear to be overtly obstructing the colon at this time.  Original Report Authenticated By: ERIC A. MANSELL, M.D.   Dg Abd Acute W/chest  05/11/2011  *RADIOLOGY REPORT*  Clinical Data: Abdominal pain, nausea, vomiting, no bowel movement, history pancreatic cancer post chemotherapy, hypertension, shortness of breath, smoker  ACUTE ABDOMEN SERIES (ABDOMEN 2 VIEW &  CHEST 1 VIEW)  Comparison: 11/17/2009  Findings: Right side Port-A-Cath, tip projecting over SVC. Normal heart size, mediastinal contours, and pulmonary vascularity. Mild emphysematous and chronic bronchitic changes. No pulmonary infiltrate, pleural effusion, or pneumothorax. Nonobstructive bowel gas pattern. Stool throughout transverse and proximal descending colon. No bowel dilatation, bowel wall thickening or free intraperitoneal air. Scattered degenerative disc disease changes lumbar spine. Right hip prosthesis. No acute urinary tract calcification. Staple lines noted in the left mid abdomen from prior bowel surgery.  IMPRESSION: Emphysematous and chronic bronchitic changes. Nonobstructive bowel gas pattern.  Original Report Authenticated By: Lollie Marrow, M.D.    Medications: I have reviewed the patient's current medications.  Assessment/Plan: 1. 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 Histology and immunohistochemical profile were consistent with a poorly-differentiated tumor with neuroendocrine and hepatoid features. The CA19-9 tumor marker was normal at 15.7 on 12/03/2010, and the CEA was normal at 1.7.  a. 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 b. Staging PET scan confirmed increased FDG activity associated with the right rectus sheath mass and multiple peritoneal metastases.  c. Initiation of gemcitabine chemotherapy on the [place] study 12/21/2010: He completed day 1 of cycle #2 on 01/18/2011.  d. Restating CT evaluation 01/31/2011 was consistent with disease progression.  e. Initiation of FOLFOX chemotherapy with cycle #1 given on 03/09/2011. He completed cycle 2 on 03/30/2011. He completed cycle 3 on 04/20/2011 with Neulasta support. 2. Pain secondary to the rectus sheath mass and  pelvic/peritoneal metastases: Improved following radiation to the right abdominal wall mass. He has been maintained on Percocet at home. He now has increased pain in the low abdomen, likely related to progressive metastatic disease involving pelvic masses.Marland Kitchen a. He completed palliative radiation to the abdominal rectus sheath mass on 02/21/2011. 3. 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 4. Remote history of heroin/cocaine use. 5. Ongoing tobacco use. 6. Port-A-Cath placement 01/06/2011. 7. History of neutropenia secondary to chemotherapy: He received Neulasta support after day 15/cycle #1 gemcitabine chemotherapy. Neulasta was added beginning with cycle 3 FOLFOX. 8. 1-1/2-2 week history of low back pain radiating to the right leg. He will begin Decadron 8 mg twice daily. We are referring him for an MRI of the lumbosacral spine. He was instructed to contact the office with worsening pain or onset of new symptoms. The MRI revealed no metastatic disease at the lumbosacral spine. Lumbar disc disease was noted. Enlargement of pelvic masses was noted.   He has metastatic pancreas cancer. The CT scan on December 26 confirms progressive metastatic disease involving pelvic masses. There has been improvement in the rectus sheath mass (this lesion was treated with radiation) and a mass in the right upper abdomen has also decreased in size. I suspect his pain is related to the pelvic masses. I discussed the CT scan findings with Mr. Wickens.   Recommendations: 1. Morphine sulfate for relief of pain 2. Initiate a bowel regimen, he may have early colonic  obstruction from the pelvic mass  3. Resume diet, I have a low clinical suspicion for acute pancreatitis     LOS: 1 day   [name]  05/12/2011, 9:41 AM

## 2011-05-12 NOTE — ED Provider Notes (Signed)
Medical screening examination/treatment/procedure(s) were conducted as a shared visit with non-physician practitioner(s) and myself.  I personally evaluated the patient during the encounter   Suzi Roots, MD 05/12/11 [phone]

## 2011-05-13 ENCOUNTER — Telehealth: Payer: Self-pay | Admitting: *Deleted

## 2011-05-13 ENCOUNTER — Encounter: Payer: Self-pay | Admitting: Radiation Oncology

## 2011-05-13 ENCOUNTER — Inpatient Hospital Stay (HOSPITAL_COMMUNITY): Payer: Medicaid Other

## 2011-05-13 ENCOUNTER — Ambulatory Visit
Admission: RE | Admit: 2011-05-13 | Discharge: 2011-05-13 | Disposition: A | Discharge status: Home / Self Care | Payer: Medicaid Other | Source: Ambulatory Visit | Attending: Radiation Oncology | Admitting: Radiation Oncology

## 2011-05-13 DIAGNOSIS — R109 Unspecified abdominal pain: Secondary | ICD-10-CM

## 2011-05-13 DIAGNOSIS — Z510 Encounter for antineoplastic radiation therapy: Secondary | ICD-10-CM | POA: Insufficient documentation

## 2011-05-13 DIAGNOSIS — I10 Essential (primary) hypertension: Secondary | ICD-10-CM | POA: Diagnosis present

## 2011-05-13 DIAGNOSIS — R1900 Intra-abdominal and pelvic swelling, mass and lump, unspecified site: Secondary | ICD-10-CM

## 2011-05-13 DIAGNOSIS — C50919 Malignant neoplasm of unspecified site of unspecified female breast: Secondary | ICD-10-CM | POA: Insufficient documentation

## 2011-05-13 DIAGNOSIS — C786 Secondary malignant neoplasm of retroperitoneum and peritoneum: Secondary | ICD-10-CM | POA: Insufficient documentation

## 2011-05-13 DIAGNOSIS — K5900 Constipation, unspecified: Secondary | ICD-10-CM | POA: Diagnosis present

## 2011-05-13 DIAGNOSIS — M545 Low back pain: Secondary | ICD-10-CM

## 2011-05-13 DIAGNOSIS — Y842 Radiological procedure and radiotherapy as the cause of abnormal reaction of the patient, or of later complication, without mention of misadventure at the time of the procedure: Secondary | ICD-10-CM | POA: Insufficient documentation

## 2011-05-13 DIAGNOSIS — C259 Malignant neoplasm of pancreas, unspecified: Secondary | ICD-10-CM | POA: Insufficient documentation

## 2011-05-13 DIAGNOSIS — C787 Secondary malignant neoplasm of liver and intrahepatic bile duct: Secondary | ICD-10-CM | POA: Insufficient documentation

## 2011-05-13 DIAGNOSIS — R110 Nausea: Secondary | ICD-10-CM | POA: Insufficient documentation

## 2011-05-13 DIAGNOSIS — C253 Malignant neoplasm of pancreatic duct: Secondary | ICD-10-CM

## 2011-05-13 MED ORDER — PANTOPRAZOLE SODIUM 40 MG PO TBEC
40.0000 mg | DELAYED_RELEASE_TABLET | Freq: Every day | ORAL | Status: DC
  Administered 2011-05-14 – 2011-05-16 (×3): 40 mg via ORAL
  Filled 2011-05-13 (×7): qty 1

## 2011-05-13 MED ORDER — MORPHINE SULFATE 15 MG PO TABS
15.0000 mg | ORAL_TABLET | ORAL | Status: DC | PRN
  Administered 2011-05-19 (×2): 15 mg via ORAL
  Filled 2011-05-13 (×3): qty 1

## 2011-05-13 NOTE — Progress Notes (Signed)
Pt meets criteria set by [employer] for auto switch to po protonix: Tolerating other po meds, tolerating diet, no GIB. Will change to po protonix.  Gwen Her PharmD  [phone] 05/13/2011 11:51 AM

## 2011-05-13 NOTE — Progress Notes (Signed)
Pt very agitated and angry at 2000 on 12/27. Expressed anger over poor pain management since hospitalization. He reported the doctor ordered 8mg  of morphine Q2 but the order read 6mg  of morphine Q2. Called the oncologist on call, Dr. Donnie Coffin, and increased dose to 8mg . Pt satisfied with pain management throughout night but expressed concern over switching to PO meds today 12/28. Will continue to dialogue w patient and meet needs. Eugene Garnet [facility] 05/13/2011 6:08 AM

## 2011-05-13 NOTE — Progress Notes (Signed)
IP PROGRESS NOTE  Subjective:   He continues to have pain at the low abdomen. The pain is relieved with IV morphine sulfate, but the relief is temporary. He continues to have constipation, but he feels that he may have a bowel movement today. He does not want an enema.  He complains of pain at the right thigh. He wonders whether this is related to lumbar disc disease, hip surgery, or potentially cancer. He request a consultation with Dr. Magnus Ivan.  Objective: Vital signs in last 24 hours: Blood pressure 121/78, pulse 94, temperature 98.3 F (36.8 C), temperature source Oral, resp. rate 18, height 6\' 1"  (1.854 m), weight 152 lb 1.9 oz (69 kg), SpO2 95.00%.  12/27 0701 - 12/28 0700 In: 600 [P.O.:600] Out: -   Physical Exam: HEENT: No thrush Lungs: Good air movement bilaterally. A few end inspiratory fine rails at the right posterior base. No respiratory distress. Cardiac: Regular rate and rhythm Abdomen: Soft. The right mid abdominal wall mass appears stable. There is tenderness in the low mid abdomen/suprapubic area. No mass. Extremities: No leg edema Muscular skeletal: There is tenderness with percussion at the right thigh. No rash, no mass, no erythema.   Portacath/PICC-without erythema  Lab Results:  Basename 05/12/11 0530 05/11/11 1335  WBC 14.0* 19.0*  HGB 12.3* 12.6*  HCT 37.2* 37.8*  PLT 214 236    BMET  Basename 05/12/11 0530 05/11/11 1335  NA 132* 131*  K 3.6 3.6  CL 95* 96  CO2 29 28  GLUCOSE 114* 108*  BUN 9 9  CREATININE 0.66 0.55  CALCIUM 8.9 9.2    Studies/Results: Ct Abdomen Pelvis W Contrast  05/11/2011  *RADIOLOGY REPORT*  Clinical Data: Abdominal pain and constipation.  Nausea vomiting. History of pancreatic cancer.  CT ABDOMEN AND PELVIS WITH CONTRAST  Technique:  Multidetector CT imaging of the abdomen and pelvis was performed following the standard protocol during bolus administration of intravenous contrast.  Contrast: OMNIPAQUE IOHEXOL  300 MG/ML IV SOLN  Comparison: 01/31/2011  Findings: Interval decrease in the metastatic lesion along the right liver, measuring 1.5 x 1.1 cm today compared 1.9 x 4.6 cm previously.  There is a tiny amount of fluid adjacent to the liver. The spleen is unremarkable.  The stomach shows evidence of surgery with gastrojejunostomy.  Biliary enteric anastomosis is again noted.  1.8 cm soft tissue lesion just inferior to the liver on the previous study has decreased to 1.4 cm.  There used and the enhancing mass in the right paramidline anterior abdominal wall has decreased from 5.1 x 4.6 cm previously to 4.2 x 3.0 cm currently.  The pancreatic head and body are surgically absent.  The adrenal glands are normal.  The kidneys are normal.  There is some interloop mesenteric fluid with diffuse mesenteric edema, as before.  No evidence for bowel obstruction.  Imaging through the pelvis shows marked progression of the to pelvic masses noted previously.  The one on the left measures 8.0 x 5.1 cm today compared to 4.3 x 3.1 cm previously.  The one on the right measures th 8.2 x 4.2 cm today compared to 4.7 x 3.2 cm previously.  These mass lesion is displaced the distal sigmoid colon and rectosigmoid junction posteriorly and superiorly.  The patient is status post right hip replacement.  No worrisome lytic or sclerotic osseous abnormality.  IMPRESSION: Mixed interval response.  Multiple lesions around the liver and in the anterior upper abdominal wall has decreased in size in the  interval.  To central pelvic lesions, however, have progressed substantially in the interval and are not generating prominent mass effect on the rectosigmoid junction.  There is some prominent stool along the course of the colon, but the pelvic lesions do not appear to be overtly obstructing the colon at this time.  Original Report Authenticated By: ERIC A. MANSELL, M.D.   Dg Abd Acute W/chest  05/11/2011  *RADIOLOGY REPORT*  Clinical Data: Abdominal  pain, nausea, vomiting, no bowel movement, history pancreatic cancer post chemotherapy, hypertension, shortness of breath, smoker  ACUTE ABDOMEN SERIES (ABDOMEN 2 VIEW & CHEST 1 VIEW)  Comparison: 11/17/2009  Findings: Right side Port-A-Cath, tip projecting over SVC. Normal heart size, mediastinal contours, and pulmonary vascularity. Mild emphysematous and chronic bronchitic changes. No pulmonary infiltrate, pleural effusion, or pneumothorax. Nonobstructive bowel gas pattern. Stool throughout transverse and proximal descending colon. No bowel dilatation, bowel wall thickening or free intraperitoneal air. Scattered degenerative disc disease changes lumbar spine. Right hip prosthesis. No acute urinary tract calcification. Staple lines noted in the left mid abdomen from prior bowel surgery.  IMPRESSION: Emphysematous and chronic bronchitic changes. Nonobstructive bowel gas pattern.  Original Report Authenticated By: Lollie Marrow, M.D.    Medications: I have reviewed the patient's current medications.  Assessment/Plan: 1. 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 Histology and immunohistochemical profile were consistent with a poorly-differentiated tumor with neuroendocrine and hepatoid features. The CA19-9 tumor marker was normal at 15.7 on 12/03/2010, and the CEA was normal at 1.7.  a. 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 b. Staging PET scan confirmed increased FDG activity associated with the right rectus sheath mass and multiple peritoneal metastases.  c. Initiation of gemcitabine chemotherapy on the Onconova study 12/21/2010: He completed day 1 of cycle #2 on 01/18/2011.  d. Restating CT evaluation 01/31/2011 was consistent with disease progression.  e. Initiation of FOLFOX chemotherapy with  cycle #1 given on 03/09/2011. He completed cycle 2 on 03/30/2011. He completed cycle 3 on 04/20/2011 with Neulasta support. 2. Pain secondary to the rectus sheath mass and pelvic/peritoneal metastases: Improved following radiation to the right abdominal wall mass. He has been maintained on Percocet at home. He now has increased pain in the low abdomen, likely related to progressive metastatic disease involving pelvic masses.Marland Kitchen a. He completed palliative radiation to the abdominal rectus sheath mass on 02/21/2011. 3. 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 4. Remote history of heroin/cocaine use. 5. Ongoing tobacco use. 6. Port-A-Cath placement 01/06/2011. 7. History of neutropenia secondary to chemotherapy: He received Neulasta support after day 15/cycle #1 gemcitabine chemotherapy. Neulasta was added beginning with cycle 3 FOLFOX. 8. 1-1/2-2 week history of low back pain radiating to the right leg. He was treated with Decadron 8 mg twice daily and noted partial improvement. The MRI revealed no metastatic disease at the lumbosacral spine. Lumbar disc disease was noted. Enlargement of pelvic masses was noted. The right thigh pain persists and may be related to lumbar disc disease, the pelvic masses, or potentially metastatic disease at the right thigh. 9. Constipation-secondary to narcotic analgesics versus ileus/partial obstruction due to pelvic tumor. He will continue a bowel regimen.     Recommendations: 1. Morphine sulfate for relief of pain, he will try MS IR today. 2. continue a bowel regimen for constipation.  3. I consulted  radiation oncology to consider peeling of radiation  to the pelvic masses 4. Consult Dr. Allie Bossier to evaluate the right thigh pain. I will order a plain x-ray of the right hip and femur.  Please call oncology as needed over the weekend. I will be out until January 7. I will ask Dr. Cyndie Chime to see him if he remains in the hospital beyond the weekend. We will arrange  for outpatient followup at the cancer Center.     LOS: 2 days   Charlestine Rookstool BRADLEY  05/13/2011, 8:49 AM

## 2011-05-13 NOTE — Consult Note (Signed)
  [name] is well known to me.  Has had consistent pain in his right thigh for some time now.  He is post a right total hip replacement.  I did review his latest x-rays of his hip and femur and see no complicating features of either the hip replacement nor is there any cortical irregularities to suggest a lytic lesion.  His MRI of the lumbar spine does show a large disc herniation at the right side at L4/L5.  This may be the source of his pain.  I did not feel any mass in his right thigh on exam.  He does report some numbness.  Plan: 1) I think he would benefit from an epidural steroid injection/nerve root injection by interventional radiology at RL4/5, however, this can not be done until Monday. 2) will follow.

## 2011-05-13 NOTE — Progress Notes (Addendum)
Subjective: No acute issues overnight.  Pt continue to have lower back discomfort.  Pt mentions that he wanted pca pump yesterday and was upset that I didn't place the order.  Objective: Filed Vitals:   05/12/11 1320 05/12/11 2145 05/13/11 0614 05/13/11 1533  BP: 126/74 133/79 121/78 156/93  Pulse: 84 92 94 97  Temp: 98.9 F (37.2 C) 98.5 F (36.9 C) 98.3 F (36.8 C) 98.8 F (37.1 C)  TempSrc: Oral Oral Oral Oral  Resp: 16 16 18 16   Height:      Weight:      SpO2: 98% 99% 95% 97%   Weight change:   Intake/Output Summary (Last 24 hours) at 05/13/11 1753 Last data filed at 05/12/11 2100  Gross per 24 hour  Intake    240 ml  Output      0 ml  Net    240 ml    General: Alert, awake, oriented x3, in no acute distress.  HEENT: neck is supple.  Heart: no edema.  Lungs: No increase work of breathing  Abdomen: no distension  Neuro: Pt responds to questions appropriately   Lab Results:  Basename 05/12/11 0530 05/11/11 1335  NA 132* 131*  K 3.6 3.6  CL 95* 96  CO2 29 28  GLUCOSE 114* 108*  BUN 9 9  CREATININE 0.66 0.55  CALCIUM 8.9 9.2  MG -- --  PHOS -- --    Basename 05/11/11 1335  AST 127*  ALT 35  ALKPHOS 178*  BILITOT 0.3  PROT 7.2  ALBUMIN 2.9*    Basename 05/12/11 0530 05/11/11 1335  LIPASE 175* 170*  AMYLASE -- --    Basename 05/12/11 0530 05/11/11 1335  WBC 14.0* 19.0*  NEUTROABS -- 15.2*  HGB 12.3* 12.6*  HCT 37.2* 37.8*  MCV 83.8 84.2  PLT 214 236   No results found for this basename: CKTOTAL:3,CKMB:3,CKMBINDEX:3,TROPONINI:3 in the last 72 hours No components found with this basename: POCBNP:3 No results found for this basename: DDIMER:2 in the last 72 hours No results found for this basename: HGBA1C:2 in the last 72 hours No results found for this basename: CHOL:2,HDL:2,LDLCALC:2,TRIG:2,CHOLHDL:2,LDLDIRECT:2 in the last 72 hours No results found for this basename: TSH,T4TOTAL,FREET3,T3FREE,THYROIDAB in the last 72 hours No results found  for this basename: VITAMINB12:2,FOLATE:2,FERRITIN:2,TIBC:2,IRON:2,RETICCTPCT:2 in the last 72 hours  Micro Results: No results found for this or any previous visit (from the past 240 hour(s)).  Studies/Results: Dg Hip Complete Right  05/13/2011  *RADIOLOGY REPORT*  Clinical Data: Progressive right thigh pain status post total hip arthroplasty approximately 9 months ago.  History of pancreatic cancer.  RIGHT HIP - COMPLETE 2+ VIEW  Comparison: Pelvic CT 05/11/2011 and PET CT 12/08/2010.  Findings: Patient is status post right total hip arthroplasty. There is mild heterotopic ossification lateral to the hip joint. No acute fracture, dislocation or prosthetic loosening is identified.  There is no evidence of lytic or blastic lesion. There are mild degenerative changes at the left hip and symphysis pubis.  IMPRESSION: No acute osseous findings or evidence of osseous metastatic disease.  The progressive intrapelvic tumor demonstrated on CT could contribute to the patient's symptoms.  Original Report Authenticated By: Gerrianne Scale, M.D.   Dg Femur Right  05/13/2011  *RADIOLOGY REPORT*  Clinical Data: Progressive right thigh pain status post total hip arthroplasty approximately 9 months ago.  History of pancreatic cancer.  RIGHT FEMUR - 2 VIEW  Comparison: Pelvic CT 05/11/2011 and PET CT 12/08/2010.  Findings: The proximal femur is included  on the hip radiographs done concurrently.  The distal femur appears normal without evidence of fracture or metastatic disease.  The distal end of the femoral prosthesis is partially imaged on these views.  IMPRESSION: No distal right femoral abnormality.  Original Report Authenticated By: Gerrianne Scale, M.D.    Medications: I have reviewed the patient's current medications.  Constipation:  Patient is on Miralax and senna.  HTN:  Last reading elevated today may be secondary to pain.  At this point will continue to monitor patient is asymptomatic.    Lower  back pain:  Increased morphine dose yesterday but currently being managed by heme/onc will f/u.  Pt was upset today and was raising his voice at me.  I asked the patient what I could do to help and he stated that there was nothing at this moment and that his other issues have been addressed.  The patient was angry and this could be secondary to what he is going through.  I tried to speak to him but he didn't want to talk to me any more.   LOS: 2 days   Penny Pia M.D.  [facility] 05/13/2011, 5:53 PM

## 2011-05-13 NOTE — Progress Notes (Signed)
  Radiation Oncology         [phone]) [phone] ________________________________  Name: [name] MRN: [id number]  Date: 05/13/2011  DOB: 1950/11/06  FOLLOW-UP and SIMULATION AND TREATMENT PLANNING NOTE  DIAGNOSIS:  60 year old gentleman with metastatic pancreatic cancer and painful masses involving the pelvis.  BACKGROUND: Mr. Damron is a very nice 60 year old gentleman who has a history of metastatic pancreatic cancer. He is received palliative radiation to the upper abdomen for enlarging masses in the area with good relief of symptoms. He was recently admitted with severe pelvic pain. This prompted imaging showing a large soft tissue masses in the pelvis deviating the right thumb and sigmoid colon. He is comment referred for discussion of potential localized palliative radiation to this dominant site of disease which is interfering with his quality of life. I discussed the recent regard studies with Mr. Pezzullo and examined him. I offered him a course of localized palliative conformal radiation to the pelvic masses and he would like to proceed.  NARRATIVE:  The patient was brought to the CT Simulation planning suite.  Identity was confirmed.  All relevant records and images related to the planned course of therapy were reviewed.  The patient freely provided informed written consent to proceed with treatment after reviewing the details related to the planned course of therapy. The consent form was witnessed and verified by the simulation staff.  Then, the patient was set-up in a stable reproducible  supine position for radiation therapy.  CT images were obtained.  Surface markings were placed.  The CT images were loaded into the planning software.  Then the target and avoidance structures were contoured.  Treatment planning then occurred.  The radiation prescription was entered and confirmed.  A total of 6 complex treatment devices were fabricated. These included an alpha cradle initially foreleg  positioning. Then, the patient underwent CT imaging which is above the planning software. 5 complex treatment devices were fabricated to shape multileaf collimators around the pelvic masses from 5 separate positions avoiding the patient's hip prosthesis and using a variety of angles to conform radiation dose minimize radiation exposure to the bowel and bladder. I have requested : Isodose Plan.  I have ordered:Nutrition Consult  PLAN:  The patient will receive 32 Gy in a 8 fractions starting with escalated dose induction fractions of 4 Gy for the first two, followed by consolidative 3 Gy fractions.  ________________________________  Artist Pais Kathrynn Running, M.D.

## 2011-05-13 NOTE — Telephone Encounter (Signed)
Called office of Dr. Doneen Poisson and requested inpatient consult to evaluate right thigh pain. H/O right hip replacement per Dr. Magnus Ivan. Gave Dr. Kalman Drape pager #. Lauren will alert MD>

## 2011-05-14 NOTE — Progress Notes (Signed)
Came for a followup visit with Mr. Mazariego today and he states that she does not know why his been seen by the [facility] as they are not cancer doctors and Dr. Truett Perna l is writing his orders. I went over with the patient that [facility] are internal medicine physicians and explained tour role in his care here in the hospital as his  attending physicians while the oncologists are  Consulting. He remained hostile stating that there is no need for our group to see him and that his group of Drs- the oncologists/Dr Truett Perna in managing his pain, cancer and his constipation and he does not need to be seen by [place]. Mr. Iden did not want answer any questions- for subjective such as how he is doing or otherwise. I did report to the above to Dr. Myna Hidalgo who is  on call for Dr. Truett Perna, and asked if oncology could take over as the primary for this patient given thaty he does not want [facility] involvement in his care as above, and he states that he would defer that decision to Dr. Truett Perna since he is this patient's primary oncologist.

## 2011-05-15 DIAGNOSIS — M545 Low back pain: Secondary | ICD-10-CM

## 2011-05-15 MED ORDER — VITAMINS A & D EX OINT
TOPICAL_OINTMENT | CUTANEOUS | Status: AC
  Administered 2011-05-15: 5
  Filled 2011-05-15: qty 10

## 2011-05-15 NOTE — Progress Notes (Signed)
This office note has been dictated.

## 2011-05-15 NOTE — Progress Notes (Signed)
Discussed pt with Dr Myna Hidalgo who saw pt today for oncology, and he states  No need for [facility]to see pt today. Will follow up with Dr Truett Perna  in a.m. regarding oncology taking over as patient's primary team since he does not want to be seen by any of the [facility] as per the 12/29 note.
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
## 2011-05-16 ENCOUNTER — Ambulatory Visit
Admission: RE | Admit: 2011-05-16 | Discharge: 2011-05-16 | Disposition: A | Discharge status: Home / Self Care | Payer: Medicaid Other | Source: Ambulatory Visit | Attending: Radiation Oncology | Admitting: Radiation Oncology

## 2011-05-16 DIAGNOSIS — C785 Secondary malignant neoplasm of large intestine and rectum: Secondary | ICD-10-CM

## 2011-05-16 DIAGNOSIS — C786 Secondary malignant neoplasm of retroperitoneum and peritoneum: Secondary | ICD-10-CM

## 2011-05-16 NOTE — Progress Notes (Addendum)
Initial visit with pt on referral by nursing.   Pt was somewhat guarded during visit, but receptive and thankful.  Pt reported that he is "taking it one day at a time" and does not like to speak about his illness.  Pt feels that talking about his condition won't be helpful -   He is supported by his sister in law.   Pt initially from [place], but did not elaborate on family support.    Will continue to follow while pt is on the floor with goal of addressing pt support and coping.        05/16/11 1300  Clinical Encounter Type  Visited With Patient  Visit Type Initial;Psychological support;Spiritual support;Social support  Referral From Nurse  Consult/Referral To Nurse

## 2011-05-16 NOTE — Progress Notes (Signed)
Progress Note:  Subjective: He continues to have lower abdominal discomfort related to expanding soft tissue masses from metastatic pancreatic cancer and back pain and right leg weakness due to a lumbar disc problem. Palliative radiation to the pelvis has been initiated. He feels his pain control is acceptable with when necessary Percocet and short acting morphine. He does not want to start a long-acting narcotic at this time. He he is also getting relief from when necessary Toradol.     Vitals: Filed Vitals:   05/16/11 0532  BP: 131/80  Pulse: 80  Temp: 98.6 F (37 C)  Resp: 16   Wt Readings from Last 3 Encounters:  05/11/11 152 lb 1.9 oz (69 kg)  05/04/11 153 lb 1.6 oz (69.446 kg)  04/15/11 155 lb 9.6 oz (70.58 kg)     PHYSICAL EXAM:  General normal Head: Normal Eyes: Normal Throat: Normal Neck: Full range of motion Lymph Nodes: Not examined Resp: Clear to auscultation resonant to Breasts: Not exam Cardio: Regular cardiac rhythm no murmur GI: Soft nontender no palpable mass or organomegaly Extremities: No edema no calf tenderness Vascular:  Grossly normal Neurologic focal weakness of his right lower extremity in flexion at the knee. Weakness in extension at the foot. Deep tendon reflexes absent at the knee but symmetrical left and right Skin: Normal  Labs:  No results found for this basename: WBC:2,HGB:2,HCT:2,PLT:2 in the last 72 hours No results found for this basename: NA:2,K:2,CL:2,CO2:2,GLUCOSE:2,BUN:2,CREATININE:2,CALCIUM:2 in the last 72 hours    Images Studies/Results:   No results found.   Patient Active Problem List  Diagnoses  . Pancreatic cancer, s/p whipple 02/09/2010, T2N0  . Constipation  . Pain in lower back  . Hypertension    Assessment and Plan:   1. 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 Histology and  immunohistochemical profile were consistent with a poorly-differentiated tumor with neuroendocrine and hepatoid features. The CA19-9 tumor marker was normal at 15.7 on 12/03/2010, and the CEA was normal at 1.7.  2. 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 Treated with radiation then chemo started. Staging PET scan confirmed increased FDG activity associated with the right rectus sheath mass and multiple peritoneal metastases.  a. Initiation of gemcitabine chemotherapy on the Onconova study 12/21/2010: He completed day 1 of cycle #2 on 01/18/2011.  b. Restating CT evaluation 01/31/2011 was consistent with disease progression. Initiation of FOLFOX chemotherapy with cycle #1 given on 03/09/2011. He completed cycle 2 on 03/30/2011. He completed cycle 3 on 04/20/2011 with Neulasta support. 2. Pain secondary to the rectus sheath mass and pelvic/peritoneal metastases: Improved following radiation to the right abdominal wall mass. He has been maintained on Percocet at home 3. . He now has increased pain in the lower abdomen related to progressive metastatic disease involving  Large, bilateral, 8 cm soft tissue masses in the pelvis compressing his colon. 4. .4.Chronic low back and left leg pain. Now with focal neurologic deficits of RLE. Followed by orthopedic surgery, Dr Magnus Ivan who will see him while in hospital and make recommendations. 5. Constipation. Likely multifactorial to rectum pressure by tumor in the pelvis and use of narcotic analgesics.  Patient asked that we be honest with him and let him know where he stands even if it is bad news. He is unstable to leave the hospital at this time due to significantly weakness and uncontrolled pain. Anticipate  having to complete most of his radiation while he is in the hospital. We appreciate the assistance from [facility] hospitalists, radiation oncology, and orthopedic  surgery.  Yazlin Ekblad M 05/16/2011, 9:02 AM

## 2011-05-16 NOTE — H&P (Signed)
[name] is an 60 y.o. male.   Chief Complaint: Back pain radiates to rt thigh and leg. Herniated L4-5 disc per MRI. HPI: Epidural Steroid Injection ordered by Dr Magnus Ivan  Past Medical History  Diagnosis Date  . Diarrhea   . Nausea   . Rash     on abdomen from radiation   . Chronic back pain     r/t disc disease--w/bilateral leg pain  . History of drug abuse     Remote--heroin & cocaine  . Pancreatic cancer 03/2010    poorly differentiated adenocarcinoma  . 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 Hypertension   . Osteoarthritis   . Pancreatitis     Past Surgical History  Procedure Date  . Whipple procedure 02/08/2010  . Portacath placement 01/06/11    8/15/12tip in superior cavoatrial junction  . Hernia repair     inguinal hernia repair as a child  . Dx laparoscopy 01/13/2011  . Right total hip replacement 08/14/2010    Family History  Problem Relation Age of Onset  . Cancer Mother   . Cancer Sister   . Cancer Sister    Social History:  reports that he has been smoking Cigarettes.  He has a 30 pack-year smoking history. He has never used smokeless tobacco. He reports that he drinks about 8.4 ounces of alcohol per week. He reports that he does not use illicit drugs.  Allergies:  Allergies  Allergen Reactions  . Hydromorphone Itching  . Demerol     Itching     Medications Prior to Admission  Medication Dose Route Frequency Provider Last Rate Last Dose  . 0.9 %  sodium chloride infusion   Intravenous Continuous Karyl Kinnier Stinson, DO 200 mL/hr at 05/15/11 2048    . bisacodyl (DULCOLAX) EC tablet 5 mg  5 mg Oral Once Rise Patience, PA   5 mg at 05/11/11 1712  . diphenhydrAMINE (BENADRYL) injection 12.5 mg  12.5 mg Intravenous Q6H PRN Karyl Kinnier Stinson, DO       Or  . diphenhydrAMINE (BENADRYL) 12.5 MG/5ML elixir 12.5 mg  12.5 mg Oral Q6H PRN Karyl Kinnier Stinson, DO      . enoxaparin (LOVENOX) injection 40 mg  40 mg Subcutaneous Q24H  Karyl Kinnier Stinson, DO      . iohexol (OMNIPAQUE) 300 MG/ML solution 100 mL  100 mL Intravenous Once PRN Medication Radiologist   100 mL at 05/11/11 1625  . ketorolac (TORADOL) 30 MG/ML injection 30 mg  30 mg Intravenous Once Rise Patience, PA   30 mg at 05/11/11 1816  . ketorolac (TORADOL) 30 MG/ML injection 30 mg  30 mg Intravenous Q6H PRN Iskra Magick-Myers   30 mg at 05/16/11 0904  . metoprolol tartrate (LOPRESSOR) tablet 25 mg  25 mg Oral Daily Karyl Kinnier Stinson, DO   25 mg at 05/16/11 1048  . morphine (MSIR) tablet 15 mg  15 mg Oral Q4H PRN Lucile Shutters, MD      . morphine 4 MG/ML injection 6 mg  6 mg Intravenous Once Rise Patience, PA   6 mg at 05/11/11 1215  . morphine 4 MG/ML injection 6 mg  6 mg Intravenous Once Rise Patience, PA   6 mg at 05/11/11 1354  . morphine 4 MG/ML injection 6 mg  6 mg Intravenous Once Rise Patience, PA   6 mg at 05/11/11 2023  . morphine 4 MG/ML injection 6 mg  6 mg Intravenous Once  Rise Patience, PA   6 mg at 05/11/11 1707  . morphine 4 MG/ML injection 6 mg  6 mg Intravenous Once Flint Melter, MD   6 mg at 05/11/11 2035  . morphine injection 8 mg  8 mg Intravenous Q2H PRN Pierce Crane, MD   8 mg at 05/16/11 1041  . naloxone [facility]) injection 0.4 mg  0.4 mg Intravenous PRN Karyl Kinnier Stinson, DO       And  . sodium chloride 0.9 % injection 9 mL  9 mL Intravenous PRN Karyl Kinnier Stinson, DO      . ondansetron [facility]) injection 4 mg  4 mg Intravenous Once Rise Patience, PA   4 mg at 05/11/11 1217  . ondansetron (ZOFRAN) tablet 4 mg  4 mg Oral Q6H PRN Karyl Kinnier Stinson, DO       Or  . ondansetron [facility]) injection 4 mg  4 mg Intravenous Q6H PRN Karyl Kinnier Stinson, DO      . ondansetron [facility]) injection 4 mg  4 mg Intravenous Q6H PRN Karyl Kinnier Stinson, DO      . pantoprazole (PROTONIX) EC tablet 40 mg  40 mg Oral Q1200 Gwen Her, PHARMD   40 mg at 05/15/11 1214  . polyethylene glycol (MIRALAX / GLYCOLAX) packet 17 g  17 g Oral  Daily Lucile Shutters, MD   17 g at 05/16/11 1048  . prochlorperazine (COMPAZINE) tablet 10 mg  10 mg Oral Q6H PRN Karyl Kinnier Stinson, DO      . senna [facility]) tablet 8.6 mg  1 tablet Oral BID Karyl Kinnier Stinson, DO   8.6 mg at 05/16/11 1048  . vitamin A & D ointment        5 application at 05/15/11 1009  . zolpidem (AMBIEN) tablet 10 mg  10 mg Oral QHS PRN Iskra Magick-Myers      . DISCONTD: fentaNYL 10 mcg/mL PCA injection   Intravenous Q4H Karyl Kinnier [place], DO      . DISCONTD: morphine 4 MG/ML injection 2 mg  2 mg Intravenous Q2H PRN Lucile Shutters, MD   2 mg at 05/12/11 1312  . DISCONTD: morphine 4 MG/ML injection 3 mg  3 mg Intravenous Q2H PRN Orlando Vega   3 mg at 05/12/11 1722  . DISCONTD: morphine injection 6 mg  6 mg Intravenous Q2H PRN Lucile Shutters, MD      . DISCONTD: oxyCODONE-acetaminophen (PERCOCET) 5-325 MG per tablet 2 tablet  2 tablet Oral Q6H PRN Iskra Magick-Myers   2 tablet at 05/12/11 0544  . DISCONTD: pantoprazole (PROTONIX) injection 40 mg  40 mg Intravenous Q24H Karyl Kinnier [place], DO      . DISCONTD: polyethylene glycol (MIRALAX / GLYCOLAX) packet 17 g  17 g Oral Daily PRN Brooke Pace, DO       Medications Prior to Admission  Medication Sig Dispense Refill  . lidocaine-prilocaine (EMLA) cream Apply topically as needed.        . metoprolol tartrate (LOPRESSOR) 25 MG tablet Take 25 mg by mouth daily.        Marland Kitchen oxyCODONE-acetaminophen (PERCOCET) 10-325 MG per tablet Take 1 tablet by mouth every 4 (four) hours as needed for pain (severe pain).  42 tablet  0  . polyethylene glycol (MIRALAX / GLYCOLAX) packet Take 17 g by mouth daily as needed.        . prochlorperazine (COMPAZINE) 10 MG tablet Take 10 mg by mouth every 6 (six) hours as needed.  No results found for this or any previous visit (from the past 48 hour(s)). No results found.  ROS  Blood pressure 131/80, pulse 80, temperature 98.6 F (37 C), temperature  source Oral, resp. rate 16, height 6\' 1"  (1.854 m), weight 152 lb 1.9 oz (69 kg), SpO2 92.00%. Physical Exam   Assessment/Plan L 4-5 disc herniation per MRI; back pain radiates to Rt thigh and leg. Scheduled for Epidural injection 05/18/11 in IR Pt aware of procedure benefits and risks and agreeable to proceed. Consent signed and in chart.  Babara Buffalo A 05/16/2011, 10:51 AM

## 2011-05-17 LAB — BASIC METABOLIC PANEL
BUN: 7 mg/dL (ref 6–23)
CO2: 28 mEq/L (ref 19–32)
Calcium: 8.7 mg/dL (ref 8.4–10.5)
Chloride: 98 mEq/L (ref 96–112)
Creatinine, Ser: 0.52 mg/dL (ref 0.50–1.35)
Glucose, Bld: 88 mg/dL (ref 70–99)

## 2011-05-17 MED ORDER — MAGNESIUM CITRATE PO SOLN
1.0000 | Freq: Once | ORAL | Status: AC
  Administered 2011-05-17: 1 via ORAL
  Filled 2011-05-17: qty 296

## 2011-05-17 MED ORDER — CELECOXIB 200 MG PO CAPS
200.0000 mg | ORAL_CAPSULE | Freq: Two times a day (BID) | ORAL | Status: DC
  Administered 2011-05-17 – 2011-05-20 (×7): 200 mg via ORAL
  Filled 2011-05-17 (×8): qty 1

## 2011-05-17 NOTE — Progress Notes (Signed)
Went in to see patient today and he requested not to be seen by  [facility] hospitalist as he does not want bills coming from [facility] hospitalist. He states that he only wants to be seen by Dr. Sherrill/oncologists. Team 3 rounding hospitalist to follow up with Dr. Truett Perna in the a.m. regarding taking patient on his service, please see 12/29 and 12/30 [facility] hospitalist progress notes.

## 2011-05-17 NOTE — Progress Notes (Addendum)
LATE ENRTY  Subjective Pt seen earlier today and states constipation better- +BM, no new c/o reported Less hostile today Objective: Filed Vitals:   05/15/11 2119 05/16/11 0532 05/16/11 1345 05/16/11 2040  BP: 153/91 131/80 148/82 159/90  Pulse: 83 80 79 92  Temp: 98.7 F (37.1 C) 98.6 F (37 C) 98.9 F (37.2 C) 99.4 F (37.4 C)  TempSrc: Oral Oral Oral Oral  Resp: 16 16 18 16   Height:      Weight:      SpO2: 99% 92% 97% 97%   Weight change:   Intake/Output Summary (Last 24 hours) at 05/17/11 0008 Last data filed at 05/16/11 1300  Gross per 24 hour  Intake    840 ml  Output    700 ml  Net    140 ml    General: Alert, awake, oriented x3, in no acute distress.  HEENT: neck is supple.  Heart: RRR, nl S1 s2 Lungs: clear to aucultation Abdomen: soft, +BS, nontender  Extremities: no edema, no cyanosis   Lab Results: No results found for this basename: NA:2,K:2,CL:2,CO2:2,GLUCOSE:2,BUN:2,CREATININE:2,CALCIUM:2,MG:2,PHOS:2 in the last 72 hours No results found for this basename: AST:2,ALT:2,ALKPHOS:2,BILITOT:2,PROT:2,ALBUMIN:2 in the last 72 hours No results found for this basename: LIPASE:2,AMYLASE:2 in the last 72 hours No results found for this basename: WBC:2,NEUTROABS:2,HGB:2,HCT:2,MCV:2,PLT:2 in the last 72 hours No results found for this basename: CKTOTAL:3,CKMB:3,CKMBINDEX:3,TROPONINI:3 in the last 72 hours No components found with this basename: POCBNP:3 No results found for this basename: DDIMER:2 in the last 72 hours No results found for this basename: HGBA1C:2 in the last 72 hours No results found for this basename: CHOL:2,HDL:2,LDLCALC:2,TRIG:2,CHOLHDL:2,LDLDIRECT:2 in the last 72 hours No results found for this basename: TSH,T4TOTAL,FREET3,T3FREE,THYROIDAB in the last 72 hours No results found for this basename: VITAMINB12:2,FOLATE:2,FERRITIN:2,TIBC:2,IRON:2,RETICCTPCT:2 in the last 72 hours  Micro Results: No results found for this or any previous visit  (from the past 240 hour(s)).  Studies/Results: No results found.  Medications: I have reviewed the patient's current medications.  Metastatic Pancreatic CA - per onc  Constipation:  Continue current bowel regimen  HTN:  continue metoprolol    Lower back pain:  Per onc Dr Magnus Ivan to see for for further recs   LOS: 6 days   Kela Millin M.D.  [facility] 05/17/2011, 12:08 AM

## 2011-05-17 NOTE — Progress Notes (Signed)
In better spirits. Exam stable - improved strength RLE compared with my 12/31 exam.  DTR absent at R knee; 1+ left. Abdomen soft, non-tender. Lungs clear. No new lab. Impression: #1. Metastatic pancreatic cancer. Recent further progression to involve bilateral large pelvic soft tissue masses causing local compression on the colon. Palliative radiation in progress.  #2. A herniated lumbar disc. Right lower extremity weakness and absent reflex secondary to this. Epidural steroid injection plan for 05/18/11.  #3. Pain control. Multifactorial pain related to cancer and herniated lumbar disc. He reports good relief with Toradol but we have given maximum doses at this point. I'm going to add Celebrex. I discussed this with him and he is in agreement. He still wants to hold off on long-acting narcotic analgesics.  #4. Constipation. Combined effects of narcotic analgesics and direct compression by pelvic tumor. He is already on a laxative and stool softener but getting only minor results. He has tenesmus likely due to the compressive effects of the pelvic tumor. I am going to try some magnesium citrate.

## 2011-05-18 ENCOUNTER — Inpatient Hospital Stay (HOSPITAL_COMMUNITY): Payer: Medicaid Other

## 2011-05-18 ENCOUNTER — Ambulatory Visit
Admission: RE | Admit: 2011-05-18 | Discharge: 2011-05-18 | Disposition: A | Discharge status: Home / Self Care | Payer: Medicaid Other | Source: Ambulatory Visit | Attending: Radiation Oncology | Admitting: Radiation Oncology

## 2011-05-18 MED ORDER — IOHEXOL 180 MG/ML  SOLN
10.0000 mL | Freq: Once | INTRAMUSCULAR | Status: AC | PRN
  Administered 2011-05-18: 10 mL via INTRATHECAL

## 2011-05-18 NOTE — Progress Notes (Signed)
No acute change. Exam stable. Motor strength right lower extremity improved compared with my exam earlier in the week. Impression: #1. Metastatic pancreatic cancer. He resumes palliative radiation to 2 large bilateral pelvic soft tissue masses today. #2. Herniated lumbar spinal disc. #3. Right lower extremity weakness and absent reflex at the knee secondary to #2. For epidural steroid injection today. #4. Multifactorial pain cancer related and component from herniated lumbar disc. Celebrex added to his regimen. No new recommendations at this time. Thank you.

## 2011-05-18 NOTE — Progress Notes (Signed)
Please se Dr. Blain Pais note yesterday. Patient would like to be seen only by his oncologists and not by hospitalists. I tried calling Dr. Truett Perna (patient's primary oncologist) but he is on vacation. I then spoke to Dr. Cyndie Chime who agreed to take over care. The above has been communicated to the patient.  Keshonna Valvo 05/18/2011 1:50 PM

## 2011-05-19 ENCOUNTER — Ambulatory Visit
Admission: RE | Admit: 2011-05-19 | Discharge: 2011-05-19 | Disposition: A | Discharge status: Home / Self Care | Payer: Medicaid Other | Source: Ambulatory Visit | Attending: Radiation Oncology | Admitting: Radiation Oncology

## 2011-05-19 ENCOUNTER — Encounter (HOSPITAL_COMMUNITY): Payer: Self-pay | Admitting: Oncology

## 2011-05-19 DIAGNOSIS — C259 Malignant neoplasm of pancreas, unspecified: Secondary | ICD-10-CM

## 2011-05-19 DIAGNOSIS — M5106 Intervertebral disc disorders with myelopathy, lumbar region: Secondary | ICD-10-CM

## 2011-05-19 DIAGNOSIS — M6281 Muscle weakness (generalized): Secondary | ICD-10-CM

## 2011-05-19 HISTORY — DX: Intervertebral disc disorders with myelopathy, lumbar region: M51.06

## 2011-05-19 NOTE — Progress Notes (Signed)
Pt declined radiation therapy book, went over post sim teaching, pt stated he had already had post sim teaching before, he declined sitz bath offered for use prn future if needed, "i don't want to take anything else with me,being d/c hospital today or tomorrow, Denys pain, some fatigue, informed pt will see Md weekly/and prn, asked for aquaphor cream fro dry skin, gave several samples to pt,. Pt in w/c, called [facility] to transport pt back to his hospital room 1:42 PM

## 2011-05-19 NOTE — Progress Notes (Signed)
Progress Note:  Subjective: He had an epidural steroid injection yesterday with the assistance of interventional radiology. He tolerated the procedure well. He is already experiencing less pain in his right lower extremity and back. He has started to ambulate. Palliative radiation therapy to the soft tissue masses in his pelvis was resumed yesterday. He did get a good result with the addition of magnesium citrate to his laxative regimen.  Fluids that were started at 200 mL per hour on admission due to nausea vomiting and constipation were never decreased. In view of resolution of these problems I will decrease the volume of fluid is at this time.    Vitals: Filed Vitals:   05/19/11 0617  BP: 138/81  Pulse: 96  Temp: 98 F (36.7 C)  Resp: 16   Wt Readings from Last 3 Encounters:  05/11/11 152 lb 1.9 oz (69 kg)  05/04/11 153 lb 1.6 oz (69.446 kg)  04/15/11 155 lb 9.6 oz (70.58 kg)     PHYSICAL EXAM:  General in much better spirits Head: Normal Eyes: Normal Throat: Not examined Neck: Full range of motion Lymph Nodes: Not examined Resp: Clear to auscultation resonant to percussion Breasts: Not examined Cardio: Regular rhythm no murmur GI: Soft nontender normal bowel sounds no palpable mass or organomegaly Extremities: No edema no calf tenderness Vascular:  No cyanosis Neurologic motor strength is 4+ over 5 right lower extremity 5 over 5 left lower extremity reflex remains absent at the right knee Skin: Normal  Labs:  No results found for this basename: WBC:2,HGB:2,HCT:2,PLT:2 in the last 72 hours  Basename 05/17/11 0445  NA 132*  K 3.9  CL 98  CO2 28  GLUCOSE 88  BUN 7  CREATININE 0.52  CALCIUM 8.7      Images Studies/Results:   No results found.   Patient Active Problem List  Diagnoses  . Pancreatic cancer, s/p whipple 02/09/2010, T2N0  . Constipation  . Pain in lower back  . Hypertension    Assessment and Plan:  #1. Metastatic pancreatic  cancer Initial progression to involve the anterior rectus muscle treated with radiation. Recent progression to involve large soft tissue masses in the pelvis bilaterally. He is back on a program of palliative radiation. He was receiving FOLFOX chemotherapy. I will defer to his primary oncologist whether or not this is resumed when he completes planned radiation course.  #2. Herniated lumbar disc. Pain and focal weakness right lower extremity due to this. Now status post epidural steroid injection and symptoms already improving. Motor strength is stable on exam this week.  #3. Pain control. Multifactorial from cancer and from herniated disc. Overall improvement with epidural steroid injection and addition of Celebrex to his regimen.  #4. Constipation. Multifactorial. Direct compression from pelvic tumor and use of narcotic analgesics. Improved with the primary treatment of his cancer and use of laxatives.  #5. Past history of IV drug abuse. If anything he has minimized his use of narcotics during this admission.  Disposition: If he continues to make progress with respect to pain and ability to ambulate he will be discharged home to complete radiation as an outpatient.    [name] M 05/19/2011, 9:18 AM

## 2011-05-20 ENCOUNTER — Ambulatory Visit
Admission: RE | Admit: 2011-05-20 | Discharge: 2011-05-20 | Disposition: A | Discharge status: Home / Self Care | Payer: Medicaid Other | Source: Ambulatory Visit | Attending: Radiation Oncology | Admitting: Radiation Oncology

## 2011-05-20 ENCOUNTER — Encounter: Payer: Self-pay | Admitting: Radiation Oncology

## 2011-05-20 ENCOUNTER — Other Ambulatory Visit: Payer: Self-pay | Admitting: *Deleted

## 2011-05-20 DIAGNOSIS — M5126 Other intervertebral disc displacement, lumbar region: Secondary | ICD-10-CM

## 2011-05-20 DIAGNOSIS — C259 Malignant neoplasm of pancreas, unspecified: Secondary | ICD-10-CM

## 2011-05-20 DIAGNOSIS — G893 Neoplasm related pain (acute) (chronic): Secondary | ICD-10-CM

## 2011-05-20 MED ORDER — ZOLPIDEM TARTRATE 10 MG PO TABS: 10.0000 mg | ORAL_TABLET | Freq: Every evening | ORAL | Status: AC | PRN

## 2011-05-20 MED ORDER — CELECOXIB 200 MG PO CAPS: 200.0000 mg | ORAL_CAPSULE | Freq: Two times a day (BID) | ORAL | Status: AC

## 2011-05-20 MED ORDER — HEPARIN SOD (PORK) LOCK FLUSH 100 UNIT/ML IV SOLN
INTRAVENOUS | Status: AC
  Administered 2011-05-20: 500 [IU]
  Filled 2011-05-20: qty 5

## 2011-05-20 MED ORDER — OXYCODONE-ACETAMINOPHEN 10-325 MG PO TABS: 1.0000 | ORAL_TABLET | ORAL | Status: DC | PRN

## 2011-05-20 MED ORDER — MAGNESIUM CITRATE PO SOLN: 296.0000 mL | Freq: Two times a day (BID) | ORAL | Status: AC | PRN

## 2011-05-20 NOTE — Progress Notes (Signed)
  Radiation Oncology         [phone] ________________________________  Name: [name] MRN: [id number]  Date: 05/20/2011  DOB: 04-02-1951  Inpatient Weekly Radiation Therapy Management  Current Dose: 17 Gy     Planned Dose:  32 Gy  Narrative . . . . . . . . The patient presents for routine under treatment assessment.                                                   Your hospitalized. You should 5/10 fractions of his radiation treatment so far. He continues to suffer with severe pelvic pain. He describes 8/10 pain which does respond pain medication. He does note that he is having better success with bowel movements. He denies any bowel or bladder complaints related radiation. Denies any nausea.                                 Set-up films were reviewed.                                 The chart was checked. Physical Findings. . . Weight essentially stable.  No significant changes. Impression . . . . . . . The patient is  tolerating radiation. Plan . . . . . . . . . . . . Continue treatment as planned. The patient will be discharged from the hospital today. We will continue treatment as an outpatient.  ________________________________  Artist Pais. Kathrynn Running, M.D.

## 2011-05-20 NOTE — Progress Notes (Signed)
Pt brought via w/c to room 3, pt flat affect, pain in stomach, "*sharp pain 8/10", last pain med 8mg  morphine given at 8am, can have q 4h prn, pt 9:48 AM

## 2011-05-20 NOTE — Progress Notes (Addendum)
Patient discharged home, all discharge medications and instructions reviewed and questions answered. Pt to go home by bus, bus pass provided.  Pt refused wheelchair assistance.

## 2011-05-21 NOTE — Discharge Summary (Signed)
Physician Discharge Summary  Patient ID: [name] MRN: [id number] DOB/AGE: 1951/05/09 61 y.o.  Admit date: 05/11/2011 Discharge date: 05/20/2011  Admission Diagnoses: #1 nausea and vomiting #2 lower abdominal pain #3 low back pain radiating down the right leg with right leg weakness due to herniated L4-5 disc #4 progressive metastatic cancer of the pancreas  Hospital Course:  The patient is a 61 year old man admitted by the hospitalist service for further evaluation of back and right leg pain with findings on MRI of the spine done on 05/06/11 showing a circumferential bulging of a intervertebral disc right more than left at L4-5 with the right posterolateral and foraminal disc herniation which had progressed slightly since a prior study done in July. He was initially admitted for pain control. Due to known metastatic pancreatic cancer oncology service was asked to evaluate the patient and co- managed his care with the hospitalists. Repeat evaluation of his cancer during this admission showed enlarging bilateral pelvic soft tissue masses 8 x 5 cm on the left compared with 4 x 3 previously and 8 x 4 on the right compared with 5 x 3 on prior studies done 01/1711. The mass is displaced the distal sigmoid colon and rectosigmoid colon posteriorly and superiorly. There appeared to have been a mixed response to recent FOLFOX chemotherapy since many lesions in the liver and anterior upper abdominal wall had decreased in size in the interval.   #1 herniated disc: Patient has been under care of  Dr. Loretha Brasil orthopedic surgery. In view of his advanced cancer he is not considered a reasonable surgical candidate Dr. Rayburn Ma felt that a epidural steroid injection would significantly reduce his pain. This was arranged and performed by interventional radiology On 05/21/11 without complication. The patient had immediate relief and pain reduction and was ambulating much better by the time of discharge.  On my initial exam earlier in the week, he had focal weakness of his right lower extremity in flexion at the knee and extension of foot with absent deep tendon reflex at the knee but symmetrically absent reflex at the left knee. Over the course of the week at bed rest and with pain medication he had improvement in his motor strength even before he had the epidural steroid injection. On my exam at time of discharge he had 5 over 5 strength in the right leg when tested in bed. He will followup with Dr. Rayburn Ma after discharge.  #2. Metastatic pancreatic cancer. Cancer initially diagnosed in September 2011 treated with radical surgery. Recurrence in July 2012 involving me right rectus sheath muscle treated with radiation. Multiple peritoneal metastases noted at that time as well. He began chemotherapy with Gemzar in August of restaging CT on 01/31/11 showed disease progression and chemotherapy change to FOLFOX beginning 03/09/11. He had 3 cycles through 04/20/11 but unfortunately CT scan done this admission showed further progression in the pelvis with a mixed response in other areas. Radiation oncology was asked to consult on the patient again. He was started on a course of palliative radiation to the pelvic soft tissue masses. This will be continued as an outpatient.  #3. Nausea, vomiting, constipation Multifactorial due to pain, narcotic analgesics, and direct compressive effect of the soft tissue masses in the pelvis on his colon. He did have improvement in his bowel function with the use of multiple laxatives. Magnesium citrate seemed to work better than MiraLAX and Colace.  #4 pain control. Multifactorial etiologies of this pain including herniated lumbar disc and pelvic tumor. Despite  his prior history of narcotic drug abuse, he was very careful about the type and quantity of pain medication that he used during this admission. In fact, he declined an offer to be started on a long-acting narcotic. He  worked mostly with when necessary doses of IV morphine and oral Percocet. He did seem to get significant relief with Toradol. Given this fact, he agreed to a trial of Celebrex which I started during this admission 200 mg twice daily.  #5. Essential hypertension. This was not a problem during this admission.   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  Constipation combined effects of narcotic analgesics, and direct compression by a pelvic tumor on hiscolon  Pain in lower back with associated sensorimotor neuropathy right lower extremity due to herniated disc  Hypertension  Disposition: Condition stable at time of discharge. He is able to ambulate but still has some residual pain and weakness of his right lower extremity. He will followup with Dr. Rayburn Ma orthopedic surgery for his spine problems. He will continue radiation therapy as an outpatient to palliate rapidly growing bilateral soft tissue masses in the pelvis. He will followup with Dr. Mancel Bale for medical oncology management. Activity as tolerated. Regular diet. Continue current laxative regimen with the addition of when necessary magnesium citrate. Continue current pain regimen Percocet 10/325 1 every 4 hours when necessary with the addition of Celebrex 200 mg by mouth twice a day Continue metoprolol 25 mg daily  for hypertension; Ambien 10 mg at bedtime when necessary sleep; Compazine 10 mg by mouth Q6 hours when necessary nausea or vomiting; MiraLAX 17 g daily when necessary constipation;topical lidocaine cream prior to access a Port-A-Cath infusion device.         SignedLevert Feinstein 05/21/2011, 3:16 PM

## 2011-05-23 ENCOUNTER — Ambulatory Visit
Admission: RE | Admit: 2011-05-23 | Discharge: 2011-05-23 | Disposition: A | Discharge status: Home / Self Care | Payer: Medicaid Other | Source: Ambulatory Visit | Attending: Radiation Oncology | Admitting: Radiation Oncology

## 2011-05-24 ENCOUNTER — Ambulatory Visit
Admission: RE | Admit: 2011-05-24 | Discharge: 2011-05-24 | Disposition: A | Discharge status: Home / Self Care | Payer: Medicaid Other | Source: Ambulatory Visit | Attending: Radiation Oncology | Admitting: Radiation Oncology

## 2011-05-25 ENCOUNTER — Ambulatory Visit
Admission: RE | Admit: 2011-05-25 | Discharge: 2011-05-25 | Disposition: A | Discharge status: Home / Self Care | Payer: Medicaid Other | Source: Ambulatory Visit | Attending: Radiation Oncology | Admitting: Radiation Oncology

## 2011-05-25 ENCOUNTER — Inpatient Hospital Stay: Payer: Medicaid Other

## 2011-05-25 ENCOUNTER — Telehealth: Payer: Self-pay | Admitting: Oncology

## 2011-05-25 ENCOUNTER — Ambulatory Visit (HOSPITAL_BASED_OUTPATIENT_CLINIC_OR_DEPARTMENT_OTHER): Payer: Medicaid Other | Admitting: Nurse Practitioner

## 2011-05-25 ENCOUNTER — Other Ambulatory Visit (HOSPITAL_BASED_OUTPATIENT_CLINIC_OR_DEPARTMENT_OTHER): Payer: Medicaid Other

## 2011-05-25 VITALS — BP 121/81 | HR 73 | Temp 97.1°F | Ht 73.0 in | Wt 149.8 lb

## 2011-05-25 DIAGNOSIS — G893 Neoplasm related pain (acute) (chronic): Secondary | ICD-10-CM

## 2011-05-25 DIAGNOSIS — C259 Malignant neoplasm of pancreas, unspecified: Secondary | ICD-10-CM

## 2011-05-25 LAB — CBC WITH DIFFERENTIAL/PLATELET
BASO%: 0 % (ref 0.0–2.0)
HCT: 37.2 % — ABNORMAL LOW (ref 38.4–49.9)
LYMPH%: 3.9 % — ABNORMAL LOW (ref 14.0–49.0)
MCHC: 32.8 g/dL (ref 32.0–36.0)
MCV: 85.7 fL (ref 79.3–98.0)
MONO#: 0.2 10*3/uL (ref 0.1–0.9)
MONO%: 1.9 % (ref 0.0–14.0)
NEUT%: 93.9 % — ABNORMAL HIGH (ref 39.0–75.0)
Platelets: 351 10*3/uL (ref 140–400)
RBC: 4.35 10*6/uL (ref 4.20–5.82)
WBC: 8.8 10*3/uL (ref 4.0–10.3)

## 2011-05-25 LAB — COMPREHENSIVE METABOLIC PANEL
ALT: 26 U/L (ref 0–53)
Alkaline Phosphatase: 164 U/L — ABNORMAL HIGH (ref 39–117)
CO2: 26 mEq/L (ref 19–32)
Creatinine, Ser: 0.58 mg/dL (ref 0.50–1.35)
Glucose, Bld: 126 mg/dL — ABNORMAL HIGH (ref 70–99)
Total Bilirubin: 0.1 mg/dL — ABNORMAL LOW (ref 0.3–1.2)

## 2011-05-25 NOTE — Progress Notes (Signed)
OFFICE PROGRESS NOTE  Interval history:  [name] is a 61 year old man with metastatic pancreas cancer. He completed cycle 3 FOLFOX beginning 04/20/2011. Cycle 4 FOLFOX was held on 05/04/2011 due to acute onset of back and leg pain. MRI of the lumbar spine 05/06/2011 showed non-compressive disc bulge at L2-3; circumferential bulging of the disc focally prominent in the foraminal to extra foraminal regions right more than left L3-4 (similar appearance to the previous study); right posterior lateral and foraminal disc herniation at L4-5, fragment in the right lateral recess and intervertebral foramen would be expected to compress the right L4 and possibly the L5 nerve roots; noncompressive shallow disc protrusion at L5-S1. A pelvic mass appeared to be enlarging. There was no discernible osseous or intraspinal metastatic disease.  [name] was hospitalized on 05/11/2011 with back and right leg pain. Abdominal CT showed multiple lesions around the liver in the anterior upper abdominal wall had decreased in size. Central pelvic lesions however had progressed substantially and were generating prominent mass effect on the rectosigmoid junction.  For the back pain [name] was followed by Dr. Rayburn Ma. An epidural steroid injection was performed on 05/21/2011 with improvement in the pain.  For progression of the pelvic masses radiation oncology was consulted and a course of palliative radiation was initiated.  He was discharged home on 05/20/2011.  [name] reports the back pain continues to be improved. He continues to have pain in the region of the right thigh. The pelvic pain is "a little better". He takes Percocet as needed. He is ambulating with a walker. Bowels moving regularly. He is taking MiraLAX.     Objective: Blood pressure 121/81, pulse 73, temperature 97.1 F (36.2 C), temperature source Oral, height 6\' 1"  (1.854 m), weight 149 lb 12.8 oz (67.949 kg).  Oropharynx is  without thrush or ulceration. Lungs are clear. Regular cardiac rhythm. Port-A-Cath site is without erythema. Abdomen is soft. Mass at the right mid abdominal wall continues to be softer, smaller. He is mildly tender over the pelvic region. Bowel sounds are active. Extremities are without edema. Question slight weakness with dorsiflexion of the right foot. Left knee DTR 2+. Question mild depression of right knee DTR.   Lab Results: Lab Results  Component Value Date   WBC 8.8 05/25/2011   HGB 12.2* 05/25/2011   HCT 37.2* 05/25/2011   MCV 85.7 05/25/2011   PLT 351 05/25/2011    Chemistry:    Chemistry      Component Value Date/Time   NA 132* 05/17/2011 0445   K 3.9 05/17/2011 0445   CL 98 05/17/2011 0445   CO2 28 05/17/2011 0445   BUN 7 05/17/2011 0445   CREATININE 0.52 05/17/2011 0445      Component Value Date/Time   CALCIUM 8.7 05/17/2011 0445   ALKPHOS 178* 05/11/2011 1335   AST 127* 05/11/2011 1335   ALT 35 05/11/2011 1335   BILITOT 0.3 05/11/2011 1335       Studies/Results: Dg Hip Complete Right  05/13/2011  *RADIOLOGY REPORT*  Clinical Data: Progressive right thigh pain status post total hip arthroplasty approximately 9 months ago.  History of pancreatic cancer.  RIGHT HIP - COMPLETE 2+ VIEW  Comparison: Pelvic CT 05/11/2011 and PET CT 12/08/2010.  Findings: Patient is status post right total hip arthroplasty. There is mild heterotopic ossification lateral to the hip joint. No acute fracture, dislocation or prosthetic loosening is identified.  There is no evidence of lytic or blastic lesion. There are mild degenerative changes  at the left hip and symphysis pubis.  IMPRESSION: No acute osseous findings or evidence of osseous metastatic disease.  The progressive intrapelvic tumor demonstrated on CT could contribute to the patient's symptoms.  Original Report Authenticated By: Gerrianne Scale, M.D.   Dg Femur Right  05/13/2011  *RADIOLOGY REPORT*  Clinical Data: Progressive right thigh pain status  post total hip arthroplasty approximately 9 months ago.  History of pancreatic cancer.  RIGHT FEMUR - 2 VIEW  Comparison: Pelvic CT 05/11/2011 and PET CT 12/08/2010.  Findings: The proximal femur is included on the hip radiographs done concurrently.  The distal femur appears normal without evidence of fracture or metastatic disease.  The distal end of the femoral prosthesis is partially imaged on these views.  IMPRESSION: No distal right femoral abnormality.  Original Report Authenticated By: Gerrianne Scale, M.D.   Mr Lumbar Spine W Wo Contrast  05/06/2011  *RADIOLOGY REPORT*  Clinical Data: Pancreatic cancer.  Back and leg pain on the right.  MRI LUMBAR SPINE WITHOUT AND WITH CONTRAST  Technique:  Multiplanar and multiecho pulse sequences of the lumbar spine were obtained without and with intravenous contrast.  Contrast:  15 ml Multihance  Comparison: CT abdomen 01/31/2011.  MRI lumbar spine 11/15/2010.  Findings: There is no abnormality at T12-L1 or L1-2.  The distal cord and conus are normal with conus tip at T12-L1.  L2-3:  Circumferential bulging of the disc.  No compressive stenosis.  L3-4:  Degeneration of the disc with loss of height. Circumferential bulging, focally prominent in the foraminal to extra foraminal regions right more than left.  There is reactive discogenic edema within the endplates of L3 and L4, which could be associated with back pain.  L4-5:  Degeneration of the disc with circumferential protrusion more pronounced in the right posterolateral direction.  Focal herniation of disc material into the right lateral recess and intervertebral foramen on the right could compress the right L4 and L5 nerve roots.  L5-S1:  Shallow protrusion of the disc.  Mild facet hypertrophy. No compressive stenosis.  Previously seen liver lesions again noted.  I do not see evidence of osseous metastatic disease.  Enlarging pelvic mass is also visible.  IMPRESSION: L2-3:  Non compressive disc bulge.  L3-4:   Circumferential bulging of the disc focally prominent in the foraminal to extra foraminal regions right more than left.  Similar appearance to the previous study.  Discogenic edema could relate to back pain.  L4-5:  Right posterolateral and foraminal disc herniation. Fragment in the right lateral recess and intervertebral foramen would be expected to compress the right L4 and possibly the L5 nerve roots.  This disease has worsened slightly since July.  L5-S1:  Non compressive shallow disc protrusion.  Soft tissue metastatic disease previously evaluated by CT.  The pelvic mass appears to be enlarging.  No discernible osseous or intraspinal metastatic disease.  Original Report Authenticated By: Thomasenia Sales, M.D.   Ct Abdomen Pelvis W Contrast  05/11/2011  *RADIOLOGY REPORT*  Clinical Data: Abdominal pain and constipation.  Nausea vomiting. History of pancreatic cancer.  CT ABDOMEN AND PELVIS WITH CONTRAST  Technique:  Multidetector CT imaging of the abdomen and pelvis was performed following the standard protocol during bolus administration of intravenous contrast.  Contrast: OMNIPAQUE IOHEXOL 300 MG/ML IV SOLN  Comparison: 01/31/2011  Findings: Interval decrease in the metastatic lesion along the right liver, measuring 1.5 x 1.1 cm today compared 1.9 x 4.6 cm previously.  There is a  tiny amount of fluid adjacent to the liver. The spleen is unremarkable.  The stomach shows evidence of surgery with gastrojejunostomy.  Biliary enteric anastomosis is again noted.  1.8 cm soft tissue lesion just inferior to the liver on the previous study has decreased to 1.4 cm.  There used and the enhancing mass in the right paramidline anterior abdominal wall has decreased from 5.1 x 4.6 cm previously to 4.2 x 3.0 cm currently.  The pancreatic head and body are surgically absent.  The adrenal glands are normal.  The kidneys are normal.  There is some interloop mesenteric fluid with diffuse mesenteric edema, as before.  No  evidence for bowel obstruction.  Imaging through the pelvis shows marked progression of the to pelvic masses noted previously.  The one on the left measures 8.0 x 5.1 cm today compared to 4.3 x 3.1 cm previously.  The one on the right measures th 8.2 x 4.2 cm today compared to 4.7 x 3.2 cm previously.  These mass lesion is displaced the distal sigmoid colon and rectosigmoid junction posteriorly and superiorly.  The patient is status post right hip replacement.  No worrisome lytic or sclerotic osseous abnormality.  IMPRESSION: Mixed interval response.  Multiple lesions around the liver and in the anterior upper abdominal wall has decreased in size in the interval.  To central pelvic lesions, however, have progressed substantially in the interval and are not generating prominent mass effect on the rectosigmoid junction.  There is some prominent stool along the course of the colon, but the pelvic lesions do not appear to be overtly obstructing the colon at this time.  Original Report Authenticated By: ERIC A. MANSELL, M.D.   Dg Abd Acute W/chest  05/11/2011  *RADIOLOGY REPORT*  Clinical Data: Abdominal pain, nausea, vomiting, no bowel movement, history pancreatic cancer post chemotherapy, hypertension, shortness of breath, smoker  ACUTE ABDOMEN SERIES (ABDOMEN 2 VIEW & CHEST 1 VIEW)  Comparison: 11/17/2009  Findings: Right side Port-A-Cath, tip projecting over SVC. Normal heart size, mediastinal contours, and pulmonary vascularity. Mild emphysematous and chronic bronchitic changes. No pulmonary infiltrate, pleural effusion, or pneumothorax. Nonobstructive bowel gas pattern. Stool throughout transverse and proximal descending colon. No bowel dilatation, bowel wall thickening or free intraperitoneal air. Scattered degenerative disc disease changes lumbar spine. Right hip prosthesis. No acute urinary tract calcification. Staple lines noted in the left mid abdomen from prior bowel surgery.  IMPRESSION: Emphysematous  and chronic bronchitic changes. Nonobstructive bowel gas pattern.  Original Report Authenticated By: Lollie Marrow, M.D.    Medications: I have reviewed the patient's current medications.  Assessment/Plan:  1. 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 Histology and immunohistochemical profile were consistent with a poorly-differentiated tumor with neuroendocrine and hepatoid features. The CA19-9 tumor marker was normal at 15.7 on 12/03/2010, and the CEA was normal at 1.7.  a. 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 b. Staging PET scan confirmed increased FDG activity associated with the right rectus sheath mass and multiple peritoneal metastases.  c. Initiation of gemcitabine chemotherapy on the [place] study 12/21/2010: He completed day 1 of cycle #2 on 01/18/2011.  d. Restating CT evaluation 01/31/2011 was consistent with disease progression.  e. Initiation of FOLFOX chemotherapy with cycle #1 given on 03/09/2011. He completed cycle 2 on 03/30/2011. He completed cycle 3 on 04/20/2011 with Neulasta support. f. Pelvic CT 05/11/2011 showed  multiple lesions around the liver and in the anterior upper abdominal wall had decreased in size; central pelvic lesions had progressed substantially in the interval and were generating prominent mass effect on the rectosigmoid junction. 2. Pain secondary to the rectus sheath mass and pelvic/peritoneal metastases. a. He completed palliative radiation to the abdominal rectus sheath mass on 02/21/2011. b. He is completing a course of palliative radiation to the pelvic masses. 3. 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 4. Remote history of heroin/cocaine use. 5. Ongoing tobacco use. 6. Port-A-Cath placement 01/06/2011. 7. History of neutropenia secondary to  chemotherapy: He received Neulasta support after day 15/cycle #1 gemcitabine chemotherapy. Neulasta was added beginning with cycle 3 FOLFOX. 8. 1-1/2-2 week history of low back pain radiating to the right leg when here 05/04/2011. MRI of the lumbar spine 05/06/2011 with findings as noted above. Dr. Rayburn Ma fell the source of pain may be due to the large disc herniation at the right side at L4/L5. An epidural steroid injection was done 05/21/2011. He has noted improvement in the back pain.  Disposition-[name] pain appears improved. He will complete the course of palliative radiation to the pelvic masses 05/27/2011. Dr. Truett Perna recommends discontinuation of further FOLFOX chemotherapy. Dr. Truett Perna discussed salvage therapy with Xeloda versus a referral to [facility] for a second opinion/clinical trial availability versus a supportive care approach. [name] is interested in evaluation at [facility]. We have made a referral to Dr. Flora Lipps or colleague. [name] will return for a followup visit with Dr. Truett Perna the week of 06/06/2011. He will contact the office the interim with any problems.  Patient seen with Dr. Truett Perna.    Lonna Cobb ANP/GNP-BC

## 2011-05-25 NOTE — Telephone Encounter (Signed)
Pt to pick up his appt calendar tomorrow when he comes in for rad. Tiffany is aware of the referral for the md at [facility]

## 2011-05-26 ENCOUNTER — Telehealth: Payer: Self-pay | Admitting: Oncology

## 2011-05-26 ENCOUNTER — Ambulatory Visit: Payer: Medicaid Other

## 2011-05-26 ENCOUNTER — Other Ambulatory Visit: Payer: Self-pay | Admitting: *Deleted

## 2011-05-26 DIAGNOSIS — C259 Malignant neoplasm of pancreas, unspecified: Secondary | ICD-10-CM

## 2011-05-26 MED ORDER — METOPROLOL TARTRATE 25 MG PO TABS: 25.0000 mg | ORAL_TABLET | Freq: Every day | ORAL | Status: DC

## 2011-05-26 MED ORDER — OXYCODONE-ACETAMINOPHEN 10-325 MG PO TABS: 1.0000 | ORAL_TABLET | ORAL | Status: DC | PRN

## 2011-05-26 NOTE — Telephone Encounter (Signed)
Pt is aware to pick up his jan 2013 appt calendar and he is aware he will be contacted with the appt at [facility]

## 2011-05-27 ENCOUNTER — Encounter: Payer: Medicaid Other | Admitting: Radiation Oncology

## 2011-05-27 ENCOUNTER — Ambulatory Visit
Admission: RE | Admit: 2011-05-27 | Discharge: 2011-05-27 | Disposition: A | Discharge status: Home / Self Care | Payer: Medicaid Other | Source: Ambulatory Visit | Attending: Radiation Oncology | Admitting: Radiation Oncology

## 2011-05-27 ENCOUNTER — Encounter: Payer: Self-pay | Admitting: Radiation Oncology

## 2011-05-27 VITALS — Wt 149.5 lb

## 2011-05-27 DIAGNOSIS — C259 Malignant neoplasm of pancreas, unspecified: Secondary | ICD-10-CM

## 2011-05-27 NOTE — Progress Notes (Signed)
Pt missed yesterday stated he was sick and had diarrhea all day till about 5pm, asked if he took any imodium"no, I just wanted it to come all out get my system cleaned out, none this am", but when pt wiped himself with tissue some blood tinge there, pt requesting percocet refill but only wants #60 tabs, not 120 ", pt pain this today managebale stated"i took my percocet this am", pt has walker at his side, , slow steady gait, 1 more treatment Monday, gave 1 month f/u appt card 11:22 AM

## 2011-05-27 NOTE — Progress Notes (Signed)
   Weekly Outpatient Treatment Management Note Current Dose:   2900cGy  Projected Dose:  3200cGy   Narrative:  The patient presents for routine under treatment assessment.  CBCT/MVCT images/Port film x-rays were reviewed.  The chart was checked. He has been having diarrhea but has not been taking Imodium for this. He will start taking it today. He had a little bit of blood on his toilet tissue when he wiped himself today. He requests a prescription for pain meds today.*  Physical Findings: Weight: 149 lb 8 oz (67.813 kg). He is in no acute distress. He uses a walker for ambulation  Impression:  The patient is tolerating radiotherapy.  Plan:  Continue radiotherapy as planned. He will followup with Dr. Kathrynn Running in one month.  *The patient stated. "I need a prescription for pain medication."  I stated, "I would use the Percocet that was prescribed to you yesterday by medical oncology."  The patient demonstrated understanding of this.

## 2011-05-30 ENCOUNTER — Ambulatory Visit: Payer: Medicaid Other

## 2011-05-30 ENCOUNTER — Telehealth: Payer: Self-pay | Admitting: *Deleted

## 2011-05-30 NOTE — Telephone Encounter (Signed)
Patient had called research nurse reporting bowel problems--diarrhea and rectal bleeding. Called patient back and he reports persistent diarrhea, however not able to verbalize how many per day and does not know how many Imodium he is taking per day. Only bought one pack of Imodium and still has a few left. Made him aware that he needs to take 8/day and let us know if this does not control the diarrhea. Explained the diarrhea is from the GI irritation from the radiation therapy and the only way he will get better is with time, medication, and to push po fluids. Expressed frustration with nurse saying "Darl Pikes, you're pissing me off. I don't want to talk to you anymore". Says he may not be well enough to make the [facility] appointment on 06/02/11 with Dr. Victoriano Lain. Strongly encouraged him to keep this appointment since appointment as well as transportation arrangements have been made for him. Explained if he did what he should to help himself, he can get his diarrhea under control by that time. Stressed that we can only help him if he does his part to help himself at home and he must take his meds as prescribed before we know what else needs to be done for him.

## 2011-05-31 ENCOUNTER — Ambulatory Visit: Payer: Medicaid Other

## 2011-06-01 ENCOUNTER — Ambulatory Visit
Admission: RE | Admit: 2011-06-01 | Discharge: 2011-06-01 | Disposition: A | Discharge status: Home / Self Care | Payer: Medicaid Other | Source: Ambulatory Visit | Attending: Radiation Oncology | Admitting: Radiation Oncology

## 2011-06-01 ENCOUNTER — Other Ambulatory Visit: Payer: Self-pay | Admitting: *Deleted

## 2011-06-01 DIAGNOSIS — C259 Malignant neoplasm of pancreas, unspecified: Secondary | ICD-10-CM

## 2011-06-01 MED ORDER — OXYCODONE-ACETAMINOPHEN 10-325 MG PO TABS: 1.0000 | ORAL_TABLET | ORAL | Status: DC | PRN

## 2011-06-01 NOTE — Telephone Encounter (Signed)
Patient called for refill on Percocet. Will pick up this am when in building for radiation appointment.

## 2011-06-03 ENCOUNTER — Telehealth: Payer: Self-pay | Admitting: Oncology

## 2011-06-03 ENCOUNTER — Ambulatory Visit (HOSPITAL_BASED_OUTPATIENT_CLINIC_OR_DEPARTMENT_OTHER): Payer: Medicaid Other | Admitting: Oncology

## 2011-06-03 VITALS — BP 108/66 | HR 81 | Temp 97.3°F | Ht 72.0 in | Wt 145.3 lb

## 2011-06-03 DIAGNOSIS — C259 Malignant neoplasm of pancreas, unspecified: Secondary | ICD-10-CM

## 2011-06-03 NOTE — Telephone Encounter (Signed)
Gv pt aptp for [phone]

## 2011-06-03 NOTE — Progress Notes (Signed)
OFFICE PROGRESS NOTE   INTERVAL HISTORY:   He completed radiation on 06/01/2011. He reports diarrhea over the last several days of radiation. This has improved.  He saw Dr.McRee at [facility] on 06/02/2011. He is considering enrollment on a phase 1B study with yttrium and gemcitabine.  There has been some improvement in the low abdominal pain following radiation. He continues to take approximately 5 pain pills per day.  Objective:  Vital signs in last 24 hours:  Blood pressure 108/66, pulse 81, temperature 97.3 F (36.3 C), temperature source Oral, height 6' (1.829 m), weight 145 lb 4.8 oz (65.908 kg).   Resp: Lungs clear bilaterally Cardio: Regular rate and rhythm GI: There is a mass at the right upper abdominal wall with associated tenderness. No other mass. No apparent ascites. No hepatomegaly. Mild tenderness in the left lower abdomen. Vascular: No leg edema    Portacath/PICC-without erythema  Lab Results:  Lab Results  Component Value Date   WBC 8.8 05/25/2011   HGB 12.2* 05/25/2011   HCT 37.2* 05/25/2011   MCV 85.7 05/25/2011   PLT 351 05/25/2011      Medications: I have reviewed the patient's current medications.  Assessment/Plan:  1. 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 Histology and immunohistochemical profile were consistent with a poorly-differentiated tumor with neuroendocrine and hepatoid features. The CA19-9 tumor marker was normal at 15.7 on 12/03/2010, and the CEA was normal at 1.7.  a. 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 b. Staging PET scan confirmed increased FDG activity associated with the right rectus sheath mass and multiple peritoneal metastases.  c. Initiation of gemcitabine chemotherapy on the Onconova study 12/21/2010: He completed day 1 of cycle  #2 on 01/18/2011.  d. Restating CT evaluation 01/31/2011 was consistent with disease progression.  e. Initiation of FOLFOX chemotherapy with cycle #1 given on 03/09/2011. He completed cycle 2 on 03/30/2011. He completed cycle 3 on 04/20/2011 with Neulasta support. f. Pelvic CT 05/11/2011 showed multiple lesions around the liver and in the anterior upper abdominal wall had decreased in size; central pelvic lesions had progressed substantially in the interval and were generating prominent mass effect on the rectosigmoid junction. 2. Pain secondary to the rectus sheath mass and pelvic/peritoneal metastases. a. He completed palliative radiation to the abdominal rectus sheath mass on 02/21/2011. b. He completed palliative radiation to the pelvis on 06/01/2011 3. 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 4. Remote history of heroin/cocaine use. 5. Ongoing tobacco use. 6. Port-A-Cath placement 01/06/2011. 7. History of neutropenia secondary to chemotherapy: He received Neulasta support after day 15/cycle #1 gemcitabine chemotherapy. Neulasta was added beginning with cycle 3 FOLFOX. 8. 1-1/2-2 week history of low back pain radiating to the right leg when here 05/04/2011. Dr. Rayburn Ma felt the source of pain may be due to the large disc herniation at the right side at L4/L5. An epidural steroid injection was done 05/21/2011. He has noted improvement in the back pain   Disposition:  He has completed a course of palliative pelvic radiation. He has developed progressive metastatic disease following 2 systemic therapy regimens. He understands "standard "treatment options are limited in this setting. He plans to proceed with enrollment on the [facility] study. He will return for an office visit and Port-A-Cath flush on 04/20/2012. He will contact us in the interim if the diarrhea does not resolve or if he  develops new symptoms.   Lucile Shutters, MD  06/03/2011  2:07 PM

## 2011-06-07 ENCOUNTER — Encounter: Payer: Self-pay | Admitting: Radiation Oncology

## 2011-06-07 NOTE — Progress Notes (Signed)
  Radiation Oncology         [phone]) [phone] ________________________________  Name: [name] MRN: [id number]  Date: 06/07/2011  DOB: 1950/10/17  End of Treatment Note  Diagnosis:   61 year old gentleman with bulky pelvic soft tissue masses from pancreatic cancer     Indication for treatment:  Palliation of pain, preservation of rectal patency       Radiation treatment dates:   05/13/2011 through 06/01/2011  Site/dose:    1. The patient's soft tissue masses in the pelvis were initially treated conformally to 8 Gray in 2 fractions of 4 gray with the intent of initiating an immediate treatment response 2. The same treatment volume was continued using standard fractions of 3 gray for 8 additional treatments for it subtotal dose of 24 gray increasing cumulative dose to the target volume to 32 gray  Beams/energy:    1. A 5 field beam arrangement was employed using equally spaced gantry positions oriented circumferentially around the patient from the anterior.  18 mV photons were applied each field which was conformally designed to cover the tumor plus a 1 cm margin. The patient was set up a daily basis using an alpha cradle treatment device. 2. The same 5 field beam arrangement was employed using equally spaced gantry positions oriented circumferentially around the patient from the anterior.  18 mV photons were applied each field which was conformally designed to cover the tumor plus a 1 cm global margin. The patient was set up a daily basis using an alpha cradle treatment device.  Narrative: The patient tolerated radiation treatment relatively well.   He continued to suffer with pelvic pain during the course radiation. He noted improvement in his bowel habits.  Plan: The patient has completed radiation treatment. The patient will return to radiation oncology clinic for routine followup in one month. I advised them to call or return sooner if they have any questions or concerns related to  their recovery or treatment. ________________________________  Artist Pais. Kathrynn Running, M.D.

## 2011-06-09 ENCOUNTER — Other Ambulatory Visit: Payer: Self-pay | Admitting: *Deleted

## 2011-06-09 DIAGNOSIS — C259 Malignant neoplasm of pancreas, unspecified: Secondary | ICD-10-CM

## 2011-06-09 MED ORDER — OXYCODONE-ACETAMINOPHEN 10-325 MG PO TABS: 1.0000 | ORAL_TABLET | ORAL | Status: DC | PRN

## 2011-06-09 NOTE — Telephone Encounter (Signed)
Message from pt at 0830 requesting refill on his pain medication. Returned call, pt states he just left office. Will return 06/10/11 to pick up rx. Rx left at front desk.

## 2011-06-14 ENCOUNTER — Telehealth: Payer: Self-pay | Admitting: *Deleted

## 2011-06-14 NOTE — Telephone Encounter (Signed)
Notified patient MD agrees to see him on 1/30 at 11am.

## 2011-06-14 NOTE — Telephone Encounter (Signed)
Wants pain med refill on Wednesday and wants to see MD due to pain, swelling and a "firmness" in his abdomen.

## 2011-06-15 ENCOUNTER — Other Ambulatory Visit: Payer: Self-pay | Admitting: *Deleted

## 2011-06-15 ENCOUNTER — Encounter: Payer: Self-pay | Admitting: *Deleted

## 2011-06-15 ENCOUNTER — Ambulatory Visit (HOSPITAL_BASED_OUTPATIENT_CLINIC_OR_DEPARTMENT_OTHER): Payer: Medicaid Other | Admitting: Oncology

## 2011-06-15 VITALS — BP 115/76 | HR 112 | Temp 98.1°F | Ht 72.0 in | Wt 142.3 lb

## 2011-06-15 DIAGNOSIS — C250 Malignant neoplasm of head of pancreas: Secondary | ICD-10-CM

## 2011-06-15 DIAGNOSIS — C259 Malignant neoplasm of pancreas, unspecified: Secondary | ICD-10-CM

## 2011-06-15 MED ORDER — OXYCODONE HCL 10 MG PO TB12: ORAL_TABLET | ORAL | Status: DC

## 2011-06-15 MED ORDER — OXYCODONE-ACETAMINOPHEN 10-325 MG PO TABS: 1.0000 | ORAL_TABLET | ORAL | Status: DC | PRN

## 2011-06-15 MED ORDER — PROCHLORPERAZINE MALEATE 10 MG PO TABS: 10.0000 mg | ORAL_TABLET | Freq: Four times a day (QID) | ORAL | Status: DC | PRN

## 2011-06-15 NOTE — Progress Notes (Signed)
Faxed script to [facility] for rolling walker with seat. Sent with demographic sheet and insurance card.  Social worker, Kathrin Penner listed as contact if issues with payment since patient is in indigent program here.

## 2011-06-15 NOTE — Progress Notes (Signed)
OFFICE PROGRESS NOTE   INTERVAL HISTORY:   He returns prior to a scheduled visit. He reports increased "swelling "at the right upper abdominal wall mass this week. This has improved today. He continues to have pain in the upper abdomen, pelvis, and right leg. He takes approximately 5 oxycodone tablets per day for relief of pain. He requests a trial of OxyContin.  Objective:  Vital signs in last 24 hours:  Blood pressure 115/76, pulse 112, temperature 98.1 F (36.7 C), temperature source Oral, height 6' (1.829 m), weight 142 lb 4.8 oz (64.547 kg).   Resp: Lungs clear bilaterally Cardio: Regular rate and rhythm GI: The abdomen is soft. There is a 3-4 cm right upper abdominal wall mass. No erythema. No fluctuance. No apparent ascites. No other mass. Vascular: No leg edema    Lab Results:  Lab Results  Component Value Date   WBC 8.8 05/25/2011   HGB 12.2* 05/25/2011   HCT 37.2* 05/25/2011   MCV 85.7 05/25/2011   PLT 351 05/25/2011      Medications: I have reviewed the patient's current medications.  Assessment/Plan: 1. 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 Histology and immunohistochemical profile were consistent with a poorly-differentiated tumor with neuroendocrine and hepatoid features. The CA19-9 tumor marker was normal at 15.7 on 12/03/2010, and the CEA was normal at 1.7.  a. 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 b. Staging PET scan confirmed increased FDG activity associated with the right rectus sheath mass and multiple peritoneal metastases.  c. Initiation of gemcitabine chemotherapy on the Onconova study 12/21/2010: He completed day 1 of cycle #2 on 01/18/2011.  d. Restating CT evaluation 01/31/2011 was consistent with disease progression.  e. Initiation of FOLFOX  chemotherapy with cycle #1 given on 03/09/2011. He completed cycle 2 on 03/30/2011. He completed cycle 3 on 04/20/2011 with Neulasta support. f. Pelvic CT 05/11/2011 showed multiple lesions around the liver and in the anterior upper abdominal wall had decreased in size; central pelvic lesions had progressed substantially in the interval and were generating prominent mass effect on the rectosigmoid junction. 2. Pain secondary to the rectus sheath mass and pelvic/peritoneal metastases. a. He completed palliative radiation to the abdominal rectus sheath mass on 02/21/2011. b. He completed palliative radiation to the pelvis on 06/01/2011 3. 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 4. Remote history of heroin/cocaine use. 5. Ongoing tobacco use. 6. Port-A-Cath placement 01/06/2011. 7. History of neutropenia secondary to chemotherapy: He received Neulasta support after day 15/cycle #1 gemcitabine chemotherapy. Neulasta was added beginning with cycle 3 FOLFOX. 8. 1-1/2-2 week history of low back pain radiating to the right leg when here 05/04/2011. Dr. Rayburn Ma felt the source of pain may be due to the large disc herniation at the right side at L4/L5. An epidural steroid injection was done 05/21/2011. He has noted improvement in the back pain. He continues to have pain in the right leg that is worse with weightbearing.  Disposition:  He will continue oxycodone as needed. He will take OxyContin at a dose of 20 mg in the morning and 10 mg in the evening.  Mr. Greek return for mouth visit is scheduled in one week. He is scheduled to be seen at [facility] on 06/16/2011 to discuss protocol treatment options.   Lucile Shutters, MD  06/15/2011  6:35 PM

## 2011-06-15 NOTE — Progress Notes (Signed)
[facility] faxed prior authorization request for oxycontin 10 mg.  Request to [employer].

## 2011-06-16 ENCOUNTER — Encounter: Payer: Self-pay | Admitting: Oncology

## 2011-06-16 NOTE — Progress Notes (Signed)
Oxycontin 10mg  90 tabs has been approved through Medicaid from 06/15/11-06/14/12.

## 2011-06-17 ENCOUNTER — Encounter: Payer: Self-pay | Admitting: *Deleted

## 2011-06-17 ENCOUNTER — Ambulatory Visit (HOSPITAL_BASED_OUTPATIENT_CLINIC_OR_DEPARTMENT_OTHER): Payer: Medicaid Other

## 2011-06-17 ENCOUNTER — Other Ambulatory Visit: Payer: Self-pay | Admitting: *Deleted

## 2011-06-17 DIAGNOSIS — C259 Malignant neoplasm of pancreas, unspecified: Secondary | ICD-10-CM

## 2011-06-17 DIAGNOSIS — Z469 Encounter for fitting and adjustment of unspecified device: Secondary | ICD-10-CM

## 2011-06-17 MED ORDER — HEPARIN SOD (PORK) LOCK FLUSH 100 UNIT/ML IV SOLN
500.0000 [IU] | Freq: Once | INTRAVENOUS | Status: AC
  Administered 2011-06-17: 500 [IU] via INTRAVENOUS
  Filled 2011-06-17: qty 5

## 2011-06-17 MED ORDER — SODIUM CHLORIDE 0.9 % IJ SOLN
10.0000 mL | INTRAMUSCULAR | Status: DC | PRN
  Administered 2011-06-17: 10 mL via INTRAVENOUS
  Filled 2011-06-17: qty 10

## 2011-06-17 MED ORDER — ALTEPLASE 2 MG IJ SOLR
2.0000 mg | Freq: Once | INTRAMUSCULAR | Status: DC | PRN
  Filled 2011-06-17: qty 2

## 2011-06-17 MED ORDER — HEPARIN SOD (PORK) LOCK FLUSH 100 UNIT/ML IV SOLN
500.0000 [IU] | Freq: Once | INTRAVENOUS | Status: DC
  Filled 2011-06-17: qty 5

## 2011-06-17 MED ORDER — SODIUM CHLORIDE 0.9 % IJ SOLN
10.0000 mL | INTRAMUSCULAR | Status: DC | PRN
  Filled 2011-06-17: qty 10

## 2011-06-17 NOTE — Progress Notes (Signed)
06/17/11- Research note - The pt is in follow up on the Onconova 04-22 study.  He is in screening at [facility] for a phase 1b study.  He called me today and stated that his port was still accessed.  He said that his CT was canceled yesterday.  He said that his CT will be done on Monday at 9am.  He said that he was unsure if he needed to have the port de-accessed over the weekend.  Rn called the CT department and his research nurse at [facility], to inquire.  His [facility] research nurse stated that he was supposed to have it deaccessed before he left [facility] yesterday.  She said that no one reminded him about the port before he left.  She advised that pt come here to the [facility] and have it de-accessed and flush.  Rn called the pt and he is coming in at 4:15pm today for a flush appt.  Meredith in the flush room is aware of the circumstances.  Darl Pikes, Dr. Kalman Drape nurse is also aware of the port issue.

## 2011-06-22 ENCOUNTER — Ambulatory Visit (HOSPITAL_BASED_OUTPATIENT_CLINIC_OR_DEPARTMENT_OTHER): Payer: Medicaid Other | Admitting: Nurse Practitioner

## 2011-06-22 ENCOUNTER — Other Ambulatory Visit: Payer: Self-pay | Admitting: *Deleted

## 2011-06-22 ENCOUNTER — Other Ambulatory Visit (HOSPITAL_BASED_OUTPATIENT_CLINIC_OR_DEPARTMENT_OTHER): Payer: Medicaid Other | Admitting: Lab

## 2011-06-22 VITALS — BP 120/71 | HR 77 | Temp 99.1°F | Ht 72.0 in | Wt 145.6 lb

## 2011-06-22 DIAGNOSIS — C259 Malignant neoplasm of pancreas, unspecified: Secondary | ICD-10-CM

## 2011-06-22 DIAGNOSIS — F17200 Nicotine dependence, unspecified, uncomplicated: Secondary | ICD-10-CM

## 2011-06-22 DIAGNOSIS — G893 Neoplasm related pain (acute) (chronic): Secondary | ICD-10-CM

## 2011-06-22 DIAGNOSIS — M545 Low back pain: Secondary | ICD-10-CM

## 2011-06-22 LAB — COMPREHENSIVE METABOLIC PANEL
BUN: 7 mg/dL (ref 6–23)
CO2: 29 mEq/L (ref 19–32)
Creatinine, Ser: 0.8 mg/dL (ref 0.50–1.35)
Glucose, Bld: 92 mg/dL (ref 70–99)
Total Bilirubin: 0.2 mg/dL — ABNORMAL LOW (ref 0.3–1.2)
Total Protein: 7.4 g/dL (ref 6.0–8.3)

## 2011-06-22 LAB — CBC WITH DIFFERENTIAL/PLATELET
Basophils Absolute: 0 10*3/uL (ref 0.0–0.1)
Eosinophils Absolute: 0.1 10*3/uL (ref 0.0–0.5)
HCT: 34.9 % — ABNORMAL LOW (ref 38.4–49.9)
LYMPH%: 12.4 % — ABNORMAL LOW (ref 14.0–49.0)
MCV: 84.9 fL (ref 79.3–98.0)
MONO#: 0.4 10*3/uL (ref 0.1–0.9)
NEUT#: 3.9 10*3/uL (ref 1.5–6.5)
NEUT%: 78.3 % — ABNORMAL HIGH (ref 39.0–75.0)
Platelets: 416 10*3/uL — ABNORMAL HIGH (ref 140–400)
WBC: 5 10*3/uL (ref 4.0–10.3)

## 2011-06-22 MED ORDER — OXYCODONE-ACETAMINOPHEN 10-325 MG PO TABS: 1.0000 | ORAL_TABLET | ORAL | Status: DC | PRN

## 2011-06-22 NOTE — Progress Notes (Signed)
OFFICE PROGRESS NOTE  Interval history:  [name] returns as scheduled. He notes some improvement in abdominal pain since beginning OxyContin. He continues Percocet for breakthrough pain. No nausea or vomiting. Bowels regularly. He takes MiraLAX as needed.   Objective: Blood pressure 120/71, pulse 77, temperature 99.1 F (37.3 C), temperature source Oral, height 6' (1.829 m), weight 145 lb 9.6 oz (66.044 kg).  Oropharynx is without thrush or ulceration. Lungs are clear. Regular cardiac rhythm. Port-A-Cath site is without erythema. Abdomen is soft. 3-4 cm right upper abdominal wall mass. Extremities without edema.  Lab Results: Lab Results  Component Value Date   WBC 5.0 06/22/2011   HGB 11.6* 06/22/2011   HCT 34.9* 06/22/2011   MCV 84.9 06/22/2011   PLT 416* 06/22/2011    Chemistry:    Chemistry      Component Value Date/Time   NA 135 06/22/2011 0958   K 4.0 06/22/2011 0958   CL 97 06/22/2011 0958   CO2 29 06/22/2011 0958   BUN 7 06/22/2011 0958   CREATININE 0.80 06/22/2011 0958      Component Value Date/Time   CALCIUM 9.3 06/22/2011 0958   ALKPHOS 146* 06/22/2011 0958   AST 31 06/22/2011 0958   ALT 15 06/22/2011 0958   BILITOT 0.2* 06/22/2011 0958       Studies/Results: No results found.  Medications: I have reviewed the patient's current medications.  Assessment/Plan:  1. 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 Histology and immunohistochemical profile were consistent with a poorly-differentiated tumor with neuroendocrine and hepatoid features. The CA19-9 tumor marker was normal at 15.7 on 12/03/2010, and the CEA was normal at 1.7.  a. 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 b. Staging PET scan confirmed increased FDG activity associated with the right rectus sheath mass and  multiple peritoneal metastases.  c. Initiation of gemcitabine chemotherapy on the Onconova study 12/21/2010: He completed day 1 of cycle #2 on 01/18/2011.  d. Restating CT evaluation 01/31/2011 was consistent with disease progression.  e. Initiation of FOLFOX chemotherapy with cycle #1 given on 03/09/2011. He completed cycle 2 on 03/30/2011. He completed cycle 3 on 04/20/2011 with Neulasta support. f. Pelvic CT 05/11/2011 showed multiple lesions around the liver and in the anterior upper abdominal wall had decreased in size; central pelvic lesions had progressed substantially in the interval and were generating prominent mass effect on the rectosigmoid junction. 2. Pain secondary to the rectus sheath mass and pelvic/peritoneal metastases. a. He completed palliative radiation to the abdominal rectus sheath mass on 02/21/2011. b. He completed palliative radiation to the pelvis on 06/01/2011 3. 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 4. Remote history of heroin/cocaine use. 5. Ongoing tobacco use. 6. Port-A-Cath placement 01/06/2011. 7. History of neutropenia secondary to chemotherapy: He received Neulasta support after day 15/cycle #1 gemcitabine chemotherapy. Neulasta was added beginning with cycle 3 FOLFOX. 8. 1-1/2-2 week history of low back pain radiating to the right leg when here 05/04/2011. Dr. Rayburn Ma felt the source of pain may be due to the large disc herniation at the right side at L4/L5. An epidural steroid injection was done 05/21/2011. He has noted improvement in the back pain. He continues to have pain in the right leg that is worse with weightbearing.  Disposition-[name] is being considered for enrollment on a clinical trial at [facility]. He will return for a followup  visit here in approximately 5 weeks. He will contact the office the interim with any problems.  Plan reviewed with Dr. Truett Perna.  Lonna Cobb ANP/GNP-BC

## 2011-06-28 ENCOUNTER — Other Ambulatory Visit: Payer: Self-pay | Admitting: *Deleted

## 2011-06-28 DIAGNOSIS — C259 Malignant neoplasm of pancreas, unspecified: Secondary | ICD-10-CM

## 2011-06-28 MED ORDER — OXYCODONE-ACETAMINOPHEN 10-325 MG PO TABS: 1.0000 | ORAL_TABLET | ORAL | Status: DC | PRN

## 2011-06-28 NOTE — Telephone Encounter (Signed)
Requests to pick up Percocet refill tomorrow am.

## 2011-06-30 ENCOUNTER — Encounter: Payer: Self-pay | Admitting: *Deleted

## 2011-06-30 DIAGNOSIS — Z923 Personal history of irradiation: Secondary | ICD-10-CM | POA: Insufficient documentation

## 2011-07-01 ENCOUNTER — Ambulatory Visit: Payer: Medicaid Other | Admitting: Radiation Oncology

## 2011-07-05 ENCOUNTER — Other Ambulatory Visit: Payer: Self-pay | Admitting: *Deleted

## 2011-07-05 DIAGNOSIS — C259 Malignant neoplasm of pancreas, unspecified: Secondary | ICD-10-CM

## 2011-07-05 MED ORDER — OXYCODONE HCL 20 MG PO TB12: 20.0000 mg | ORAL_TABLET | Freq: Two times a day (BID) | ORAL | Status: DC

## 2011-07-05 MED ORDER — OXYCODONE-ACETAMINOPHEN 10-325 MG PO TABS: 1.0000 | ORAL_TABLET | ORAL | Status: DC | PRN

## 2011-07-05 NOTE — Telephone Encounter (Signed)
Asking for refill on Oxycontin and for MD to increase to 15 mg tabs and take 30 mg twice daily. Has been taking 20 mg twice daily and still needing his prn med just as often for pain. Would like to pick up scripts tomorrow.

## 2011-07-05 NOTE — Telephone Encounter (Signed)
Per Dr. Truett Perna : OK to refill Oxycontin for 20 mg twice daily--may increase dose with next fill if necessary. Patient notified that BP med still has #3 refills on file.

## 2011-07-14 ENCOUNTER — Other Ambulatory Visit: Payer: Self-pay | Admitting: *Deleted

## 2011-07-14 DIAGNOSIS — C259 Malignant neoplasm of pancreas, unspecified: Secondary | ICD-10-CM

## 2011-07-14 MED ORDER — OXYCODONE-ACETAMINOPHEN 10-325 MG PO TABS: 1.0000 | ORAL_TABLET | ORAL | Status: DC | PRN

## 2011-07-19 ENCOUNTER — Other Ambulatory Visit: Payer: Self-pay | Admitting: *Deleted

## 2011-07-19 DIAGNOSIS — C259 Malignant neoplasm of pancreas, unspecified: Secondary | ICD-10-CM

## 2011-07-19 MED ORDER — OXYCODONE-ACETAMINOPHEN 10-325 MG PO TABS: 1.0000 | ORAL_TABLET | ORAL | Status: DC | PRN

## 2011-07-19 NOTE — Telephone Encounter (Addendum)
Left message for refill on Percocet that he will pick up tomorrow. Wants nurse to call in refill on his BP med also. Confirmed with [facility] that he still had refills on the metoprolol 25 mg but he has not been coming to the [facility]. He has been filling his meds at the [facility] on [address]. Called pharmacy and requested they fill script and transfer his script to the [address] location.

## 2011-07-26 ENCOUNTER — Other Ambulatory Visit: Payer: Self-pay | Admitting: *Deleted

## 2011-07-26 DIAGNOSIS — C259 Malignant neoplasm of pancreas, unspecified: Secondary | ICD-10-CM

## 2011-07-26 MED ORDER — OXYCODONE-ACETAMINOPHEN 10-325 MG PO TABS: 1.0000 | ORAL_TABLET | ORAL | Status: DC | PRN

## 2011-07-26 NOTE — Telephone Encounter (Signed)
Left VM on cell # that he should not come in to office for his script until he is called. MD is aware of his refills from [facility] on 07/22/11 and needs to research further before any refill approval. He will be called when ready.

## 2011-07-26 NOTE — Telephone Encounter (Signed)
Patient called to request refill on Percocet---would like to pick up script tomorrow. Noted from note from [facility] that Dr. Victoriano Lain gave him script for Oxycontin 30 mg and  Percocet on his visit there 07/22/11. [facility] and spoke with nurse, who confirmed he was given script for #60 Oxycontin and #90 Percocet on 07/22/11 visit. They investigated data base for  and found that he is getting narcotics from an ortho surgeon and a family practice physician.  Dr. Truett Perna notified. Will not refill at this time pending our pharmacy investigation of his refill history.

## 2011-08-01 ENCOUNTER — Encounter: Payer: Self-pay | Admitting: *Deleted

## 2011-08-01 ENCOUNTER — Other Ambulatory Visit: Payer: Self-pay | Admitting: *Deleted

## 2011-08-01 DIAGNOSIS — C259 Malignant neoplasm of pancreas, unspecified: Secondary | ICD-10-CM

## 2011-08-01 MED ORDER — OXYCODONE-ACETAMINOPHEN 10-325 MG PO TABS: 1.0000 | ORAL_TABLET | ORAL | Status: DC | PRN

## 2011-08-01 NOTE — Telephone Encounter (Signed)
Patient presents to clinic for refill on his Percocet 10/325. Dr. Truett Perna agrees to fill his weekly #42 quantity. Made patient aware that Dr. Magnus Ivan and [facility] will no longer be writing for his narcotics. He needs to obtain his narcotics from only one physician. Made him aware that his narcotic fill activity is able to be followed per [place] database. Expresses anger and feels that doctors sharing this information is "illegal" and plans to talk with his attorney.

## 2011-08-01 NOTE — Progress Notes (Signed)
08/01/11 Lucile Shutters 04-22 follow up notes- The research nurse called and spoke to Lizabeth Leyden, study nurse at [facility].  She confirmed that the pt is still being treated on protocol at [facility] (gemcitabine and HPAM).  She forwarded the research nurse his latest clinic notes.  The pt has not had any scans since 06/20/11 (baseline scans) to determine his response to treatment.  Rn reported his progression dated 05/11/11 since it was not reported at his last follow up.  The pt did receive palliative radiation from 05/13/11 - 06/01/11.  Rn then called the pt to check on his status.  He states he is doing well.  He said that he was coming over to the cancer center this afternoon to pick up his prescription.  The pt said that he was upset that people are so interested in his pharmacies and his prescriptions.  He said that no one was concerned about his cancer.  The research nurse stated that his team is very interested in his progress and response to his treatment.  The pt's next follow up is due on 11/01/11.

## 2011-08-04 ENCOUNTER — Telehealth: Payer: Self-pay | Admitting: *Deleted

## 2011-08-04 NOTE — Telephone Encounter (Signed)
Call from pt asking if he will be able to fill Oxycontin rx he received from [facility]. (Was too soon to fill when he got it). Informed him that Dr. Truett Perna is aware of the rx, it should still be valid and this office will write future rx when due.  He verbalized understanding.

## 2011-08-05 ENCOUNTER — Telehealth: Payer: Self-pay | Admitting: *Deleted

## 2011-08-05 MED ORDER — OXYCODONE HCL 10 MG PO TB12: ORAL_TABLET | ORAL | Status: DC

## 2011-08-05 NOTE — Telephone Encounter (Signed)
Received message from Rosey Bath, RN research at [facility] reporting they received prior auth request. Returned call, informed Rosey Bath that Dr. Truett Perna will manage all narcotic prescriptions, [facility] has been notified to [facility]. Pt is aware to pick up new rx at this office.

## 2011-08-05 NOTE — Telephone Encounter (Signed)
Call from pt reporting stating pharmacy told him he needs prior authorization for increased Oxycontin dose written by Dr. Hollice Espy at [facility]. Pt raising his voice and demanding I deal with the situation. Spoke with Jo[facility] Oms at [facility]- she sent prior auth  Request to [facility]. Returned call to pt, Dr. Truett Perna does not plan to increase Oxycontin dose, will refill med at original dosage. Pt requests a refill at original dose, asks that we [facility] [address]. Returned call to [facility] at [facility] [address] to cancel prescription for Oxycontin. Pt will pick up new rx in the office.

## 2011-08-08 ENCOUNTER — Other Ambulatory Visit: Payer: Self-pay | Admitting: *Deleted

## 2011-08-08 DIAGNOSIS — C259 Malignant neoplasm of pancreas, unspecified: Secondary | ICD-10-CM

## 2011-08-08 MED ORDER — OXYCODONE-ACETAMINOPHEN 10-325 MG PO TABS: 1.0000 | ORAL_TABLET | ORAL | Status: DC | PRN

## 2011-08-08 NOTE — Telephone Encounter (Signed)
Left VM requesting refill on Percocet and will be in this afternoon to pick up.

## 2011-08-15 ENCOUNTER — Other Ambulatory Visit: Payer: Self-pay | Admitting: *Deleted

## 2011-08-15 DIAGNOSIS — C259 Malignant neoplasm of pancreas, unspecified: Secondary | ICD-10-CM

## 2011-08-15 MED ORDER — OXYCODONE-ACETAMINOPHEN 10-325 MG PO TABS: 1.0000 | ORAL_TABLET | ORAL | Status: DC | PRN

## 2011-08-15 NOTE — Telephone Encounter (Signed)
VM left by patient requesting Percocet refill and will pick up later this morning.

## 2011-08-19 ENCOUNTER — Other Ambulatory Visit: Payer: Self-pay | Admitting: *Deleted

## 2011-08-19 DIAGNOSIS — C259 Malignant neoplasm of pancreas, unspecified: Secondary | ICD-10-CM

## 2011-08-19 MED ORDER — OXYCODONE-ACETAMINOPHEN 10-325 MG PO TABS: 1.0000 | ORAL_TABLET | ORAL | Status: DC | PRN

## 2011-08-19 NOTE — Telephone Encounter (Signed)
Patient called to request Percocet refill--would like to pick up on Monday am.

## 2011-08-26 ENCOUNTER — Other Ambulatory Visit: Payer: Self-pay | Admitting: *Deleted

## 2011-08-26 DIAGNOSIS — C259 Malignant neoplasm of pancreas, unspecified: Secondary | ICD-10-CM

## 2011-08-26 MED ORDER — OXYCODONE-ACETAMINOPHEN 10-325 MG PO TABS: 1.0000 | ORAL_TABLET | ORAL | Status: DC | PRN

## 2011-08-26 NOTE — Telephone Encounter (Signed)
Call from pt requesting refill on Percocet tablets to pick up on 4/15.

## 2011-09-02 ENCOUNTER — Other Ambulatory Visit: Payer: Self-pay | Admitting: *Deleted

## 2011-09-02 DIAGNOSIS — C259 Malignant neoplasm of pancreas, unspecified: Secondary | ICD-10-CM

## 2011-09-02 MED ORDER — OXYCODONE HCL 10 MG PO TB12: ORAL_TABLET | ORAL | Status: DC

## 2011-09-02 MED ORDER — OXYCODONE-ACETAMINOPHEN 10-325 MG PO TABS: 1.0000 | ORAL_TABLET | ORAL | Status: DC | PRN

## 2011-09-02 NOTE — Telephone Encounter (Signed)
Requesting to pick up refill scripts on 09/05/11

## 2011-09-05 ENCOUNTER — Encounter: Payer: Self-pay | Admitting: Oncology

## 2011-09-05 NOTE — Progress Notes (Signed)
Patient stop by today to pick up a 31-day bus pass,and he was inform that  He has no more funds left in the [facility] fund.

## 2011-09-09 ENCOUNTER — Other Ambulatory Visit: Payer: Self-pay | Admitting: *Deleted

## 2011-09-09 DIAGNOSIS — C259 Malignant neoplasm of pancreas, unspecified: Secondary | ICD-10-CM

## 2011-09-09 MED ORDER — OXYCODONE-ACETAMINOPHEN 10-325 MG PO TABS: 1.0000 | ORAL_TABLET | ORAL | Status: DC | PRN

## 2011-09-09 NOTE — Telephone Encounter (Signed)
Call from pt requesting refill of Percocet for 4/29. Rx left at front desk to be picked up.

## 2011-09-16 ENCOUNTER — Other Ambulatory Visit: Payer: Self-pay | Admitting: *Deleted

## 2011-09-16 DIAGNOSIS — C259 Malignant neoplasm of pancreas, unspecified: Secondary | ICD-10-CM

## 2011-09-16 MED ORDER — OXYCODONE-ACETAMINOPHEN 10-325 MG PO TABS: 1.0000 | ORAL_TABLET | ORAL | Status: DC | PRN

## 2011-09-16 NOTE — Telephone Encounter (Signed)
Patient called to pick up Percocet script on Monday.

## 2011-09-26 ENCOUNTER — Other Ambulatory Visit: Payer: Self-pay | Admitting: *Deleted

## 2011-09-26 DIAGNOSIS — C259 Malignant neoplasm of pancreas, unspecified: Secondary | ICD-10-CM

## 2011-09-26 MED ORDER — OXYCODONE-ACETAMINOPHEN 10-325 MG PO TABS: 1.0000 | ORAL_TABLET | ORAL | Status: DC | PRN

## 2011-09-30 ENCOUNTER — Other Ambulatory Visit: Payer: Self-pay | Admitting: *Deleted

## 2011-09-30 DIAGNOSIS — C259 Malignant neoplasm of pancreas, unspecified: Secondary | ICD-10-CM

## 2011-09-30 MED ORDER — OXYCODONE HCL 10 MG PO TB12: ORAL_TABLET | ORAL | Status: DC

## 2011-09-30 MED ORDER — OXYCODONE-ACETAMINOPHEN 10-325 MG PO TABS: 1.0000 | ORAL_TABLET | ORAL | Status: DC | PRN

## 2011-09-30 NOTE — Telephone Encounter (Signed)
VM from patient requesting refills on pain meds for Monday pick up.

## 2011-10-03 ENCOUNTER — Encounter: Payer: Self-pay | Admitting: Oncology

## 2011-10-03 NOTE — Progress Notes (Signed)
Patient came by this morning needing a bus pass,I told him that he has no more funds and the last bus pass that I gave him that was it. He said he forgot and that he was sorry.

## 2011-10-07 ENCOUNTER — Other Ambulatory Visit: Payer: Self-pay | Admitting: *Deleted

## 2011-10-07 DIAGNOSIS — C259 Malignant neoplasm of pancreas, unspecified: Secondary | ICD-10-CM

## 2011-10-07 MED ORDER — OXYCODONE-ACETAMINOPHEN 10-325 MG PO TABS: 1.0000 | ORAL_TABLET | ORAL | Status: DC | PRN

## 2011-10-07 NOTE — Telephone Encounter (Signed)
Call from pt requesting Percocet refill to pick up on 10/11/11.

## 2011-10-14 ENCOUNTER — Other Ambulatory Visit: Payer: Self-pay | Admitting: *Deleted

## 2011-10-14 DIAGNOSIS — C259 Malignant neoplasm of pancreas, unspecified: Secondary | ICD-10-CM

## 2011-10-14 MED ORDER — OXYCODONE-ACETAMINOPHEN 10-325 MG PO TABS: 1.0000 | ORAL_TABLET | ORAL | Status: DC | PRN

## 2011-10-14 NOTE — Telephone Encounter (Signed)
Script in book for pick up on Monday per request.

## 2011-10-21 ENCOUNTER — Other Ambulatory Visit: Payer: Self-pay | Admitting: *Deleted

## 2011-10-21 DIAGNOSIS — C259 Malignant neoplasm of pancreas, unspecified: Secondary | ICD-10-CM

## 2011-10-21 MED ORDER — OXYCODONE-ACETAMINOPHEN 10-325 MG PO TABS: 1.0000 | ORAL_TABLET | ORAL | Status: DC | PRN

## 2011-10-26 ENCOUNTER — Other Ambulatory Visit: Payer: Self-pay | Admitting: *Deleted

## 2011-10-26 DIAGNOSIS — C259 Malignant neoplasm of pancreas, unspecified: Secondary | ICD-10-CM

## 2011-10-26 MED ORDER — METOPROLOL TARTRATE 25 MG PO TABS: 25.0000 mg | ORAL_TABLET | Freq: Every day | ORAL | Status: DC

## 2011-10-26 NOTE — Telephone Encounter (Signed)
Message from pt requesting refill of Metoprolol 25mg . Refill sent electronically on behalf of Misty Stanley, NP.

## 2011-10-28 ENCOUNTER — Other Ambulatory Visit: Payer: Self-pay | Admitting: *Deleted

## 2011-10-28 DIAGNOSIS — C259 Malignant neoplasm of pancreas, unspecified: Secondary | ICD-10-CM

## 2011-10-28 MED ORDER — OXYCODONE HCL 10 MG PO TB12: ORAL_TABLET | ORAL | Status: DC

## 2011-10-28 MED ORDER — OXYCODONE-ACETAMINOPHEN 10-325 MG PO TABS: 1.0000 | ORAL_TABLET | ORAL | Status: DC | PRN

## 2011-11-04 ENCOUNTER — Other Ambulatory Visit: Payer: Self-pay | Admitting: *Deleted

## 2011-11-04 DIAGNOSIS — C259 Malignant neoplasm of pancreas, unspecified: Secondary | ICD-10-CM

## 2011-11-04 MED ORDER — OXYCODONE-ACETAMINOPHEN 10-325 MG PO TABS: 1.0000 | ORAL_TABLET | ORAL | Status: DC | PRN

## 2011-11-04 NOTE — Telephone Encounter (Signed)
Received call from pt asking for refill on his percocet & will p/u on Monday.  OK per Dr. Truett Perna & script will be ready.

## 2011-11-11 ENCOUNTER — Other Ambulatory Visit: Payer: Self-pay | Admitting: *Deleted

## 2011-11-11 DIAGNOSIS — C259 Malignant neoplasm of pancreas, unspecified: Secondary | ICD-10-CM

## 2011-11-11 MED ORDER — OXYCODONE-ACETAMINOPHEN 10-325 MG PO TABS: 1.0000 | ORAL_TABLET | ORAL | Status: DC | PRN

## 2011-11-18 ENCOUNTER — Telehealth: Payer: Self-pay | Admitting: *Deleted

## 2011-11-18 DIAGNOSIS — C259 Malignant neoplasm of pancreas, unspecified: Secondary | ICD-10-CM

## 2011-11-18 MED ORDER — OXYCODONE-ACETAMINOPHEN 10-325 MG PO TABS: 1.0000 | ORAL_TABLET | ORAL | Status: DC | PRN

## 2011-11-18 NOTE — Telephone Encounter (Signed)
Calling to pick up refill on his Percocet for Monday. [facility] has instructed him to get appointment to be seen soon due to his elevated BP readings.

## 2011-11-23 ENCOUNTER — Telehealth: Payer: Self-pay | Admitting: *Deleted

## 2011-11-23 ENCOUNTER — Telehealth: Payer: Self-pay | Admitting: Oncology

## 2011-11-23 NOTE — Telephone Encounter (Signed)
S/w the pt and he is aware of his July appt

## 2011-11-23 NOTE — Telephone Encounter (Signed)
Left message on voicemail for pt to call office. Request to schedulers for appt within 2 weeks.

## 2011-11-25 ENCOUNTER — Other Ambulatory Visit: Payer: Self-pay | Admitting: *Deleted

## 2011-11-25 DIAGNOSIS — C259 Malignant neoplasm of pancreas, unspecified: Secondary | ICD-10-CM

## 2011-11-25 MED ORDER — OXYCODONE-ACETAMINOPHEN 10-325 MG PO TABS: 1.0000 | ORAL_TABLET | ORAL | Status: DC | PRN

## 2011-12-02 ENCOUNTER — Other Ambulatory Visit: Payer: Self-pay | Admitting: *Deleted

## 2011-12-02 DIAGNOSIS — C259 Malignant neoplasm of pancreas, unspecified: Secondary | ICD-10-CM

## 2011-12-02 MED ORDER — OXYCODONE HCL 10 MG PO TB12: ORAL_TABLET | ORAL | Status: DC

## 2011-12-02 MED ORDER — OXYCODONE-ACETAMINOPHEN 10-325 MG PO TABS: 1.0000 | ORAL_TABLET | ORAL | Status: DC | PRN

## 2011-12-04 ENCOUNTER — Emergency Department (HOSPITAL_COMMUNITY)
Admission: EM | Admit: 2011-12-04 | Discharge: 2011-12-04 | Disposition: A | Discharge status: Home / Self Care | Payer: Medicaid Other | Attending: Emergency Medicine | Admitting: Emergency Medicine

## 2011-12-04 ENCOUNTER — Encounter (HOSPITAL_COMMUNITY): Payer: Self-pay | Admitting: Nurse Practitioner

## 2011-12-04 DIAGNOSIS — C259 Malignant neoplasm of pancreas, unspecified: Secondary | ICD-10-CM | POA: Insufficient documentation

## 2011-12-04 DIAGNOSIS — F17200 Nicotine dependence, unspecified, uncomplicated: Secondary | ICD-10-CM | POA: Insufficient documentation

## 2011-12-04 DIAGNOSIS — M1990 Unspecified osteoarthritis, unspecified site: Secondary | ICD-10-CM | POA: Insufficient documentation

## 2011-12-04 DIAGNOSIS — G8929 Other chronic pain: Secondary | ICD-10-CM | POA: Insufficient documentation

## 2011-12-04 DIAGNOSIS — I10 Essential (primary) hypertension: Secondary | ICD-10-CM | POA: Insufficient documentation

## 2011-12-04 MED ORDER — OXYCODONE HCL 10 MG PO TB12: 10.0000 mg | ORAL_TABLET | Freq: Two times a day (BID) | ORAL | Status: DC

## 2011-12-04 MED ORDER — HYDROMORPHONE HCL PF 2 MG/ML IJ SOLN
2.0000 mg | Freq: Once | INTRAMUSCULAR | Status: AC
  Administered 2011-12-04: 2 mg via INTRAMUSCULAR
  Filled 2011-12-04: qty 1

## 2011-12-04 MED ORDER — OXYCODONE-ACETAMINOPHEN 10-325 MG PO TABS: 1.0000 | ORAL_TABLET | Freq: Four times a day (QID) | ORAL | Status: DC | PRN

## 2011-12-04 NOTE — ED Notes (Signed)
States he has terminal pancreatic cancer and is requesting pain medication, is being treated for cancer at [facility]. States pain was too bad last night and he does not want to sit at home all day in pain

## 2011-12-04 NOTE — ED Provider Notes (Signed)
History  Scribed for Shelda Jakes, MD, the patient was seen in room TR06C/TR06C. This chart was scribed by Candelaria Stagers. The patient's care started at 11:28 AM    CSN: [id number]  Arrival date & time 12/04/11  1110   First MD Initiated Contact with Patient 12/04/11 1121      Chief Complaint  Patient presents with  . Abdominal Pain   The history is provided by the patient.   [name] is a 61 y.o. male who presents to the Emergency Department complaining of abdominal pain that became worse last night after running out of pain medications .  Pt has a h/o pancreatic cancer and is being treated at [facility].  He reports that he is also experiencing a fever.  Nothing seems to make the pain better or worse.   Past Medical History  Diagnosis Date  . Diarrhea   . Nausea   . Rash     on abdomen from radiation   . Chronic back pain     r/t disc disease--w/bilateral leg pain  . History of drug abuse     Remote--heroin & cocaine  . 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 Hypertension   . Osteoarthritis   . Pancreatitis   . 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 Hx of radiation therapy 02/08/11 to 02/21/11    palliative- RUQ abd, R lateral hepatic mets  . Pancreatic cancer 03/2010    poorly differentiated adenocarcinoma  . Hx of radiation therapy 05/13/11 to 06/01/11    pelvis    Past Surgical History  Procedure Date  . Whipple procedure 02/08/2010  . Portacath placement 01/06/11    8/15/12tip in superior cavoatrial junction  . Hernia repair     inguinal hernia repair as a child  . Dx laparoscopy 01/13/2011  . Right total hip replacement 08/14/2010  . Hydrocele excision / repair     Family History  Problem Relation Age of Onset  . Cancer Mother   . Cancer Sister   . Cancer Sister     History  Substance Use Topics  . Smoking status: Current Everyday Smoker -- 0.5 packs/day for 30 years    Types: Cigarettes  . Smokeless  tobacco: Never Used  . Alcohol Use: No     occasional basis      Review of Systems  Constitutional: Positive for fever.  Respiratory: Positive for shortness of breath.   Cardiovascular: Negative for chest pain.  Gastrointestinal: Positive for abdominal pain.  All other systems reviewed and are negative.    Allergies  Hydromorphone and Demerol  Home Medications   Current Outpatient Rx  Name Route Sig Dispense Refill  . LIDOCAINE-PRILOCAINE 2.5-2.5 % EX CREA Topical Apply topically as needed.      Marland Kitchen METOPROLOL TARTRATE 25 MG PO TABS Oral Take 1 tablet (25 mg total) by mouth daily. 30 tablet 3  . OXYCODONE HCL ER 10 MG PO TB12  Take 20 mg QAM 10 mg QPM 90 tablet 0  . OXYCODONE HCL ER 10 MG PO TB12 Oral Take 1 tablet (10 mg total) by mouth every 12 (twelve) hours. 20 tablet 0  . OXYCODONE-ACETAMINOPHEN 10-325 MG PO TABS Oral Take 1 tablet by mouth every 4 (four) hours as needed for pain (severe pain). 42 tablet 0  . OXYCODONE-ACETAMINOPHEN 10-325 MG PO TABS Oral Take 1 tablet by mouth every 6 (six) hours as needed for pain. 30 tablet 0  . POLYETHYLENE GLYCOL 3350 PO  PACK Oral Take 17 g by mouth daily as needed.      Marland Kitchen PROCHLORPERAZINE MALEATE 10 MG PO TABS Oral Take 1 tablet (10 mg total) by mouth every 6 (six) hours as needed. 60 tablet 2    BP 146/85  Pulse 103  Temp 98.8 F (37.1 C) (Oral)  Resp 16  SpO2 100%  Physical Exam  Constitutional: He is oriented to person, place, and time. He appears well-developed and well-nourished. No distress.  HENT:  Head: Normocephalic and atraumatic.  Mouth/Throat: Oropharynx is clear and moist.  Eyes: EOM are normal.  Cardiovascular: Normal rate.   No murmur heard. Pulmonary/Chest: He has no wheezes. He has no rales.  Abdominal: Bowel sounds are normal. There is no tenderness.  Musculoskeletal: He exhibits no edema.  Neurological: He is alert and oriented to person, place, and time. No cranial nerve deficit.  Skin: Skin is warm and  dry. He is not diaphoretic.  Psychiatric: He has a normal mood and affect. His behavior is normal.    ED Course  Procedures   DIAGNOSTIC STUDIES: Oxygen Saturation is 100% on room air, normal by my interpretation.    COORDINATION OF CARE:  11:45 ordered: Hydromorphone (DILAUDID) injection 2mg    Labs Reviewed - No data to display No results found.   1. Pancreatic cancer       MDM  Patient history of a pancreatic CEA ran out of his pain medications last night his followed by [facility] needs renewal on his Percocet and his OxyContin. Patient also treated with 2 mg IM of hydromorphone in the emergency department. States that doesn't have a true allergy that does get some mild itching does have several times.  I personally performed the services described in this documentation, which was scribed in my presence. The recorded information has been reviewed and considered.         Shelda Jakes, MD 12/04/11 [phone]

## 2011-12-06 ENCOUNTER — Ambulatory Visit (HOSPITAL_BASED_OUTPATIENT_CLINIC_OR_DEPARTMENT_OTHER): Payer: Medicaid Other | Admitting: Nurse Practitioner

## 2011-12-06 VITALS — BP 120/82 | HR 69 | Temp 97.5°F | Ht 72.0 in | Wt 150.6 lb

## 2011-12-06 DIAGNOSIS — C259 Malignant neoplasm of pancreas, unspecified: Secondary | ICD-10-CM

## 2011-12-06 NOTE — Progress Notes (Signed)
OFFICE PROGRESS NOTE  Interval history:  Mr. Claytor is a 61 year old man with metastatic pancreas cancer. He is being treated on a clinical trial at [facility]. He reports continued abdominal pain. The pain is "up and down". He is taking OxyContin with Percocet for breakthrough pain. He has periodic nausea. Bowels moving regularly.   Objective: Blood pressure 120/82, pulse 69, temperature 97.5 F (36.4 C), temperature source Oral, height 6' (1.829 m), weight 150 lb 9.6 oz (68.312 kg).  Oropharynx is without thrush or ulceration. Lungs are clear. Regular cardiac rhythm. Port-A-Cath site is without erythema. Abdomen is soft. 2-3 cm right upper abdominal wall mass. Extremities are without edema. Calves soft and nontender.  Lab Results: Lab Results  Component Value Date   WBC 5.0 06/22/2011   HGB 11.6* 06/22/2011   HCT 34.9* 06/22/2011   MCV 84.9 06/22/2011   PLT 416* 06/22/2011    Chemistry:    Chemistry      Component Value Date/Time   NA 135 06/22/2011 0958   K 4.0 06/22/2011 0958   CL 97 06/22/2011 0958   CO2 29 06/22/2011 0958   BUN 7 06/22/2011 0958   CREATININE 0.80 06/22/2011 0958      Component Value Date/Time   CALCIUM 9.3 06/22/2011 0958   ALKPHOS 146* 06/22/2011 0958   AST 31 06/22/2011 0958   ALT 15 06/22/2011 0958   BILITOT 0.2* 06/22/2011 0958       Studies/Results: No results found.  Medications: I have reviewed the patient's current medications.  Assessment/Plan:  1. 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 Histology and immunohistochemical profile were consistent with a poorly-differentiated tumor with neuroendocrine and hepatoid features. The CA19-9 tumor marker was normal at 15.7 on 12/03/2010, and the CEA was normal at 1.7.  a. 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 b. Staging  PET scan confirmed increased FDG activity associated with the right rectus sheath mass and multiple peritoneal metastases.  c. Initiation of gemcitabine chemotherapy on the Onconova study 12/21/2010: He completed day 1 of cycle #2 on 01/18/2011.  d. Restating CT evaluation 01/31/2011 was consistent with disease progression.  e. Initiation of FOLFOX chemotherapy with cycle #1 given on 03/09/2011. He completed cycle 2 on 03/30/2011. He completed cycle 3 on 04/20/2011 with Neulasta support. f. Pelvic CT 05/11/2011 showed multiple lesions around the liver and in the anterior upper abdominal wall had decreased in size; central pelvic lesions had progressed substantially in the interval and were generating prominent mass effect on the rectosigmoid junction. g. Currently being treated on a clinical trial at [facility] with a positive response to therapy. 2. Pain secondary to the rectus sheath mass and pelvic/peritoneal metastases. He continues OxyContin with Percocet as needed. a. He completed palliative radiation to the abdominal rectus sheath mass on 02/21/2011. b. He completed palliative radiation to the pelvis on 06/01/2011 3. 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 4. Remote history of heroin/cocaine use. 5. Ongoing tobacco use. 6. Port-A-Cath placement 01/06/2011. 7. History of neutropenia secondary to chemotherapy: He received Neulasta support after day 15/cycle #1 gemcitabine chemotherapy. Neulasta was added beginning with cycle 3 FOLFOX. 8. Low back pain when here 05/04/2011. Dr. Rayburn Ma felt the source of pain may be due to the large disc herniation at the right side at L4/L5. An epidural steroid injection was done 05/21/2011.  9. Hypertension. Blood pressure overall appears well  controlled. He continues metoprolol. Dr. Truett Perna does not recommend any changes at present.  Disposition-Mr. Peddy appears stable. He continues close followup at [facility]. We will schedule a followup visit here in 3 months. He will  contact the office in the interim with any problems.  Plan reviewed with Dr. Truett Perna.  Lonna Cobb ANP/GNP-BC

## 2011-12-07 ENCOUNTER — Telehealth: Payer: Self-pay | Admitting: *Deleted

## 2011-12-07 NOTE — Telephone Encounter (Signed)
left message to inform the patient of the new date and time on 05-03-2012 starting at 8:00am

## 2011-12-09 ENCOUNTER — Other Ambulatory Visit: Payer: Self-pay | Admitting: *Deleted

## 2011-12-09 DIAGNOSIS — C259 Malignant neoplasm of pancreas, unspecified: Secondary | ICD-10-CM

## 2011-12-09 MED ORDER — OXYCODONE-ACETAMINOPHEN 10-325 MG PO TABS: 1.0000 | ORAL_TABLET | ORAL | Status: DC | PRN

## 2011-12-16 ENCOUNTER — Other Ambulatory Visit: Payer: Self-pay | Admitting: *Deleted

## 2011-12-16 DIAGNOSIS — C259 Malignant neoplasm of pancreas, unspecified: Secondary | ICD-10-CM

## 2011-12-16 MED ORDER — OXYCODONE-ACETAMINOPHEN 10-325 MG PO TABS: 1.0000 | ORAL_TABLET | ORAL | Status: DC | PRN

## 2011-12-23 ENCOUNTER — Telehealth: Payer: Self-pay | Admitting: *Deleted

## 2011-12-23 DIAGNOSIS — C259 Malignant neoplasm of pancreas, unspecified: Secondary | ICD-10-CM

## 2011-12-23 MED ORDER — OXYCODONE-ACETAMINOPHEN 10-325 MG PO TABS: 1.0000 | ORAL_TABLET | ORAL | Status: DC | PRN

## 2011-12-23 MED ORDER — OXYCODONE HCL 20 MG PO TB12: 20.0000 mg | ORAL_TABLET | Freq: Two times a day (BID) | ORAL | Status: DC

## 2011-12-23 NOTE — Telephone Encounter (Signed)
Call from pt requesting refill of Percocet. Also requesting Oxycontin dose to be increased to 30 mg because the 10 mg tablets are ineffective. Reviewed with Misty Stanley, NP. Increase Oxycontin to 20 mg BID.

## 2011-12-30 ENCOUNTER — Other Ambulatory Visit: Payer: Self-pay | Admitting: *Deleted

## 2011-12-30 DIAGNOSIS — C259 Malignant neoplasm of pancreas, unspecified: Secondary | ICD-10-CM

## 2011-12-30 MED ORDER — OXYCODONE-ACETAMINOPHEN 10-325 MG PO TABS: 1.0000 | ORAL_TABLET | ORAL | Status: DC | PRN

## 2012-01-09 ENCOUNTER — Telehealth: Payer: Self-pay | Admitting: *Deleted

## 2012-01-09 DIAGNOSIS — C259 Malignant neoplasm of pancreas, unspecified: Secondary | ICD-10-CM

## 2012-01-09 MED ORDER — OXYCODONE-ACETAMINOPHEN 10-325 MG PO TABS: 1.0000 | ORAL_TABLET | ORAL | Status: DC | PRN

## 2012-01-09 NOTE — Telephone Encounter (Signed)
Pt came in to request his Percocet refill. He called on 01/06/12 but did not leave his full name/ RN was unable to find in system.

## 2012-01-12 ENCOUNTER — Other Ambulatory Visit: Payer: Self-pay | Admitting: *Deleted

## 2012-01-12 DIAGNOSIS — C259 Malignant neoplasm of pancreas, unspecified: Secondary | ICD-10-CM

## 2012-01-12 MED ORDER — OXYCODONE-ACETAMINOPHEN 10-325 MG PO TABS: 1.0000 | ORAL_TABLET | ORAL | Status: DC | PRN

## 2012-01-12 NOTE — Telephone Encounter (Signed)
Pt called today for refill on his oxycontin & percocet.  Informed pt that percocet would be ready tomorrow but not time for the oxycontin.  He states that he is taking maybe one q 8 hours when he has pain.  Verified with Dr Truett Perna & informed pt that he needed to stick with the prescribed dose & would have to wait on this prescription.  He was not happy.

## 2012-01-13 ENCOUNTER — Emergency Department (HOSPITAL_COMMUNITY)
Admission: EM | Admit: 2012-01-13 | Discharge: 2012-01-13 | Disposition: A | Discharge status: Home / Self Care | Payer: Medicaid Other | Attending: Emergency Medicine | Admitting: Emergency Medicine

## 2012-01-13 ENCOUNTER — Encounter (HOSPITAL_COMMUNITY): Payer: Self-pay | Admitting: *Deleted

## 2012-01-13 DIAGNOSIS — M1990 Unspecified osteoarthritis, unspecified site: Secondary | ICD-10-CM | POA: Insufficient documentation

## 2012-01-13 DIAGNOSIS — Z8509 Personal history of malignant neoplasm of other digestive organs: Secondary | ICD-10-CM | POA: Insufficient documentation

## 2012-01-13 DIAGNOSIS — I10 Essential (primary) hypertension: Secondary | ICD-10-CM | POA: Insufficient documentation

## 2012-01-13 DIAGNOSIS — G8929 Other chronic pain: Secondary | ICD-10-CM | POA: Insufficient documentation

## 2012-01-13 DIAGNOSIS — F17200 Nicotine dependence, unspecified, uncomplicated: Secondary | ICD-10-CM | POA: Insufficient documentation

## 2012-01-13 DIAGNOSIS — R109 Unspecified abdominal pain: Secondary | ICD-10-CM | POA: Insufficient documentation

## 2012-01-13 MED ORDER — OXYCODONE-ACETAMINOPHEN 10-325 MG PO TABS
1.0000 | ORAL_TABLET | ORAL | Status: DC | PRN
Start: 2012-01-13 — End: 2012-01-23

## 2012-01-13 MED ORDER — HYDROMORPHONE HCL PF 2 MG/ML IJ SOLN
2.0000 mg | Freq: Once | INTRAMUSCULAR | Status: AC
  Administered 2012-01-13: 2 mg via INTRAMUSCULAR
  Filled 2012-01-13: qty 1

## 2012-01-13 NOTE — ED Notes (Signed)
The pt is refusing labd draw he says his blood was drawn  2 days ago.  He is out of percocet he take that for pain

## 2012-01-13 NOTE — ED Notes (Signed)
Pt states that he is here for abdominal pain. Pt states that he has pancreatic cancer and he has times when he is in severe pain. Pt is being treated at [facility]. Pt states that he has regular Chemo and lab work at [facility] and does not want anything here besides pain medication. Pt states he normally gets a shot of dilaudid and then a rx for pain medication until he goes back to [facility]. Pt states that he knows his lipase and platelet count. Pt denied blood work here and does not want testing here. Pt states that he will see his MD at [facility] this week and can be admitted there if need be where he can receive chemo while in hospital.

## 2012-01-13 NOTE — ED Notes (Signed)
The pt has abd pain he has pancreatic cancer.  He has been out of pain med for 2 days.  He is getting chemo at [facility]..  He has a porta cath

## 2012-01-13 NOTE — ED Provider Notes (Signed)
History     CSN: [id number]  Arrival date & time 01/13/12  2207   First MD Initiated Contact with Patient 01/13/12 2305      Chief Complaint  Patient presents with  . Abdominal Pain    (Consider location/radiation/quality/duration/timing/severity/associated sxs/prior treatment) HPI Comments: 61 year old male with a history of pancreatic cancer currently undergoing his fifth episode of chemotherapy at [facility] who presents with complaint of abdominal pain which is located in the right upper quadrant and epigastrium, constant, gradually worsening and not associated with fevers chills nausea or vomiting. He states this is similar to his prior abdominal pain, he has run out of his pain medication and declines further workup requesting only pain medication.  Patient is a 61 y.o. male presenting with abdominal pain. The history is provided by the patient and medical records.  Abdominal Pain The primary symptoms of the illness include abdominal pain. The primary symptoms of the illness do not include fever, nausea or vomiting.  Symptoms associated with the illness do not include chills.    Past Medical History  Diagnosis Date  . Diarrhea   . Nausea   . Rash     on abdomen from radiation   . Chronic back pain     r/t disc disease--w/bilateral leg pain  . History of drug abuse     Remote--heroin & cocaine  . 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 Hypertension   . Osteoarthritis   . Pancreatitis   . 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 Hx of radiation therapy 02/08/11 to 02/21/11    palliative- RUQ abd, R lateral hepatic mets  . Pancreatic cancer 03/2010    poorly differentiated adenocarcinoma  . Hx of radiation therapy 05/13/11 to 06/01/11    pelvis    Past Surgical History  Procedure Date  . Whipple procedure 02/08/2010  . Portacath placement 01/06/11    8/15/12tip in superior cavoatrial junction  . Hernia repair     inguinal hernia  repair as a child  . Dx laparoscopy 01/13/2011  . Right total hip replacement 08/14/2010  . Hydrocele excision / repair     Family History  Problem Relation Age of Onset  . Cancer Mother   . Cancer Sister   . Cancer Sister     History  Substance Use Topics  . Smoking status: Current Everyday Smoker -- 0.5 packs/day for 30 years    Types: Cigarettes  . Smokeless tobacco: Never Used  . Alcohol Use: No     occasional basis      Review of Systems  Constitutional: Negative for fever and chills.  Gastrointestinal: Positive for abdominal pain. Negative for nausea and vomiting.  Skin: Negative for rash.    Allergies  Hydromorphone and Demerol  Home Medications   Current Outpatient Rx  Name Route Sig Dispense Refill  . METOPROLOL TARTRATE 25 MG PO TABS Oral Take 1 tablet (25 mg total) by mouth daily. 30 tablet 3  . POLYETHYLENE GLYCOL 3350 PO PACK Oral Take 17 g by mouth daily as needed. For constipation    . PROCHLORPERAZINE MALEATE 10 MG PO TABS Oral Take 10 mg by mouth every 6 (six) hours as needed. For nausea    . OXYCODONE HCL ER 20 MG PO TB12 Oral Take 1 tablet (20 mg total) by mouth every 12 (twelve) hours. 60 tablet 0  . OXYCODONE-ACETAMINOPHEN 10-325 MG PO TABS Oral Take 1 tablet by mouth every 4 (four) hours as needed  for pain (severe pain). 42 tablet 0  . OXYCODONE-ACETAMINOPHEN 10-325 MG PO TABS Oral Take 1 tablet by mouth every 4 (four) hours as needed for pain. 30 tablet 0    BP 152/90  Pulse 102  Temp 98.8 F (37.1 C) (Oral)  Resp 22  SpO2 97%  Physical Exam  Nursing note and vitals reviewed. Constitutional: He appears well-developed and well-nourished. No distress.  HENT:  Head: Normocephalic and atraumatic.  Mouth/Throat: No oropharyngeal exudate.  Eyes: Conjunctivae are normal. No scleral icterus.  Cardiovascular: Normal rate, regular rhythm, normal heart sounds and intact distal pulses.  Exam reveals no gallop and no friction rub.   No murmur  heard. Pulmonary/Chest: Effort normal and breath sounds normal. No respiratory distress. He has no wheezes. He has no rales.  Abdominal: Soft. Bowel sounds are normal. He exhibits no distension and no mass. There is tenderness ( RUQ and epigastric ttp).  Musculoskeletal: Normal range of motion. He exhibits no edema and no tenderness.  Neurological: He is alert. Coordination normal.  Skin: Skin is warm and dry. No rash noted. No erythema.  Psychiatric: He has a normal mood and affect. His behavior is normal.    ED Course  Procedures (including critical care time)   Labs Reviewed  CBC WITH DIFFERENTIAL  COMPREHENSIVE METABOLIC PANEL   No results found.   1. Abdominal pain       MDM  Vital signs are overall unremarkable, the patient has tenderness that is non-peritoneal and declines further workup including blood work. At this time I feel it reasonable to treat his advanced pancreatic carcinoma with pain medications, hydromorphone intramuscular ordered and Percocet 10 mg tablets prescribed until he can followup.        Vida Roller, MD 01/13/12 2312

## 2012-01-13 NOTE — ED Notes (Signed)
His blood was drawn in ch

## 2012-01-19 ENCOUNTER — Telehealth: Payer: Self-pay | Admitting: *Deleted

## 2012-01-19 NOTE — Telephone Encounter (Signed)
Message from pt requesting to pick up Percocet rx on 01/20/12. Pt received last rx in the office on 8/29 and #30 tabs in the ED the following day. Per Dr. Truett Perna: too soon to refill. Will refill on 01/23/12 when Oxycontin rx is due. Attempted to call pt with this information. Left message on voicemail.

## 2012-01-23 ENCOUNTER — Other Ambulatory Visit: Payer: Self-pay | Admitting: *Deleted

## 2012-01-23 DIAGNOSIS — C259 Malignant neoplasm of pancreas, unspecified: Secondary | ICD-10-CM

## 2012-01-23 MED ORDER — OXYCODONE HCL 20 MG PO TB12: 20.0000 mg | ORAL_TABLET | Freq: Two times a day (BID) | ORAL | Status: DC

## 2012-01-23 MED ORDER — OXYCODONE-ACETAMINOPHEN 10-325 MG PO TABS: 1.0000 | ORAL_TABLET | ORAL | Status: DC | PRN

## 2012-01-26 ENCOUNTER — Other Ambulatory Visit: Payer: Self-pay | Admitting: *Deleted

## 2012-01-26 NOTE — Telephone Encounter (Signed)
Pt called earlier today asking if there is anything he can do to increase his platelets & another call to refill his percocet.  Multiple attempts made to call pt & line is busy.

## 2012-01-27 ENCOUNTER — Other Ambulatory Visit: Payer: Self-pay | Admitting: *Deleted

## 2012-01-27 ENCOUNTER — Other Ambulatory Visit (HOSPITAL_BASED_OUTPATIENT_CLINIC_OR_DEPARTMENT_OTHER): Payer: Medicaid Other | Admitting: Lab

## 2012-01-27 ENCOUNTER — Other Ambulatory Visit: Payer: Self-pay | Admitting: Nurse Practitioner

## 2012-01-27 ENCOUNTER — Telehealth: Payer: Self-pay | Admitting: *Deleted

## 2012-01-27 DIAGNOSIS — C259 Malignant neoplasm of pancreas, unspecified: Secondary | ICD-10-CM

## 2012-01-27 LAB — CBC WITH DIFFERENTIAL/PLATELET
Eosinophils Absolute: 0 10*3/uL (ref 0.0–0.5)
HCT: 27.2 % — ABNORMAL LOW (ref 38.4–49.9)
LYMPH%: 20.4 % (ref 14.0–49.0)
MONO#: 0.3 10*3/uL (ref 0.1–0.9)
NEUT#: 2.2 10*3/uL (ref 1.5–6.5)
NEUT%: 68.8 % (ref 39.0–75.0)
Platelets: 19 10*3/uL — ABNORMAL LOW (ref 140–400)
WBC: 3.2 10*3/uL — ABNORMAL LOW (ref 4.0–10.3)

## 2012-01-27 MED ORDER — OXYCODONE-ACETAMINOPHEN 10-325 MG PO TABS: 1.0000 | ORAL_TABLET | ORAL | Status: DC | PRN

## 2012-01-27 NOTE — Telephone Encounter (Signed)
Called and spoke with pt regarding low platelet count of 19.  Pt is aware and voiced back understanding of precautions to take and if major bleeding to go to ED.  Per Dr. Truett Perna, instructed pt that scheduler will call to make lab appt for Monday.  Pt verbalized understanding of instructions.

## 2012-01-30 ENCOUNTER — Telehealth: Payer: Self-pay | Admitting: *Deleted

## 2012-01-30 ENCOUNTER — Other Ambulatory Visit (HOSPITAL_BASED_OUTPATIENT_CLINIC_OR_DEPARTMENT_OTHER): Payer: Medicaid Other | Admitting: Lab

## 2012-01-30 DIAGNOSIS — C259 Malignant neoplasm of pancreas, unspecified: Secondary | ICD-10-CM

## 2012-01-30 LAB — CBC WITH DIFFERENTIAL/PLATELET
Basophils Absolute: 0 10*3/uL (ref 0.0–0.1)
EOS%: 0.5 % (ref 0.0–7.0)
HCT: 29.4 % — ABNORMAL LOW (ref 38.4–49.9)
HGB: 9.8 g/dL — ABNORMAL LOW (ref 13.0–17.1)
MCH: 34.8 pg — ABNORMAL HIGH (ref 27.2–33.4)
MCV: 104.4 fL — ABNORMAL HIGH (ref 79.3–98.0)
NEUT%: 57.5 % (ref 39.0–75.0)
lymph#: 0.7 10*3/uL — ABNORMAL LOW (ref 0.9–3.3)

## 2012-01-30 NOTE — Telephone Encounter (Signed)
MD review of CBC results. Call for obvious bleeding or bruising. Recheck counts later this week at [facility] if not going to [facility]. Patient notified and he reports he is going to [facility] on 02/01/12. Copy of labs faxed to Dr. Victoriano Lain with note asking if they desire recheck in [place] next week?

## 2012-02-03 ENCOUNTER — Other Ambulatory Visit: Payer: Self-pay | Admitting: *Deleted

## 2012-02-03 ENCOUNTER — Telehealth: Payer: Self-pay | Admitting: *Deleted

## 2012-02-03 DIAGNOSIS — C259 Malignant neoplasm of pancreas, unspecified: Secondary | ICD-10-CM

## 2012-02-03 MED ORDER — OXYCODONE-ACETAMINOPHEN 10-325 MG PO TABS: 1.0000 | ORAL_TABLET | ORAL | Status: DC | PRN

## 2012-02-03 NOTE — Telephone Encounter (Signed)
Call from pt reporting his PLT was 44 at [facility] today. He was told to have his stool checked for blood and to repeat a CBC in this office next week. Pt will pick up stool cards on 02/06/12. Requests to check lab on 02/08/12. Pt understands to see schedulers on 9/23 for appt.

## 2012-02-08 ENCOUNTER — Other Ambulatory Visit (HOSPITAL_BASED_OUTPATIENT_CLINIC_OR_DEPARTMENT_OTHER): Payer: Medicaid Other | Admitting: Lab

## 2012-02-08 DIAGNOSIS — C259 Malignant neoplasm of pancreas, unspecified: Secondary | ICD-10-CM

## 2012-02-08 LAB — CBC WITH DIFFERENTIAL/PLATELET
Basophils Absolute: 0 10*3/uL (ref 0.0–0.1)
EOS%: 0.4 % (ref 0.0–7.0)
HGB: 10.1 g/dL — ABNORMAL LOW (ref 13.0–17.1)
LYMPH%: 12.8 % — ABNORMAL LOW (ref 14.0–49.0)
MCH: 35.4 pg — ABNORMAL HIGH (ref 27.2–33.4)
MCV: 105.9 fL — ABNORMAL HIGH (ref 79.3–98.0)
MONO%: 16.5 % — ABNORMAL HIGH (ref 0.0–14.0)
NEUT%: 69.9 % (ref 39.0–75.0)
Platelets: 58 10*3/uL — ABNORMAL LOW (ref 140–400)
RDW: 15.1 % — ABNORMAL HIGH (ref 11.0–14.6)

## 2012-02-09 ENCOUNTER — Other Ambulatory Visit: Payer: Self-pay | Admitting: *Deleted

## 2012-02-09 DIAGNOSIS — C259 Malignant neoplasm of pancreas, unspecified: Secondary | ICD-10-CM

## 2012-02-09 MED ORDER — OXYCODONE-ACETAMINOPHEN 10-325 MG PO TABS: 1.0000 | ORAL_TABLET | ORAL | Status: DC | PRN

## 2012-02-09 NOTE — Telephone Encounter (Signed)
Voice mail requesting to pick up Percocet refill Friday.

## 2012-02-16 ENCOUNTER — Other Ambulatory Visit: Payer: Self-pay | Admitting: *Deleted

## 2012-02-16 DIAGNOSIS — C259 Malignant neoplasm of pancreas, unspecified: Secondary | ICD-10-CM

## 2012-02-16 MED ORDER — OXYCODONE-ACETAMINOPHEN 10-325 MG PO TABS: 1.0000 | ORAL_TABLET | ORAL | Status: DC | PRN

## 2012-02-23 ENCOUNTER — Encounter (HOSPITAL_COMMUNITY): Payer: Self-pay | Admitting: *Deleted

## 2012-02-23 ENCOUNTER — Other Ambulatory Visit: Payer: Self-pay | Admitting: *Deleted

## 2012-02-23 ENCOUNTER — Telehealth: Payer: Self-pay | Admitting: *Deleted

## 2012-02-23 ENCOUNTER — Observation Stay (HOSPITAL_COMMUNITY): Payer: Medicaid Other

## 2012-02-23 ENCOUNTER — Emergency Department (HOSPITAL_COMMUNITY): Payer: Medicaid Other

## 2012-02-23 ENCOUNTER — Observation Stay (HOSPITAL_COMMUNITY)
Admission: EM | Admit: 2012-02-23 | Discharge: 2012-02-24 | Disposition: A | Discharge status: Home / Self Care | Payer: Medicaid Other | Attending: Internal Medicine | Admitting: Internal Medicine

## 2012-02-23 DIAGNOSIS — K5900 Constipation, unspecified: Secondary | ICD-10-CM

## 2012-02-23 DIAGNOSIS — R5381 Other malaise: Secondary | ICD-10-CM | POA: Insufficient documentation

## 2012-02-23 DIAGNOSIS — C259 Malignant neoplasm of pancreas, unspecified: Secondary | ICD-10-CM

## 2012-02-23 DIAGNOSIS — E860 Dehydration: Secondary | ICD-10-CM | POA: Insufficient documentation

## 2012-02-23 DIAGNOSIS — R109 Unspecified abdominal pain: Principal | ICD-10-CM | POA: Insufficient documentation

## 2012-02-23 DIAGNOSIS — Z923 Personal history of irradiation: Secondary | ICD-10-CM | POA: Insufficient documentation

## 2012-02-23 DIAGNOSIS — G8929 Other chronic pain: Secondary | ICD-10-CM | POA: Insufficient documentation

## 2012-02-23 DIAGNOSIS — I10 Essential (primary) hypertension: Secondary | ICD-10-CM | POA: Insufficient documentation

## 2012-02-23 DIAGNOSIS — D696 Thrombocytopenia, unspecified: Secondary | ICD-10-CM | POA: Insufficient documentation

## 2012-02-23 DIAGNOSIS — R112 Nausea with vomiting, unspecified: Secondary | ICD-10-CM | POA: Insufficient documentation

## 2012-02-23 DIAGNOSIS — K921 Melena: Secondary | ICD-10-CM | POA: Insufficient documentation

## 2012-02-23 DIAGNOSIS — R5383 Other fatigue: Secondary | ICD-10-CM | POA: Insufficient documentation

## 2012-02-23 DIAGNOSIS — Z79899 Other long term (current) drug therapy: Secondary | ICD-10-CM | POA: Insufficient documentation

## 2012-02-23 DIAGNOSIS — D649 Anemia, unspecified: Secondary | ICD-10-CM | POA: Insufficient documentation

## 2012-02-23 DIAGNOSIS — R509 Fever, unspecified: Secondary | ICD-10-CM | POA: Insufficient documentation

## 2012-02-23 DIAGNOSIS — C787 Secondary malignant neoplasm of liver and intrahepatic bile duct: Secondary | ICD-10-CM | POA: Insufficient documentation

## 2012-02-23 LAB — CBC WITH DIFFERENTIAL/PLATELET
Basophils Absolute: 0 10*3/uL (ref 0.0–0.1)
Eosinophils Absolute: 0 10*3/uL (ref 0.0–0.7)
Lymphs Abs: 1.1 10*3/uL (ref 0.7–4.0)
MCH: 34.3 pg — ABNORMAL HIGH (ref 26.0–34.0)
MCHC: 34.6 g/dL (ref 30.0–36.0)
Monocytes Absolute: 0.4 10*3/uL (ref 0.1–1.0)
Neutrophils Relative %: 61 % (ref 43–77)
Platelets: 78 10*3/uL — ABNORMAL LOW (ref 150–400)
RBC: 2.74 MIL/uL — ABNORMAL LOW (ref 4.22–5.81)
RDW: 13.4 % (ref 11.5–15.5)

## 2012-02-23 LAB — URINALYSIS, ROUTINE W REFLEX MICROSCOPIC
Glucose, UA: NEGATIVE mg/dL
Hgb urine dipstick: NEGATIVE
Leukocytes, UA: NEGATIVE
Protein, ur: NEGATIVE mg/dL
Specific Gravity, Urine: 1.015 (ref 1.005–1.030)
Urobilinogen, UA: 1 mg/dL (ref 0.0–1.0)

## 2012-02-23 LAB — COMPREHENSIVE METABOLIC PANEL
ALT: 11 U/L (ref 0–53)
AST: 20 U/L (ref 0–37)
Calcium: 8.4 mg/dL (ref 8.4–10.5)
Creatinine, Ser: 0.69 mg/dL (ref 0.50–1.35)
Sodium: 137 mEq/L (ref 135–145)
Total Protein: 6.7 g/dL (ref 6.0–8.3)

## 2012-02-23 MED ORDER — ACETAMINOPHEN 325 MG PO TABS: 650.0000 mg | ORAL_TABLET | Freq: Four times a day (QID) | ORAL | Status: DC | PRN

## 2012-02-23 MED ORDER — HYDROMORPHONE HCL PF 2 MG/ML IJ SOLN
2.0000 mg | Freq: Once | INTRAMUSCULAR | Status: AC
  Administered 2012-02-23: 2 mg via INTRAVENOUS
  Filled 2012-02-23: qty 1

## 2012-02-23 MED ORDER — METOPROLOL TARTRATE 25 MG PO TABS
25.0000 mg | ORAL_TABLET | Freq: Every day | ORAL | Status: DC
  Administered 2012-02-23 – 2012-02-24 (×2): 25 mg via ORAL
  Filled 2012-02-23 (×2): qty 1

## 2012-02-23 MED ORDER — SODIUM CHLORIDE 0.9 % IV SOLN
INTRAVENOUS | Status: DC
  Administered 2012-02-23: 18:00:00 via INTRAVENOUS

## 2012-02-23 MED ORDER — ONDANSETRON HCL 4 MG/2ML IJ SOLN: 4.0000 mg | Freq: Four times a day (QID) | INTRAMUSCULAR | Status: DC | PRN

## 2012-02-23 MED ORDER — ONDANSETRON HCL 4 MG PO TABS: 4.0000 mg | ORAL_TABLET | Freq: Four times a day (QID) | ORAL | Status: DC | PRN

## 2012-02-23 MED ORDER — OXYCODONE HCL 20 MG PO TB12: 20.0000 mg | ORAL_TABLET | Freq: Two times a day (BID) | ORAL | Status: DC

## 2012-02-23 MED ORDER — OXYCODONE-ACETAMINOPHEN 10-325 MG PO TABS: 1.0000 | ORAL_TABLET | ORAL | Status: DC | PRN

## 2012-02-23 MED ORDER — POLYETHYLENE GLYCOL 3350 17 G PO PACK
17.0000 g | PACK | Freq: Every day | ORAL | Status: DC | PRN
  Filled 2012-02-23: qty 1

## 2012-02-23 MED ORDER — FENTANYL CITRATE 0.05 MG/ML IJ SOLN: 50.0000 ug | Freq: Once | INTRAMUSCULAR | Status: DC

## 2012-02-23 MED ORDER — OXYCODONE-ACETAMINOPHEN 5-325 MG PO TABS: 1.0000 | ORAL_TABLET | ORAL | Status: DC | PRN

## 2012-02-23 MED ORDER — FENTANYL CITRATE 0.05 MG/ML IJ SOLN
INTRAMUSCULAR | Status: AC
  Administered 2012-02-23: 50 ug
  Filled 2012-02-23: qty 2

## 2012-02-23 MED ORDER — OXYCODONE HCL 5 MG PO TABS: 5.0000 mg | ORAL_TABLET | ORAL | Status: DC | PRN

## 2012-02-23 MED ORDER — FENTANYL CITRATE 0.05 MG/ML IJ SOLN
200.0000 ug | Freq: Once | INTRAMUSCULAR | Status: AC
  Administered 2012-02-23: 200 ug via INTRAVENOUS
  Filled 2012-02-23: qty 4

## 2012-02-23 MED ORDER — SODIUM CHLORIDE 0.9 % IV SOLN: INTRAVENOUS | Status: DC

## 2012-02-23 MED ORDER — OXYCODONE-ACETAMINOPHEN 10-325 MG PO TABS
1.0000 | ORAL_TABLET | ORAL | Status: DC | PRN
  Filled 2012-02-23: qty 1

## 2012-02-23 MED ORDER — OXYCODONE HCL 20 MG PO TB12
20.0000 mg | ORAL_TABLET | Freq: Two times a day (BID) | ORAL | Status: DC
  Administered 2012-02-23 – 2012-02-24 (×2): 20 mg via ORAL
  Filled 2012-02-23: qty 1
  Filled 2012-02-23: qty 2

## 2012-02-23 MED ORDER — ACETAMINOPHEN 650 MG RE SUPP: 650.0000 mg | Freq: Four times a day (QID) | RECTAL | Status: DC | PRN

## 2012-02-23 MED ORDER — ONDANSETRON HCL 4 MG/2ML IJ SOLN
4.0000 mg | Freq: Once | INTRAMUSCULAR | Status: AC
  Administered 2012-02-23: 4 mg via INTRAVENOUS
  Filled 2012-02-23: qty 2

## 2012-02-23 MED ORDER — PANTOPRAZOLE SODIUM 40 MG PO TBEC
40.0000 mg | DELAYED_RELEASE_TABLET | Freq: Every day | ORAL | Status: DC
  Administered 2012-02-24: 40 mg via ORAL
  Filled 2012-02-23: qty 1

## 2012-02-23 MED ORDER — HYDROMORPHONE HCL PF 2 MG/ML IJ SOLN
2.0000 mg | INTRAMUSCULAR | Status: DC | PRN
  Administered 2012-02-23 – 2012-02-24 (×6): 2 mg via INTRAVENOUS
  Filled 2012-02-23 (×6): qty 1

## 2012-02-23 MED ORDER — HYDROMORPHONE HCL PF 2 MG/ML IJ SOLN: 2.0000 mg | INTRAMUSCULAR | Status: DC | PRN

## 2012-02-23 MED ORDER — IOHEXOL 300 MG/ML  SOLN
100.0000 mL | Freq: Once | INTRAMUSCULAR | Status: AC | PRN
  Administered 2012-02-23: 100 mL via INTRAVENOUS

## 2012-02-23 NOTE — H&P (Signed)
[facility] History and Physical  [name] AVW:[id number] DOB: 1950-07-16 DOA: 02/23/2012  Referring physician: Dr. Rubin Payor PCP: Sheila Oats, MD  Specialists: Dr. Truett Perna his oncologist  Chief Complaint: nausea, vomiting, abdominal pain  HPI: [name] is a 61 y.o. 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 Patient endorses that he had been also experiencing some fever and increased weakness, he denies any diarrhea, chest pain or shortness of breath. Patient endorses seen some blood on his stool and reported his last chemotherapy treatment at [facility] was about 2-3 weeks ago. Patient reports that his symptoms are, and after the chemotherapy agents are given and that he was actively trying to avoid coming to the hospital but he feels so bad and was at this moment no having any further pain medications to take all to his next followup at the cancer Center on 02/25/2012. Workup in the ED demonstrated normal abdominal series, no neutropenia, hemoglobin at baseline, normal electrolytes and renal function. Due to the ongoing pain and mild dehydration [facility] has been called to admit the patient for observation in order to provide further treatment of his condition.   A CT of the abdomen was order in the major department but pending at the moment of this dictation.  Review of Systems:  Negative except as otherwise mentioned on history of present illness.   Past Medical History  Diagnosis Date  . Diarrhea   . Nausea   . Rash     on abdomen from radiation   . Chronic back pain     r/t disc disease--w/bilateral leg pain  . History of drug abuse     Remote--heroin & cocaine  . 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  Hypertension   . Osteoarthritis   . Pancreatitis   . 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 Hx of radiation therapy 02/08/11 to 02/21/11    palliative- RUQ abd, R lateral hepatic mets  . Pancreatic cancer 03/2010    poorly differentiated adenocarcinoma  . Hx of radiation therapy 05/13/11 to 06/01/11    pelvis   Past Surgical History  Procedure Date  . Whipple procedure 02/08/2010  . Portacath placement 01/06/11    8/15/12tip in superior cavoatrial junction  . Hernia repair     inguinal hernia repair as a child  . Dx laparoscopy 01/13/2011  . Right total hip replacement 08/14/2010  . Hydrocele excision / repair    Social History:  reports that he has been smoking Cigarettes.  He has a 15 pack-year smoking history. He has never used smokeless tobacco. He reports that he does not drink alcohol or use illicit drugs. Came from home and do not require any assistance with activities of daily living at this point.   Allergies  Allergen Reactions  . No Known Allergies     Family History  Problem Relation Age of Onset  . Cancer Mother   . Cancer Sister   . Cancer Sister     Prior to Admission medications   Medication Sig Start Date End Date Taking? Authorizing Provider  metoprolol tartrate (LOPRESSOR) 25 MG tablet Take 1 tablet (25 mg total) by mouth daily. 10/26/11  Yes Rana Snare, NP  polyethylene glycol (MIRALAX / GLYCOLAX) packet Take 17 g by mouth daily as needed. For constipation  Yes Historical Provider, MD  prochlorperazine (COMPAZINE) 10 MG tablet Take 10 mg by mouth every 6 (six) hours as needed. For nausea   Yes Historical Provider, MD  oxyCODONE (OXYCONTIN) 20 MG 12 hr tablet Take 1 tablet (20 mg total) by mouth every 12 (twelve) hours. 07/05/11 07/19/11  Ladene Artist, MD  oxyCODONE (OXYCONTIN) 20 MG 12 hr tablet Take 1 tablet (20 mg total) by mouth every 12 (twelve) hours. 02/23/12   Ladene Artist, MD  oxyCODONE-acetaminophen (PERCOCET) 10-325 MG per tablet  Take 1 tablet by mouth every 4 (four) hours as needed for pain (severe pain). 02/23/12   Ladene Artist, MD   Physical Exam: Filed Vitals:   02/23/12 1100  BP: 159/82  Pulse: 83  Temp: 98.3 F (36.8 C)  TempSrc: Oral  Resp: 16  SpO2: 100%     General:  No acute distress, afebrile, cooperative to examination.  Eyes: No icterus, no nystagmus, PERRLA, extra ocular muscles intact.  ENT: Dry mucous membranes, no erythema or exudates inside his mouth; no tympanic membranes bulging or any discharges out of his nostrils  Neck: No JVD, no thyromegaly. Mild enlarged ??Lipoma on the left side of his neck  Cardiovascular: S1 and S2 appreciated, no rubs or gallops.  Respiratory: Clear to auscultation bilaterally  Abdomen: mid epigastric tenderness with palpation, no guarding, no distention, positive bowel sounds.  Skin: no rash or petechiae  Musculoskeletal: no joint swelling or erythema  Psychiatric: stable and appropriate.  Neurologic: no focal deficit appreciated, cranial nerve intact, muscle strength 4-5 out of 5 secondary to poor effort; normal finger to nose.  Labs on Admission:  Basic Metabolic Panel:  Lab 02/23/12 1610  NA 137  K 3.2*  CL 102  CO2 25  GLUCOSE 81  BUN 7  CREATININE 0.69  CALCIUM 8.4  MG --  PHOS --   Liver Function Tests:  Lab 02/23/12 1150  AST 20  ALT 11  ALKPHOS 127*  BILITOT 0.2*  PROT 6.7  ALBUMIN 2.8*    Lab 02/23/12 1150  LIPASE 30  AMYLASE --   CBC:  Lab 02/23/12 1150  WBC 4.0  NEUTROABS 2.5  HGB 9.4*  HCT 27.2*  MCV 99.3  PLT 78*    Radiological Exams on Admission: Dg Abd Acute W/chest  02/23/2012  *RADIOLOGY REPORT*  Clinical Data: Abdominal pain, nausea/vomiting, history of pancreatic cancer  ACUTE ABDOMEN SERIES (ABDOMEN 2 VIEW & CHEST 1 VIEW)  Comparison: CT abdomen pelvis dated 05/11/2011.  CT chest abdomen pelvis dated 01/31/2011.  Findings: Lungs are essentially clear. No pleural effusion or pneumothorax.   Cardiomediastinal silhouette is within normal limits.  Right chest power port.  Nonspecific bowel gas pattern without disproportionate small bowel dilatation to suggest small bowel obstruction.  Moderate stool in the colon.  Surgical sutures in the left upper abdomen.  No evidence of free air under the diaphragm on the upright view.  Right total hip arthroplasty.  IMPRESSION: No evidence of acute cardiopulmonary disease.  No evidence of small bowel obstruction or free air.  Moderate stool in the colon.   Original Report Authenticated By: Charline Bills, M.D.    Assessment/Plan 1-Pancreatic cancer, s/p whipple 02/09/2010, T2N0: Patient receiving chemotherapy at [facility] 4 weeks on and 3 weeks off, he is also follow by Dr. Truett Perna at the cancer center where he received his prescription. Patient last chemotherapy given about 2-3 weeks ago and per patient reports his symptoms are common after tx. At this point will contact  the oncology team about patient admission, but doubt any inpatient treatment will be required.  2-Abdominal pain: acute on chronic; perhaps precipitated for ongoing chemotherapy treatment vs the fact he ran out of his pain meds a little earlier this month and was already on pain to severe to control it at home. Will follow CT of abdomen ordered in Ed to evaluate any further progression or worsening of his cancer. Will start supportive care with IVF's, PRN pain meds and antiemetics and will follow clinically response. No UTI on UA, no abnormalities on acute abdominal series. Patient endorses some reflux symptoms as well; will start him on protonix.  3-Constipation: due to chronic narcotics, will continue miralax.  4-Hypertension: stable; will continue metoprolol  5-Fever: patient is not neutropenic and at this moment no source of infection identified; he is also not febrile at this point; will follow blood and urine cx's; but will hold on abx's at this moment.  4-Blood in stool: might be  associated to chemotherapy; vs some hemorrhoids now bleeding some due to constipation. Will follow Hgb trend; check FOBT and started on PPI  5-DVT: SCD's   Code Status: Full Family Communication: no family at bedside Disposition Plan: home at the moment of discharge, most likely  Tomorrow  time spent: >30 minutes  Rita Prom [facility] Pager [phone]  If 7PM-7AM, please contact night-coverage [email or web address] Password [email or web address] 02/23/2012, 5:01 PM

## 2012-02-23 NOTE — ED Notes (Signed)
Pt reports RUQ pain with n/v x 2 days.  Reports severe h/a last night and diaphoresis.  Pt reports last chemo was at [facility] x 3 or 4 weeks ago.

## 2012-02-23 NOTE — ED Notes (Signed)
Patient is aware that a urine sample is needed. Patient has urinal and sample cup at bedside. Will check back

## 2012-02-23 NOTE — Telephone Encounter (Signed)
02/24/11 at 10:03am The pt called the research nurse with several medical problems.  He reported that he has a fever, his abdomen is swollen and painful, and he has blood in his rectum.  The pt called wanting to know if Dr. Truett Perna wants to see him in the office or should he go to the ED for evaluation.   The research nurse immediately spoke to Dr. Truett Perna about the pt's reported problems.  Dr. Truett Perna said that he should go to the ED.  Dr. Truett Perna said he would have to wait a long time to be seen if he came to the cancer center now.  The research nurse called the pt back and informed him that Dr. Truett Perna advised him to go to the [facility] ED now for evaluation.   The pt verbalized understanding.

## 2012-02-23 NOTE — ED Provider Notes (Signed)
History     CSN: [id number]  Arrival date & time 02/23/12  1055   First MD Initiated Contact with Patient 02/23/12 1127      Chief Complaint  Patient presents with  . Abdominal Pain    n/v    (Consider location/radiation/quality/duration/timing/severity/associated sxs/prior treatment) Patient is a 61 y.o. male presenting with abdominal pain. The history is provided by the patient.  Abdominal Pain The primary symptoms of the illness include abdominal pain, fever, fatigue, nausea and vomiting. The primary symptoms of the illness do not include shortness of breath or diarrhea.  Symptoms associated with the illness do not include back pain.   patient presents with nausea vomiting abdominal pain. He has metastatic pancreatic cancer  [employer] in [place]. Last chemotherapy was 3-4 weeks ago. He states his been having fevers. No diarrhea. No cough. Patient states he feels as if he needs to come in to the hospital. Past Medical History  Diagnosis Date  . Diarrhea   . Nausea   . Rash     on abdomen from radiation   . Chronic back pain     r/t disc disease--w/bilateral leg pain  . History of drug abuse     Remote--heroin & cocaine  . 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 Hypertension   . Osteoarthritis   . Pancreatitis   . 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 Hx of radiation therapy 02/08/11 to 02/21/11    palliative- RUQ abd, R lateral hepatic mets  . Pancreatic cancer 03/2010    poorly differentiated adenocarcinoma  . Hx of radiation therapy 05/13/11 to 06/01/11    pelvis    Past Surgical History  Procedure Date  . Whipple procedure 02/08/2010  . Portacath placement 01/06/11    8/15/12tip in superior cavoatrial junction  . Hernia repair     inguinal hernia repair as a child  . Dx laparoscopy 01/13/2011  . Right total hip replacement 08/14/2010  . Hydrocele excision / repair     Family History  Problem Relation Age of Onset  . Cancer  Mother   . Cancer Sister   . Cancer Sister     History  Substance Use Topics  . Smoking status: Current Every Day Smoker -- 0.5 packs/day for 30 years    Types: Cigarettes  . Smokeless tobacco: Never Used  . Alcohol Use: No     occasional basis      Review of Systems  Constitutional: Positive for fever and fatigue. Negative for activity change and appetite change.  HENT: Negative for neck stiffness.   Eyes: Negative for pain.  Respiratory: Negative for chest tightness and shortness of breath.   Cardiovascular: Negative for chest pain and leg swelling.  Gastrointestinal: Positive for nausea, vomiting and abdominal pain. Negative for diarrhea.  Genitourinary: Negative for flank pain.  Musculoskeletal: Negative for back pain.  Skin: Negative for rash.  Neurological: Negative for weakness, numbness and headaches.  Psychiatric/Behavioral: Negative for behavioral problems.    Allergies  No known allergies  Home Medications   Current Outpatient Rx  Name Route Sig Dispense Refill  . METOPROLOL TARTRATE 25 MG PO TABS Oral Take 1 tablet (25 mg total) by mouth daily. 30 tablet 3  . POLYETHYLENE GLYCOL 3350 PO PACK Oral Take 17 g by mouth daily as needed. For constipation    . PROCHLORPERAZINE MALEATE 10 MG PO TABS Oral Take 10 mg by mouth every 6 (six) hours as needed. For nausea    .  OXYCODONE HCL ER 20 MG PO TB12 Oral Take 1 tablet (20 mg total) by mouth every 12 (twelve) hours. 60 tablet 0  . OXYCODONE HCL ER 20 MG PO TB12 Oral Take 1 tablet (20 mg total) by mouth every 12 (twelve) hours. 60 tablet 0  . OXYCODONE-ACETAMINOPHEN 10-325 MG PO TABS Oral Take 1 tablet by mouth every 4 (four) hours as needed for pain (severe pain). 42 tablet 0    BP 159/82  Pulse 83  Temp 98.3 F (36.8 C) (Oral)  Resp 16  SpO2 100%  Physical Exam  Nursing note and vitals reviewed. Constitutional: He is oriented to person, place, and time. He appears well-developed and well-nourished.  HENT:    Head: Normocephalic and atraumatic.  Eyes: EOM are normal. Pupils are equal, round, and reactive to light.  Neck: Normal range of motion. Neck supple.  Cardiovascular: Normal rate, regular rhythm and normal heart sounds.   No murmur heard. Pulmonary/Chest: Effort normal and breath sounds normal.       Port-A-Cath to right chest wall.  Abdominal: Soft. Bowel sounds are normal. He exhibits no distension and no mass. There is tenderness. There is guarding. There is no rebound.       Diffuse tenderness of the abdomen with some voluntary guarding. No hernias.  Musculoskeletal: Normal range of motion. He exhibits no edema.  Neurological: He is alert and oriented to person, place, and time. No cranial nerve deficit.  Skin: Skin is warm and dry.  Psychiatric: He has a normal mood and affect.    ED Course  Procedures (including critical care time)  Labs Reviewed  CBC WITH DIFFERENTIAL - Abnormal; Notable for the following:    RBC 2.74 (*)     Hemoglobin 9.4 (*)     HCT 27.2 (*)     MCH 34.3 (*)     Platelets 78 (*)     All other components within normal limits  COMPREHENSIVE METABOLIC PANEL - Abnormal; Notable for the following:    Potassium 3.2 (*)     Albumin 2.8 (*)     Alkaline Phosphatase 127 (*)     Total Bilirubin 0.2 (*)     All other components within normal limits  LIPASE, BLOOD  URINALYSIS, ROUTINE W REFLEX MICROSCOPIC  CULTURE, BLOOD (ROUTINE X 2)  CULTURE, BLOOD (ROUTINE X 2)   Dg Abd Acute W/chest  02/23/2012  *RADIOLOGY REPORT*  Clinical Data: Abdominal pain, nausea/vomiting, history of pancreatic cancer  ACUTE ABDOMEN SERIES (ABDOMEN 2 VIEW & CHEST 1 VIEW)  Comparison: CT abdomen pelvis dated 05/11/2011.  CT chest abdomen pelvis dated 01/31/2011.  Findings: Lungs are essentially clear. No pleural effusion or pneumothorax.  Cardiomediastinal silhouette is within normal limits.  Right chest power port.  Nonspecific bowel gas pattern without disproportionate small bowel  dilatation to suggest small bowel obstruction.  Moderate stool in the colon.  Surgical sutures in the left upper abdomen.  No evidence of free air under the diaphragm on the upright view.  Right total hip arthroplasty.  IMPRESSION: No evidence of acute cardiopulmonary disease.  No evidence of small bowel obstruction or free air.  Moderate stool in the colon.   Original Report Authenticated By: Charline Bills, M.D.      1. Pancreatic cancer   2. Abdominal pain       MDM  Patient with pancreatic cancer and abdominal pain. Poorly controlled on medicine. He is on palliative radiation at [facility]. Last chemotherapy 4 weeks ago. Lab work is  overall reassuring. Mild anemia and mild thrombocytopenia. Patient's pain has been uncontrolled will be admitted to medicine for further workup. X-ray was nonspecific, however admitting Dr. requests a CT. Patient states she's had fever but was afebrile here. Cultures have been sent.        Juliet Rude. Rubin Payor, MD 02/23/12 [phone]

## 2012-02-24 ENCOUNTER — Encounter: Payer: Self-pay | Admitting: Oncology

## 2012-02-24 LAB — CBC
HCT: 26.1 % — ABNORMAL LOW (ref 39.0–52.0)
Hemoglobin: 8.9 g/dL — ABNORMAL LOW (ref 13.0–17.0)
MCHC: 34.1 g/dL (ref 30.0–36.0)
MCV: 99.2 fL (ref 78.0–100.0)
RDW: 13.2 % (ref 11.5–15.5)
WBC: 3.8 10*3/uL — ABNORMAL LOW (ref 4.0–10.5)

## 2012-02-24 LAB — BASIC METABOLIC PANEL
BUN: 8 mg/dL (ref 6–23)
CO2: 29 mEq/L (ref 19–32)
Chloride: 102 mEq/L (ref 96–112)
Creatinine, Ser: 0.79 mg/dL (ref 0.50–1.35)

## 2012-02-24 MED ORDER — HYDROMORPHONE HCL PF 2 MG/ML IJ SOLN: 2.0000 mg | Freq: Once | INTRAMUSCULAR | Status: AC

## 2012-02-24 NOTE — Progress Notes (Signed)
Patient discharged home. Discharge instructions given to patient and reviewed. Zero prescriptions were given. Patient said he had no questions and signed the D/C instructions. He refused a wheelchair and said he had to walk over to the cancer center to pick up prescriptions from Dr. Gerarda Fraction. Angelena Form, RN

## 2012-02-24 NOTE — Progress Notes (Signed)
Patient came in today to see if we could give him a bus pass and I told him that he has no more funds left,well he got up set and said I will just call [facility].I told him that Lowella Bandy has already left for today,then I call abigail and I explain to her the patient has no more funds,so he left.

## 2012-02-24 NOTE — Discharge Summary (Signed)
Physician Discharge Summary  Las Lomas AVW:[id number] DOB: Nov 12, 1950 DOA: 02/23/2012  PCP: Sheila Oats, MD  Admit date: 02/23/2012 Discharge date: 02/24/2012  Recommendations for Outpatient Follow-up:  1. F/U [facility] for further treatment of pancreatic cancer. 2. F/U final blood/urine culture results.  Discharge Diagnoses:   Principal Problem:  *Abdominal pain  Active Problems:   Pancreatic cancer, s/p whipple 02/09/2010, T2N0   Constipation   Hypertension   Fever   Blood in stool   Discharge Condition: Improved.  Diet recommendation: Regular.  History of present illness:  [name] is a 61 y.o. 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  His symptoms improved within 24 hours, and he requested d/c the morning after his admission.  Hospital Course by problem:  Principal Problem:  *Abdominal pain  CT scan did not show any worrisome findings.  Source of pain likely from large hepatic metastasis.  Pain control achieved prior to d/c. Active Problems:  Pancreatic cancer, s/p whipple 02/09/2010, T2N0  Disease regression noted on CT.  Follow up with Dr. Truett Perna and treating oncologist at [facility] as scheduled.  Constipation  Secondary to chronic narcotics, continue MiraLax.  Hypertension  Stable on metoprolol.  Fever  Patient was not neutropenic and no source of infection identified; he is also not febrile at this point; F/U on blood and urine cx's; but no antibiotics felt to be indicated in the hospital.  Blood in stool  Patient feels is related to hemorrhoids or chemo, and does not want further inpatient evaluation.   Procedures:  None.  Consultations:  None.  Discharge Exam: Filed Vitals:   02/24/12 0700  BP:  140/93  Pulse: 85  Temp: 98 F (36.7 C)  Resp: 20   Filed Vitals:   02/23/12 1728 02/23/12 1939 02/23/12 2056 02/24/12 0700  BP: 179/89 149/77 155/93 140/93  Pulse:  65 79 85  Temp:  98.5 F (36.9 C) 98.4 F (36.9 C) 98 F (36.7 C)  TempSrc:  Oral Oral Oral  Resp:  16 20 20   Height:   6\' 1"  (1.854 m)   Weight:   60.4 kg (133 lb 2.5 oz)   SpO2:  99% 100% 99%    Gen:  NAD Cardiovascular:  RRR, No M/R/G Respiratory: Lungs CTAB Gastrointestinal: Abdomen soft, NT/ND with normal active bowel sounds. Extremities: No C/E/C   Discharge Instructions  Discharge Orders    Future Appointments: Provider: Department: Dept Phone: Center:   05/03/2012 8:00 AM Radene Gunning [facility] [phone] None   05/03/2012 8:30 AM Ladene Artist, MD [facility] [phone] None     Future Orders Please Complete By Expires   Diet general      Increase activity slowly      Call MD for:  temperature >100.4      Call MD for:  persistant nausea and vomiting      Call MD for:  severe uncontrolled pain          Medication List     As of 02/24/2012 11:41 AM    TAKE these medications         metoprolol tartrate 25 MG tablet   Commonly known as: LOPRESSOR   Take 1 tablet (25 mg total) by mouth daily.      oxyCODONE 20 MG 12 hr tablet   Commonly known  as: OXYCONTIN   Take 1 tablet (20 mg total) by mouth every 12 (twelve) hours.      oxyCODONE 20 MG 12 hr tablet   Commonly known as: OXYCONTIN   Take 1 tablet (20 mg total) by mouth every 12 (twelve) hours.      oxyCODONE-acetaminophen 10-325 MG per tablet   Commonly known as: PERCOCET   Take 1 tablet by mouth every 4 (four) hours as needed for pain (severe pain).      polyethylene glycol packet   Commonly known as: MIRALAX / GLYCOLAX   Take 17 g by mouth daily as needed. For constipation      prochlorperazine 10 MG tablet   Commonly known as: COMPAZINE   Take 10 mg by mouth every 6 (six) hours as needed. For nausea            Follow-up Information    Schedule an appointment as soon as possible for a visit with [name], MD. (As needed)    Contact information:   [address] [place] [address] [phone]       Follow up with Your treating MD at [facility]. (as scheduled)           The results of significant diagnostics from this hospitalization (including imaging, microbiology, ancillary and laboratory) are listed below for reference.    Significant Diagnostic Studies:  Ct Abdomen Pelvis W Contrast 02/23/2012 IMPRESSION: 1.  Overall findings compatible with treatment response.  No evidence of new metastatic disease.  2.  While the remainder of the previously identified metastatic disease is stable to substantially decreased in size in the interval, there has been interval increase in the size of the right- sided subcapsular hepatic implant with associated internal high attenuation.  This is favored to represent a small amount of internal hemorrhage and is likely the etiology of the patient's right-sided abdominal pain.  Correlation for point tenderness at this location is recommended.   Original Report Authenticated By: Waynard Reeds, M.D.     Dg Abd Acute W/chest 02/23/2012   IMPRESSION: No evidence of acute cardiopulmonary disease.  No evidence of small bowel obstruction or free air.  Moderate stool in the colon.   Original Report Authenticated By: Charline Bills, M.D.     Microbiology: Recent Results (from the past 240 hour(s))  CULTURE, BLOOD (ROUTINE X 2)     Status: Normal (Preliminary result)   Collection Time   02/23/12 11:45 AM      Component Value Range Status Comment   Specimen Description BLOOD LEFT ARM   Final    Special Requests BOTTLES DRAWN AEROBIC AND ANAEROBIC 4CC   Final    Culture  Setup Time 02/23/2012 16:06   Final    Culture     Final    Value:        BLOOD CULTURE RECEIVED NO GROWTH TO DATE CULTURE WILL BE HELD FOR 5 DAYS BEFORE ISSUING A FINAL NEGATIVE  REPORT   Report Status PENDING   Incomplete   CULTURE, BLOOD (ROUTINE X 2)     Status: Normal (Preliminary result)   Collection Time   02/23/12 12:20 PM      Component Value Range Status Comment   Specimen Description BLOOD PORT   Final    Special Requests BOTTLES DRAWN AEROBIC AND ANAEROBIC   Final    Culture  Setup Time 02/23/2012 16:06   Final    Culture     Final    Value:  BLOOD CULTURE RECEIVED NO GROWTH TO DATE CULTURE WILL BE HELD FOR 5 DAYS BEFORE ISSUING A FINAL NEGATIVE REPORT   Report Status PENDING   Incomplete      Labs: Basic Metabolic Panel:  Lab 02/24/12 4098 02/23/12 1150  NA 137 137  K 3.9 3.2*  CL 102 102  CO2 29 25  GLUCOSE 79 81  BUN 8 7  CREATININE 0.79 0.69  CALCIUM 8.6 8.4  MG -- --  PHOS -- --   Liver Function Tests:  Lab 02/23/12 1150  AST 20  ALT 11  ALKPHOS 127*  BILITOT 0.2*  PROT 6.7  ALBUMIN 2.8*    Lab 02/23/12 1150  LIPASE 30  AMYLASE --   CBC:  Lab 02/24/12 0618 02/23/12 1150  WBC 3.8* 4.0  NEUTROABS -- 2.5  HGB 8.9* 9.4*  HCT 26.1* 27.2*  MCV 99.2 99.3  PLT 80* 78*    Time coordinating discharge: 25 minutes.  Signed:  Korbyn Vanes  Pager [phone] [facility] 02/24/2012, 11:41 AM

## 2012-02-24 NOTE — Progress Notes (Addendum)
Patient refusing IV fluids and SCDs. Patient appears to be irritable this morning and states he is waiting to see an MD and to be discharged today. Angelena Form, RN

## 2012-02-29 LAB — CULTURE, BLOOD (ROUTINE X 2)
Culture: NO GROWTH
Culture: NO GROWTH

## 2012-03-01 ENCOUNTER — Other Ambulatory Visit: Payer: Self-pay | Admitting: *Deleted

## 2012-03-01 DIAGNOSIS — C259 Malignant neoplasm of pancreas, unspecified: Secondary | ICD-10-CM

## 2012-03-01 MED ORDER — OXYCODONE-ACETAMINOPHEN 10-325 MG PO TABS: 1.0000 | ORAL_TABLET | ORAL | Status: DC | PRN

## 2012-03-01 NOTE — Telephone Encounter (Signed)
Patient call to request refill on Percocet. Will pick up script.

## 2012-03-08 ENCOUNTER — Telehealth: Payer: Self-pay | Admitting: *Deleted

## 2012-03-08 DIAGNOSIS — C259 Malignant neoplasm of pancreas, unspecified: Secondary | ICD-10-CM

## 2012-03-08 MED ORDER — OXYCODONE-ACETAMINOPHEN 10-325 MG PO TABS: 1.0000 | ORAL_TABLET | ORAL | Status: DC | PRN

## 2012-03-08 NOTE — Telephone Encounter (Signed)
Message from pt requesting Percocet refill. Refill left in prescription book for pick up. Pt made aware.

## 2012-03-15 ENCOUNTER — Other Ambulatory Visit: Payer: Self-pay | Admitting: *Deleted

## 2012-03-15 DIAGNOSIS — C259 Malignant neoplasm of pancreas, unspecified: Secondary | ICD-10-CM

## 2012-03-15 MED ORDER — OXYCODONE-ACETAMINOPHEN 10-325 MG PO TABS: 1.0000 | ORAL_TABLET | ORAL | Status: DC | PRN

## 2012-03-15 NOTE — Telephone Encounter (Signed)
Requests refill on percocet

## 2012-03-22 ENCOUNTER — Other Ambulatory Visit: Payer: Self-pay | Admitting: *Deleted

## 2012-03-22 DIAGNOSIS — C259 Malignant neoplasm of pancreas, unspecified: Secondary | ICD-10-CM

## 2012-03-22 MED ORDER — OXYCODONE HCL 20 MG PO TB12: 20.0000 mg | ORAL_TABLET | Freq: Two times a day (BID) | ORAL | Status: DC

## 2012-03-22 MED ORDER — OXYCODONE-ACETAMINOPHEN 10-325 MG PO TABS: 1.0000 | ORAL_TABLET | ORAL | Status: DC | PRN

## 2012-03-29 ENCOUNTER — Other Ambulatory Visit: Payer: Self-pay | Admitting: *Deleted

## 2012-03-29 DIAGNOSIS — C259 Malignant neoplasm of pancreas, unspecified: Secondary | ICD-10-CM

## 2012-03-29 MED ORDER — OXYCODONE-ACETAMINOPHEN 10-325 MG PO TABS: 1.0000 | ORAL_TABLET | ORAL | Status: DC | PRN

## 2012-04-04 ENCOUNTER — Other Ambulatory Visit: Payer: Self-pay | Admitting: *Deleted

## 2012-04-04 DIAGNOSIS — C259 Malignant neoplasm of pancreas, unspecified: Secondary | ICD-10-CM

## 2012-04-04 MED ORDER — OXYCODONE-ACETAMINOPHEN 10-325 MG PO TABS: 1.0000 | ORAL_TABLET | ORAL | Status: DC | PRN

## 2012-04-04 NOTE — Telephone Encounter (Signed)
Pt notified that script can be picked up 04/05/12.  Pt. verbalized understanding.

## 2012-04-09 ENCOUNTER — Other Ambulatory Visit: Payer: Self-pay | Admitting: *Deleted

## 2012-04-09 DIAGNOSIS — C259 Malignant neoplasm of pancreas, unspecified: Secondary | ICD-10-CM

## 2012-04-09 MED ORDER — OXYCODONE-ACETAMINOPHEN 10-325 MG PO TABS: 1.0000 | ORAL_TABLET | ORAL | Status: DC | PRN

## 2012-04-09 NOTE — Telephone Encounter (Signed)
Needs to pick up scripts for his Oxycontin and Percocet on Wednesday since office closed on Thursday. Asking if MD will increase the dose of his oxycontin due to his increase in pain. Currently on 20 mg every 12 hours.

## 2012-04-09 NOTE — Telephone Encounter (Signed)
Per Dr. Truett Perna: Will refill Percocet. Will not increase dose on Oxycontin until seen on 05/03/12. Last refill on Oxycontin was 03/22/12--this week is too soon for refill (due 04/19/12).

## 2012-04-10 ENCOUNTER — Other Ambulatory Visit: Payer: Self-pay | Admitting: *Deleted

## 2012-04-10 NOTE — Telephone Encounter (Signed)
Notified patient that MD has approved refill on his Percocet. Will not be able to refill Oxycontin until next week and will discuss dose increase at next office visit on 05/03/12. Patient asking why Dr. Truett Perna has to see him? Made him aware that as long as we are ordering pain medications, he needs to be seen on routine basis and Dr. Truett Perna is concerned about him and wants to know how he is doing.

## 2012-04-17 ENCOUNTER — Other Ambulatory Visit: Payer: Self-pay | Admitting: *Deleted

## 2012-04-17 DIAGNOSIS — C259 Malignant neoplasm of pancreas, unspecified: Secondary | ICD-10-CM

## 2012-04-17 MED ORDER — OXYCODONE-ACETAMINOPHEN 10-325 MG PO TABS: 1.0000 | ORAL_TABLET | ORAL | Status: DC | PRN

## 2012-04-17 NOTE — Telephone Encounter (Signed)
Received call from pt requesting Percocet re-fill.  Called and spoke with pt; script can be picked-up at his convenience.  Pt verbalized understanding.

## 2012-04-23 ENCOUNTER — Other Ambulatory Visit: Payer: Self-pay | Admitting: *Deleted

## 2012-04-23 DIAGNOSIS — C259 Malignant neoplasm of pancreas, unspecified: Secondary | ICD-10-CM

## 2012-04-23 MED ORDER — OXYCODONE HCL 20 MG PO TB12: 20.0000 mg | ORAL_TABLET | Freq: Two times a day (BID) | ORAL | Status: DC

## 2012-04-23 MED ORDER — OXYCODONE-ACETAMINOPHEN 10-325 MG PO TABS: 1.0000 | ORAL_TABLET | ORAL | Status: DC | PRN

## 2012-04-23 NOTE — Telephone Encounter (Signed)
Patient called to request refill on Percocet and Oxycontin.

## 2012-05-01 ENCOUNTER — Encounter (HOSPITAL_COMMUNITY): Payer: Self-pay | Admitting: *Deleted

## 2012-05-01 ENCOUNTER — Emergency Department (HOSPITAL_COMMUNITY): Payer: Medicaid Other

## 2012-05-01 ENCOUNTER — Inpatient Hospital Stay (HOSPITAL_COMMUNITY)
Admission: EM | Admit: 2012-05-01 | Discharge: 2012-05-04 | DRG: 947 | Disposition: A | Discharge status: Home / Self Care | Payer: Medicaid Other | Attending: Internal Medicine | Admitting: Internal Medicine

## 2012-05-01 ENCOUNTER — Telehealth: Payer: Self-pay | Admitting: *Deleted

## 2012-05-01 DIAGNOSIS — D702 Other drug-induced agranulocytosis: Secondary | ICD-10-CM | POA: Diagnosis present

## 2012-05-01 DIAGNOSIS — M545 Low back pain, unspecified: Secondary | ICD-10-CM

## 2012-05-01 DIAGNOSIS — C787 Secondary malignant neoplasm of liver and intrahepatic bile duct: Secondary | ICD-10-CM | POA: Diagnosis present

## 2012-05-01 DIAGNOSIS — R52 Pain, unspecified: Secondary | ICD-10-CM

## 2012-05-01 DIAGNOSIS — Z79899 Other long term (current) drug therapy: Secondary | ICD-10-CM

## 2012-05-01 DIAGNOSIS — K7689 Other specified diseases of liver: Secondary | ICD-10-CM | POA: Diagnosis present

## 2012-05-01 DIAGNOSIS — F1111 Opioid abuse, in remission: Secondary | ICD-10-CM | POA: Diagnosis present

## 2012-05-01 DIAGNOSIS — R1011 Right upper quadrant pain: Secondary | ICD-10-CM

## 2012-05-01 DIAGNOSIS — D61810 Antineoplastic chemotherapy induced pancytopenia: Secondary | ICD-10-CM | POA: Diagnosis present

## 2012-05-01 DIAGNOSIS — C779 Secondary and unspecified malignant neoplasm of lymph node, unspecified: Secondary | ICD-10-CM | POA: Diagnosis present

## 2012-05-01 DIAGNOSIS — C259 Malignant neoplasm of pancreas, unspecified: Secondary | ICD-10-CM

## 2012-05-01 DIAGNOSIS — D709 Neutropenia, unspecified: Secondary | ICD-10-CM

## 2012-05-01 DIAGNOSIS — C801 Malignant (primary) neoplasm, unspecified: Secondary | ICD-10-CM

## 2012-05-01 DIAGNOSIS — I10 Essential (primary) hypertension: Secondary | ICD-10-CM

## 2012-05-01 DIAGNOSIS — Z923 Personal history of irradiation: Secondary | ICD-10-CM

## 2012-05-01 DIAGNOSIS — R509 Fever, unspecified: Secondary | ICD-10-CM

## 2012-05-01 DIAGNOSIS — K5900 Constipation, unspecified: Secondary | ICD-10-CM

## 2012-05-01 DIAGNOSIS — G8929 Other chronic pain: Secondary | ICD-10-CM | POA: Diagnosis present

## 2012-05-01 DIAGNOSIS — C799 Secondary malignant neoplasm of unspecified site: Secondary | ICD-10-CM

## 2012-05-01 DIAGNOSIS — Z90410 Acquired total absence of pancreas: Secondary | ICD-10-CM

## 2012-05-01 DIAGNOSIS — M1990 Unspecified osteoarthritis, unspecified site: Secondary | ICD-10-CM | POA: Diagnosis present

## 2012-05-01 DIAGNOSIS — M549 Dorsalgia, unspecified: Secondary | ICD-10-CM | POA: Diagnosis present

## 2012-05-01 DIAGNOSIS — C50919 Malignant neoplasm of unspecified site of unspecified female breast: Secondary | ICD-10-CM | POA: Diagnosis present

## 2012-05-01 DIAGNOSIS — T451X5A Adverse effect of antineoplastic and immunosuppressive drugs, initial encounter: Secondary | ICD-10-CM | POA: Diagnosis present

## 2012-05-01 DIAGNOSIS — M5106 Intervertebral disc disorders with myelopathy, lumbar region: Secondary | ICD-10-CM

## 2012-05-01 DIAGNOSIS — G893 Neoplasm related pain (acute) (chronic): Principal | ICD-10-CM | POA: Diagnosis present

## 2012-05-01 DIAGNOSIS — F1411 Cocaine abuse, in remission: Secondary | ICD-10-CM | POA: Diagnosis present

## 2012-05-01 DIAGNOSIS — R109 Unspecified abdominal pain: Secondary | ICD-10-CM

## 2012-05-01 DIAGNOSIS — K921 Melena: Secondary | ICD-10-CM

## 2012-05-01 DIAGNOSIS — F17200 Nicotine dependence, unspecified, uncomplicated: Secondary | ICD-10-CM | POA: Diagnosis present

## 2012-05-01 LAB — URINALYSIS, ROUTINE W REFLEX MICROSCOPIC
Leukocytes, UA: NEGATIVE
Nitrite: NEGATIVE
Specific Gravity, Urine: 1.024 (ref 1.005–1.030)
pH: 8 (ref 5.0–8.0)

## 2012-05-01 LAB — BASIC METABOLIC PANEL
BUN: 13 mg/dL (ref 6–23)
Chloride: 105 mEq/L (ref 96–112)
GFR calc Af Amer: >90 mL/min (ref >90)
Potassium: 4 mEq/L (ref 3.5–5.1)
Sodium: 137 mEq/L (ref 135–145)

## 2012-05-01 LAB — CBC WITH DIFFERENTIAL/PLATELET
Basophils Absolute: 0 10*3/uL (ref 0.0–0.1)
HCT: 25.9 % — ABNORMAL LOW (ref 39.0–52.0)
Lymphocytes Relative: 20 % (ref 12–46)
Monocytes Absolute: 0.1 10*3/uL (ref 0.1–1.0)
Neutro Abs: 1.9 10*3/uL (ref 1.7–7.7)
RDW: 14 % (ref 11.5–15.5)
WBC: 2.6 10*3/uL — ABNORMAL LOW (ref 4.0–10.5)

## 2012-05-01 MED ORDER — HEPARIN SODIUM (PORCINE) 5000 UNIT/ML IJ SOLN
5000.0000 [IU] | Freq: Three times a day (TID) | INTRAMUSCULAR | Status: DC
  Filled 2012-05-01 (×11): qty 1

## 2012-05-01 MED ORDER — PROCHLORPERAZINE MALEATE 10 MG PO TABS
10.0000 mg | ORAL_TABLET | Freq: Four times a day (QID) | ORAL | Status: DC | PRN
  Administered 2012-05-03: 10 mg via ORAL
  Filled 2012-05-01: qty 1

## 2012-05-01 MED ORDER — OXYCODONE-ACETAMINOPHEN 5-325 MG PO TABS
1.0000 | ORAL_TABLET | ORAL | Status: DC | PRN
  Administered 2012-05-02: 1 via ORAL
  Filled 2012-05-01: qty 1

## 2012-05-01 MED ORDER — OXYCODONE HCL 5 MG PO TABS: 5.0000 mg | ORAL_TABLET | ORAL | Status: DC | PRN

## 2012-05-01 MED ORDER — DIPHENHYDRAMINE HCL 50 MG/ML IJ SOLN: 12.5000 mg | Freq: Four times a day (QID) | INTRAMUSCULAR | Status: DC | PRN

## 2012-05-01 MED ORDER — SODIUM CHLORIDE 0.9 % IV SOLN: 250.0000 mL | INTRAVENOUS | Status: DC | PRN

## 2012-05-01 MED ORDER — OXYCODONE-ACETAMINOPHEN 10-325 MG PO TABS: 1.0000 | ORAL_TABLET | ORAL | Status: DC | PRN

## 2012-05-01 MED ORDER — HYDROMORPHONE HCL PF 2 MG/ML IJ SOLN
2.0000 mg | Freq: Once | INTRAMUSCULAR | Status: AC
  Administered 2012-05-01: 1 mg via INTRAVENOUS
  Filled 2012-05-01: qty 1

## 2012-05-01 MED ORDER — HYDROMORPHONE HCL PF 1 MG/ML IJ SOLN
1.0000 mg | INTRAMUSCULAR | Status: DC | PRN
  Administered 2012-05-01: 1 mg via INTRAVENOUS
  Filled 2012-05-01 (×2): qty 1

## 2012-05-01 MED ORDER — SODIUM CHLORIDE 0.9 % IJ SOLN
3.0000 mL | Freq: Two times a day (BID) | INTRAMUSCULAR | Status: DC
  Administered 2012-05-03: 3 mL via INTRAVENOUS

## 2012-05-01 MED ORDER — SORBITOL 70 % SOLN: 30.0000 mL | Freq: Every day | Status: DC | PRN

## 2012-05-01 MED ORDER — ONDANSETRON HCL 4 MG/2ML IJ SOLN
4.0000 mg | Freq: Four times a day (QID) | INTRAMUSCULAR | Status: DC | PRN
  Administered 2012-05-02: 4 mg via INTRAVENOUS
  Filled 2012-05-01 (×2): qty 2

## 2012-05-01 MED ORDER — METOPROLOL TARTRATE 25 MG PO TABS
25.0000 mg | ORAL_TABLET | Freq: Every day | ORAL | Status: DC
  Administered 2012-05-02 – 2012-05-04 (×3): 25 mg via ORAL
  Filled 2012-05-01 (×3): qty 1

## 2012-05-01 MED ORDER — HYDROMORPHONE 0.3 MG/ML IV SOLN
INTRAVENOUS | Status: DC
  Administered 2012-05-01: via INTRAVENOUS
  Administered 2012-05-02: 3 mg via INTRAVENOUS
  Administered 2012-05-02: 09:00:00 via INTRAVENOUS
  Filled 2012-05-01 (×2): qty 25

## 2012-05-01 MED ORDER — POLYETHYLENE GLYCOL 3350 17 G PO PACK
17.0000 g | PACK | Freq: Every day | ORAL | Status: DC | PRN
  Filled 2012-05-01: qty 1

## 2012-05-01 MED ORDER — HYDROMORPHONE HCL PF 1 MG/ML IJ SOLN
1.0000 mg | INTRAMUSCULAR | Status: DC | PRN
  Administered 2012-05-01 – 2012-05-04 (×23): 1 mg via INTRAVENOUS
  Filled 2012-05-01 (×23): qty 1

## 2012-05-01 MED ORDER — ONDANSETRON HCL 4 MG PO TABS: 4.0000 mg | ORAL_TABLET | Freq: Four times a day (QID) | ORAL | Status: DC | PRN

## 2012-05-01 MED ORDER — ONDANSETRON HCL 4 MG/2ML IJ SOLN: 4.0000 mg | Freq: Four times a day (QID) | INTRAMUSCULAR | Status: DC | PRN

## 2012-05-01 MED ORDER — SODIUM CHLORIDE 0.9 % IJ SOLN: 9.0000 mL | INTRAMUSCULAR | Status: DC | PRN

## 2012-05-01 MED ORDER — SODIUM CHLORIDE 0.9 % IV BOLUS (SEPSIS)
1000.0000 mL | Freq: Once | INTRAVENOUS | Status: AC
  Administered 2012-05-01: 1000 mL via INTRAVENOUS

## 2012-05-01 MED ORDER — OXYCODONE HCL ER 40 MG PO T12A
40.0000 mg | EXTENDED_RELEASE_TABLET | Freq: Two times a day (BID) | ORAL | Status: DC
  Administered 2012-05-02: 40 mg via ORAL
  Filled 2012-05-01 (×2): qty 1

## 2012-05-01 MED ORDER — NALOXONE HCL 0.4 MG/ML IJ SOLN: 0.4000 mg | INTRAMUSCULAR | Status: DC | PRN

## 2012-05-01 MED ORDER — SODIUM CHLORIDE 0.9 % IJ SOLN: 3.0000 mL | INTRAMUSCULAR | Status: DC | PRN

## 2012-05-01 MED ORDER — ONDANSETRON HCL 4 MG/2ML IJ SOLN
4.0000 mg | Freq: Once | INTRAMUSCULAR | Status: AC
  Administered 2012-05-01: 4 mg via INTRAVENOUS
  Filled 2012-05-01: qty 2

## 2012-05-01 MED ORDER — IOHEXOL 350 MG/ML SOLN
100.0000 mL | Freq: Once | INTRAVENOUS | Status: AC | PRN
  Administered 2012-05-01: 100 mL via INTRAVENOUS

## 2012-05-01 MED ORDER — HYDROMORPHONE HCL PF 1 MG/ML IJ SOLN
1.0000 mg | INTRAMUSCULAR | Status: AC
  Administered 2012-05-01 (×3): 1 mg via INTRAVENOUS
  Filled 2012-05-01 (×2): qty 1

## 2012-05-01 MED ORDER — HYDROMORPHONE HCL PF 2 MG/ML IJ SOLN
2.0000 mg | Freq: Once | INTRAMUSCULAR | Status: AC
  Administered 2012-05-01: 2 mg via INTRAVENOUS
  Filled 2012-05-01: qty 1

## 2012-05-01 MED ORDER — DIPHENHYDRAMINE HCL 12.5 MG/5ML PO ELIX: 12.5000 mg | ORAL_SOLUTION | Freq: Four times a day (QID) | ORAL | Status: DC | PRN

## 2012-05-01 NOTE — ED Provider Notes (Signed)
History     CSN: [id number]  Arrival date & time 05/01/12  1750   First MD Initiated Contact with Patient 05/01/12 1903      No chief complaint on file.   (Consider location/radiation/quality/duration/timing/severity/associated sxs/prior treatment) HPI  Past Medical History  Diagnosis Date  . Diarrhea   . Nausea   . Rash     on abdomen from radiation   . Chronic back pain     r/t disc disease--w/bilateral leg pain  . History of drug abuse     Remote--heroin & cocaine  . 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 Hypertension   . Osteoarthritis   . Pancreatitis   . 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 Hx of radiation therapy 02/08/11 to 02/21/11    palliative- RUQ abd, R lateral hepatic mets  . Pancreatic cancer 03/2010    poorly differentiated adenocarcinoma  . Hx of radiation therapy 05/13/11 to 06/01/11    pelvis    Past Surgical History  Procedure Date  . Whipple procedure 02/08/2010  . Portacath placement 01/06/11    8/15/12tip in superior cavoatrial junction  . Hernia repair     inguinal hernia repair as a child  . Dx laparoscopy 01/13/2011  . Right total hip replacement 08/14/2010  . Hydrocele excision / repair     Family History  Problem Relation Age of Onset  . Cancer Mother   . Cancer Sister   . Cancer Sister     History  Substance Use Topics  . Smoking status: Current Every Day Smoker -- 0.5 packs/day for 30 years    Types: Cigarettes  . Smokeless tobacco: Never Used  . Alcohol Use: No     Comment: occasional basis      Review of Systems  Allergies  No known allergies  Home Medications   Current Outpatient Rx  Name  Route  Sig  Dispense  Refill  . METOPROLOL TARTRATE 25 MG PO TABS   Oral   Take 1 tablet (25 mg total) by mouth daily.   30 tablet   3   . OXYCODONE HCL ER 20 MG PO TB12   Oral   Take 20 mg by mouth every 12 (twelve) hours.         . OXYCODONE-ACETAMINOPHEN 10-325 MG PO TABS   Oral    Take 1 tablet by mouth every 4 (four) hours as needed. Pain         . POLYETHYLENE GLYCOL 3350 PO PACK   Oral   Take 17 g by mouth daily as needed. For constipation         . PROCHLORPERAZINE MALEATE 10 MG PO TABS   Oral   Take 10 mg by mouth every 6 (six) hours as needed. For nausea           BP 139/91  Pulse 109  Temp 98.6 F (37 C) (Oral)  Resp 18  SpO2 98%  Physical Exam  ED Course  Procedures (including critical care time)  Labs Reviewed  CBC WITH DIFFERENTIAL - Abnormal; Notable for the following:    WBC 2.6 (*)     RBC 2.62 (*)     Hemoglobin 8.7 (*)     HCT 25.9 (*)     Platelets 66 (*)     Lymphs Abs 0.5 (*)     All other components within normal limits  BASIC METABOLIC PANEL  URINALYSIS, ROUTINE W REFLEX MICROSCOPIC   Dg Chest 2 View  05/01/2012  *RADIOLOGY REPORT*  Clinical Data: Short of breath.  Cough.  Abdominal pain. Pancreatic carcinoma.  CHEST - 2 VIEW  Comparison: 02/23/2012  Findings: Heart size and mediastinal contours are normal.  Both lungs are clear.  No evidence of pleural effusion.  No mass or lymphadenopathy identified.  Right-sided power port remains in appropriate position.  IMPRESSION: No active cardiopulmonary disease.   Original Report Authenticated By: Myles Rosenthal, M.D.    Ct Angio Chest Pe W/cm &/or Wo Cm  05/01/2012  *RADIOLOGY REPORT*  Clinical Data:  Metastatic pancreatic cancer status post Whipple, abdominal pain, evaluate for PE  CT ANGIOGRAPHY CHEST CT ABDOMEN AND PELVIS WITH CONTRAST  Technique:  Multidetector CT imaging of the chest was performed using the standard protocol during bolus administration of intravenous contrast.  Multiplanar CT image reconstructions including MIPs were obtained to evaluate the vascular anatomy. Multidetector CT imaging of the abdomen and pelvis was performed using the standard protocol during bolus administration of intravenous contrast.  Contrast: OMNIPAQUE IOHEXOL 350 MG/ML SOLN  Comparison:   CT abdomen pelvis dated 02/23/2012.  CT chest dated 01/31/2011.  CTA CHEST  Findings:  No evidence of pulmonary embolism.  No suspicious pulmonary nodules.  Mild paraseptal emphysematous changes.  Biapical pleural parenchymal scarring.  Minimal dependent atelectasis in the bilateral lower lobes.  No pleural effusion or pneumothorax.  Visualized thyroid is unremarkable.  The heart is normal in size.  No pericardial effusion.  Small mediastinal and left axillary lymph nodes which do not meet pathologic CT size criteria.  No suspicious hilar lymphadenopathy.  Right chest port.  Degenerative changes of the visualized thoracolumbar spine.   Review of the MIP images confirms the above findings.  IMPRESSION: No evidence of pulmonary embolism.  No evidence of metastatic disease in the chest.  Mild paraseptal emphysematous changes.  CT ABDOMEN AND PELVIS  Findings: Postsurgical changes related to Whipple procedure. Residual pancreatic body and tail are unremarkable.  Patent gastrojejunostomy.  No evidence of bowel obstruction.  Gallbladder is surgical surgically absent.  Associated pneumobilia. No intrahepatic or extrahepatic ductal dilatation.  No suspicious/enhancing hepatic lesions.  4.0 x 2.5 cm peritoneal implant along the right hepatic dome (series 5/image 11), previously 4.1 x 1.9 cm, mildly increased.  Peritoneal implant along the inferior right hepatic lobe measures 7 mm short axis (series 5/image 34), previously 9 mm.  Spleen and adrenal glands are within normal limits.  Kidneys are within normal limits.  No hydronephrosis.  Atherosclerotic calcifications of the abdominal aorta and branch vessels.  No abdominopelvic ascites.  Pelvic metastases have mildly decreased.  Right pelvic implant measures 2.8 x 1.9 cm (series 5/image 73), previously 3.3 x 2.1 cm. Left pelvic can plan measures 2.2 x 3.5 cm (series 5/image 74), previously 2.3 x 3.6 cm.  Prostate is unremarkable.  Bladder is within normal limits.  3.2 x 4.4  cm metastasis in the right rectus abdominus muscle (series 5/image 44), previously 2.9 x 4.1 cm, mildly increased.  Degenerative changes of the lumbar spine.  Review of the MIP images confirms the above findings.  IMPRESSION: Status post Whipple procedure.  Metastases along the right hepatic dome and right rectus abdominus muscle have mildly increased, as described above.  Pelvic metastases have mildly decreased, as described above.   Original Report Authenticated By: Charline Bills, M.D.    Ct Abdomen Pelvis W Contrast  05/01/2012  *RADIOLOGY REPORT*  Clinical Data:  Metastatic pancreatic cancer status post Whipple, abdominal pain, evaluate for PE  CT ANGIOGRAPHY  CHEST CT ABDOMEN AND PELVIS WITH CONTRAST  Technique:  Multidetector CT imaging of the chest was performed using the standard protocol during bolus administration of intravenous contrast.  Multiplanar CT image reconstructions including MIPs were obtained to evaluate the vascular anatomy. Multidetector CT imaging of the abdomen and pelvis was performed using the standard protocol during bolus administration of intravenous contrast.  Contrast: OMNIPAQUE IOHEXOL 350 MG/ML SOLN  Comparison:  CT abdomen pelvis dated 02/23/2012.  CT chest dated 01/31/2011.  CTA CHEST  Findings:  No evidence of pulmonary embolism.  No suspicious pulmonary nodules.  Mild paraseptal emphysematous changes.  Biapical pleural parenchymal scarring.  Minimal dependent atelectasis in the bilateral lower lobes.  No pleural effusion or pneumothorax.  Visualized thyroid is unremarkable.  The heart is normal in size.  No pericardial effusion.  Small mediastinal and left axillary lymph nodes which do not meet pathologic CT size criteria.  No suspicious hilar lymphadenopathy.  Right chest port.  Degenerative changes of the visualized thoracolumbar spine.   Review of the MIP images confirms the above findings.  IMPRESSION: No evidence of pulmonary embolism.  No evidence of  metastatic disease in the chest.  Mild paraseptal emphysematous changes.  CT ABDOMEN AND PELVIS  Findings: Postsurgical changes related to Whipple procedure. Residual pancreatic body and tail are unremarkable.  Patent gastrojejunostomy.  No evidence of bowel obstruction.  Gallbladder is surgical surgically absent.  Associated pneumobilia. No intrahepatic or extrahepatic ductal dilatation.  No suspicious/enhancing hepatic lesions.  4.0 x 2.5 cm peritoneal implant along the right hepatic dome (series 5/image 11), previously 4.1 x 1.9 cm, mildly increased.  Peritoneal implant along the inferior right hepatic lobe measures 7 mm short axis (series 5/image 34), previously 9 mm.  Spleen and adrenal glands are within normal limits.  Kidneys are within normal limits.  No hydronephrosis.  Atherosclerotic calcifications of the abdominal aorta and branch vessels.  No abdominopelvic ascites.  Pelvic metastases have mildly decreased.  Right pelvic implant measures 2.8 x 1.9 cm (series 5/image 73), previously 3.3 x 2.1 cm. Left pelvic can plan measures 2.2 x 3.5 cm (series 5/image 74), previously 2.3 x 3.6 cm.  Prostate is unremarkable.  Bladder is within normal limits.  3.2 x 4.4 cm metastasis in the right rectus abdominus muscle (series 5/image 44), previously 2.9 x 4.1 cm, mildly increased.  Degenerative changes of the lumbar spine.  Review of the MIP images confirms the above findings.  IMPRESSION: Status post Whipple procedure.  Metastases along the right hepatic dome and right rectus abdominus muscle have mildly increased, as described above.  Pelvic metastases have mildly decreased, as described above.   Original Report Authenticated By: Charline Bills, M.D.      1. Metastasis   2. Pain       MDM   CT scan reviewed  Patient requiring admission for pain control         Arman Filter, NP 05/01/12 2231

## 2012-05-01 NOTE — ED Provider Notes (Signed)
History     CSN: [id number]  Arrival date & time 05/01/12  1750   First MD Initiated Contact with Patient 05/01/12 1903      No chief complaint on file.   (Consider location/radiation/quality/duration/timing/severity/associated sxs/prior treatment) HPI 61 year old male with past medical history significant for metastatic adenocarcinoma of the pancreas status post Whipple procedure.  Patient reports for the past 5 days he has had persistent pain to his right abdomen. Describe pain as a throbbing and pressure sensation, constant, worse with palpation, with associated shortness of breath on exertion. He endorsed some nausea without vomiting. He has been taking his home pain medication without relief. He is unable to manage his pains and felt that he will need to be admitted for further pain control. He is currently received chemotherapy from [facility] and is on his seventh treatment. He has called his oncologist and about his pain and was recommended to come to the ED for further management.     Past Medical History  Diagnosis Date  . Diarrhea   . Nausea   . Rash     on abdomen from radiation   . Chronic back pain     r/t disc disease--w/bilateral leg pain  . History of drug abuse     Remote--heroin & cocaine  . 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 Hypertension   . Osteoarthritis   . Pancreatitis   . 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 Hx of radiation therapy 02/08/11 to 02/21/11    palliative- RUQ abd, R lateral hepatic mets  . Pancreatic cancer 03/2010    poorly differentiated adenocarcinoma  . Hx of radiation therapy 05/13/11 to 06/01/11    pelvis    Past Surgical History  Procedure Date  . Whipple procedure 02/08/2010  . Portacath placement 01/06/11    8/15/12tip in superior cavoatrial junction  . Hernia repair     inguinal hernia repair as a child  . Dx laparoscopy 01/13/2011  . Right total hip replacement 08/14/2010  . Hydrocele  excision / repair     Family History  Problem Relation Age of Onset  . Cancer Mother   . Cancer Sister   . Cancer Sister     History  Substance Use Topics  . Smoking status: Current Every Day Smoker -- 0.5 packs/day for 30 years    Types: Cigarettes  . Smokeless tobacco: Never Used  . Alcohol Use: No     Comment: occasional basis      Review of Systems  All other systems reviewed and are negative.    Allergies  No known allergies  Home Medications   Current Outpatient Rx  Name  Route  Sig  Dispense  Refill  . METOPROLOL TARTRATE 25 MG PO TABS   Oral   Take 1 tablet (25 mg total) by mouth daily.   30 tablet   3   . OXYCODONE HCL ER 20 MG PO TB12   Oral   Take 20 mg by mouth every 12 (twelve) hours.         . OXYCODONE-ACETAMINOPHEN 10-325 MG PO TABS   Oral   Take 1 tablet by mouth every 4 (four) hours as needed. Pain         . POLYETHYLENE GLYCOL 3350 PO PACK   Oral   Take 17 g by mouth daily as needed. For constipation         . PROCHLORPERAZINE MALEATE 10 MG PO TABS  Oral   Take 10 mg by mouth every 6 (six) hours as needed. For nausea           BP 139/91  Pulse 109  Temp 98.6 F (37 C) (Oral)  Resp 18  SpO2 98%  Physical Exam  Nursing note and vitals reviewed. Constitutional: He is oriented to person, place, and time. He appears well-developed and well-nourished. No distress.       Awake, alert, nontoxic appearance  HENT:  Head: Normocephalic and atraumatic.  Mouth/Throat: Oropharynx is clear and moist.  Eyes: Conjunctivae normal are normal. Right eye exhibits no discharge. Left eye exhibits no discharge.  Neck: Normal range of motion. Neck supple.  Cardiovascular: Normal rate and regular rhythm.   Pulmonary/Chest: Effort normal. No respiratory distress. He exhibits no tenderness (R lower chest wall tender on palpation, no crepitus.  Firm Mass noted to anterior R lower rib).  Abdominal: Soft. He exhibits mass (Firmness mass noted to  right upper quadrant on palpation, with moderate tenderness. No guarding or rebound). There is tenderness. There is no rebound.  Musculoskeletal: Normal range of motion. He exhibits no edema and no tenderness.       ROM appears intact, no obvious focal weakness  Neurological: He is alert and oriented to person, place, and time.  Skin: Skin is warm and dry. No rash noted.  Psychiatric: He has a normal mood and affect.    ED Course  Procedures (including critical care time)  Results for orders placed during the hospital encounter of 05/01/12  CBC WITH DIFFERENTIAL      Component Value Range   WBC 2.6 (*) 4.0 - 10.5 K/uL   RBC 2.62 (*) 4.22 - 5.81 MIL/uL   Hemoglobin 8.7 (*) 13.0 - 17.0 g/dL   HCT 16.1 (*) 09.6 - 04.5 %   MCV 98.9  78.0 - 100.0 fL   MCH 33.2  26.0 - 34.0 pg   MCHC 33.6  30.0 - 36.0 g/dL   RDW 40.9  81.1 - 91.4 %   Platelets PENDING  150 - 400 K/uL   Neutrophils Relative 75  43 - 77 %   Neutro Abs 1.9  1.7 - 7.7 K/uL   Lymphocytes Relative 20  12 - 46 %   Lymphs Abs 0.5 (*) 0.7 - 4.0 K/uL   Monocytes Relative 4  3 - 12 %   Monocytes Absolute 0.1  0.1 - 1.0 K/uL   Eosinophils Relative 0  0 - 5 %   Eosinophils Absolute 0.0  0.0 - 0.7 K/uL   Basophils Relative 0  0 - 1 %   Basophils Absolute 0.0  0.0 - 0.1 K/uL  BASIC METABOLIC PANEL      Component Value Range   Sodium 137  135 - 145 mEq/L   Potassium 4.0  3.5 - 5.1 mEq/L   Chloride 105  96 - 112 mEq/L   CO2 26  19 - 32 mEq/L   Glucose, Bld 83  70 - 99 mg/dL   BUN 13  6 - 23 mg/dL   Creatinine, Ser 7.82  0.50 - 1.35 mg/dL   Calcium 8.7  8.4 - 95.6 mg/dL   GFR calc non Af Amer >90  >90 mL/min   GFR calc Af Amer >90  >90 mL/min   Dg Chest 2 View  05/01/2012  *RADIOLOGY REPORT*  Clinical Data: Short of breath.  Cough.  Abdominal pain. Pancreatic carcinoma.  CHEST - 2 VIEW  Comparison: 02/23/2012  Findings: Heart size and  mediastinal contours are normal.  Both lungs are clear.  No evidence of pleural effusion.   No mass or lymphadenopathy identified.  Right-sided power port remains in appropriate position.  IMPRESSION: No active cardiopulmonary disease.   Original Report Authenticated By: Myles Rosenthal, M.D.     1. abd pain 2. SOB   MDM  Patient with history of metastatic adenocarcinoma currently on chemotherapy treatment presents complaining of worsening abdominal pain with shortness of breath.  Pain is acute on chronic.  Pt has abd/pelvis CT performed 2 months ago which shows interval increase in the size of the Right-sided  subcapsular hepatic implant with associated internal high attenuation. This is favored to represent a small amount of internal hemorrhage and is likely the etiology of the patient's right-sided abdominal pain. Will reimage with another CT today for further evaluation.    Pt endorse SOB, which is new, concerning for PE.  Will obtain chest CTA.  Care discussed with my attending.  Pt request to be admitted for pain management as he fail outpt care.    8:09 PM On reassessment, pt continues to endorse pain, minimally improved after 2mg  of Dilaudid.  Care discussed with Sabino Dick, NP who will continue care.   BP 139/91  Pulse 109  Temp 98.6 F (37 C) (Oral)  Resp 18  SpO2 98%  I have reviewed nursing notes and vital signs. I personally reviewed the imaging tests through PACS system  I reviewed available ER/hospitalization records thought the EMR   Fayrene Helper, [place] 05/01/12 2011

## 2012-05-01 NOTE — ED Notes (Signed)
Pt states is a ca pt and does chemo, called that doctor and was told to come here for R sided abdominal pain, pressure. Denies n/v/d.

## 2012-05-01 NOTE — ED Notes (Signed)
Pt given meal per Dondra Spry NP,  Pt also given more pain medication,  Pt is alert and oriented in NAD

## 2012-05-01 NOTE — Telephone Encounter (Signed)
Left VM requesting refill on his Percocet. Also reports "real, real bad trouble with my stomach. I've called [facility] and waiting to see what to do. Let Dr. Truett Perna know.

## 2012-05-01 NOTE — H&P (Signed)
[facility] History and Physical  [name] ZOX:[id number] DOB: August 28, 1950 DOA: 05/01/2012  Referring physician: Dr. Harl Bowie PCP: Sheila Oats, MD  Specialists: none  Chief Complaint: right upper quadrant pain  HPI: [name] is a 61 y.o. male  Past medical history of pancreatic adenocarcinoma status post Whipple disease, currently on chemotherapy at [facility] Patient reports for the past 5 days he has had persistent pain to his right abdomen. Describe pain as a throbbing and pressure sensation, constant, worse with palpation, with associated shortness of breath on exertion. He has been taking his home pain medication without relief. He is unable to manage his pains and felt that he will need to come to the hospital for further management of his pain. In the emergency room his gotten 6 mg of Dilaudid and 4 hours and his pain has somewhat improved but still limiting his obliquely to move around.  Review of Systems: The patient denies anorexia, fever, weight loss,, vision loss, decreased hearing, hoarseness, chest pain, syncope, dyspnea on exertion, peripheral edema, balance deficits, hemoptysis, abdominal pain, melena, hematochezia, severe indigestion/heartburn, hematuria, incontinence, genital sores, muscle weakness, suspicious skin lesions, transient blindness, difficulty walking, depression, unusual weight change, abnormal bleeding, enlarged lymph nodes, angioedema, and breast masses.    Past Medical History  Diagnosis Date  . Diarrhea   . Nausea   . Rash     on abdomen from radiation   . Chronic back pain     r/t disc disease--w/bilateral leg pain  . History of drug abuse     Remote--heroin & cocaine  . 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 Hypertension   . Osteoarthritis   . Pancreatitis   . 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 Hx of radiation therapy 02/08/11 to 02/21/11    palliative- RUQ abd, R lateral hepatic mets  .  Pancreatic cancer 03/2010    poorly differentiated adenocarcinoma  . Hx of radiation therapy 05/13/11 to 06/01/11    pelvis   Past Surgical History  Procedure Date  . Whipple procedure 02/08/2010  . Portacath placement 01/06/11    8/15/12tip in superior cavoatrial junction  . Hernia repair     inguinal hernia repair as a child  . Dx laparoscopy 01/13/2011  . Right total hip replacement 08/14/2010  . Hydrocele excision / repair    Social History:  reports that he has been smoking Cigarettes.  He has a 15 pack-year smoking history. He has never used smokeless tobacco. He reports that he does not drink alcohol or use illicit drugs. Visit home by himself can perform all his ADLs  Allergies  Allergen Reactions  . No Known Allergies     Family History  Problem Relation Age of Onset  . Cancer Mother   . Cancer Sister   . Cancer Sister     Prior to Admission medications   Medication Sig Start Date End Date Taking? Authorizing Provider  metoprolol tartrate (LOPRESSOR) 25 MG tablet Take 1 tablet (25 mg total) by mouth daily. 10/26/11  Yes Rana Snare, NP  oxyCODONE (OXYCONTIN) 20 MG 12 hr tablet Take 20 mg by mouth every 12 (twelve) hours. 04/23/12  Yes Ladene Artist, MD  oxyCODONE-acetaminophen (PERCOCET) 10-325 MG per tablet Take 1 tablet by mouth every 4 (four) hours as needed. Pain 05/01/12  Yes Ladene Artist, MD  polyethylene glycol [facility] / GLYCOLAX) packet Take 17 g by mouth daily as needed. For constipation   Yes Historical Provider, MD  prochlorperazine (COMPAZINE) 10 MG tablet Take 10 mg by mouth every 6 (six) hours as needed. For nausea   Yes Historical Provider, MD   Physical Exam: Filed Vitals:   05/01/12 1841  BP: 139/91  Pulse: 109  Temp: 98.6 F (37 C)  TempSrc: Oral  Resp: 18  SpO2: 98%     General:  Awake alert and oriented x3 laying still in bed  Eyes: Anicteric  ENT: Moist membranes  Neck: No JVD  Cardiovascular: Regular rate and rhythm with  positive S1 and S2 no murmurs rubs gallops  Respiratory: Good air movement clear to auscultation  Abdomen: Positive bowel sounds tender in the right upper quadrant there is a indurated mass fixated. He's also tender along the right side of his rib cage  Skin: No rashes or ulceration midline scar from his  Whipple disease  Musculoskeletal: Intact  Psychiatric: Appropriate  Neurologic: Nonfocal  Labs on Admission:  Basic Metabolic Panel:  Lab 05/01/12 1610  NA 137  K 4.0  CL 105  CO2 26  GLUCOSE 83  BUN 13  CREATININE 0.80  CALCIUM 8.7  MG --  PHOS --   Liver Function Tests: No results found for this basename: AST:5,ALT:5,ALKPHOS:5,BILITOT:5,PROT:5,ALBUMIN:5 in the last 168 hours No results found for this basename: LIPASE:5,AMYLASE:5 in the last 168 hours No results found for this basename: AMMONIA:5 in the last 168 hours CBC:  Lab 05/01/12 1920  WBC 2.6*  NEUTROABS 1.9  HGB 8.7*  HCT 25.9*  MCV 98.9  PLT 66*   Cardiac Enzymes: No results found for this basename: CKTOTAL:5,CKMB:5,CKMBINDEX:5,TROPONINI:5 in the last 168 hours  BNP (last 3 results) No results found for this basename: PROBNP:3 in the last 8760 hours CBG: No results found for this basename: GLUCAP:5 in the last 168 hours  Radiological Exams on Admission: Dg Chest 2 View  05/01/2012  *RADIOLOGY REPORT*  Clinical Data: Short of breath.  Cough.  Abdominal pain. Pancreatic carcinoma.  CHEST - 2 VIEW  Comparison: 02/23/2012  Findings: Heart size and mediastinal contours are normal.  Both lungs are clear.  No evidence of pleural effusion.  No mass or lymphadenopathy identified.  Right-sided power port remains in appropriate position.  IMPRESSION: No active cardiopulmonary disease.   Original Report Authenticated By: Myles Rosenthal, M.D.    Ct Angio Chest Pe W/cm &/or Wo Cm  05/01/2012  *RADIOLOGY REPORT*  Clinical Data:  Metastatic pancreatic cancer status post Whipple, abdominal pain, evaluate for PE  CT  ANGIOGRAPHY CHEST CT ABDOMEN AND PELVIS WITH CONTRAST  Technique:  Multidetector CT imaging of the chest was performed using the standard protocol during bolus administration of intravenous contrast.  Multiplanar CT image reconstructions including MIPs were obtained to evaluate the vascular anatomy. Multidetector CT imaging of the abdomen and pelvis was performed using the standard protocol during bolus administration of intravenous contrast.  Contrast: OMNIPAQUE IOHEXOL 350 MG/ML SOLN  Comparison:  CT abdomen pelvis dated 02/23/2012.  CT chest dated 01/31/2011.  CTA CHEST  Findings:  No evidence of pulmonary embolism.  No suspicious pulmonary nodules.  Mild paraseptal emphysematous changes.  Biapical pleural parenchymal scarring.  Minimal dependent atelectasis in the bilateral lower lobes.  No pleural effusion or pneumothorax.  Visualized thyroid is unremarkable.  The heart is normal in size.  No pericardial effusion.  Small mediastinal and left axillary lymph nodes which do not meet pathologic CT size criteria.  No suspicious hilar lymphadenopathy.  Right chest port.  Degenerative changes of the visualized thoracolumbar spine.  Review of the MIP images confirms the above findings.  IMPRESSION: No evidence of pulmonary embolism.  No evidence of metastatic disease in the chest.  Mild paraseptal emphysematous changes.  CT ABDOMEN AND PELVIS  Findings: Postsurgical changes related to Whipple procedure. Residual pancreatic body and tail are unremarkable.  Patent gastrojejunostomy.  No evidence of bowel obstruction.  Gallbladder is surgical surgically absent.  Associated pneumobilia. No intrahepatic or extrahepatic ductal dilatation.  No suspicious/enhancing hepatic lesions.  4.0 x 2.5 cm peritoneal implant along the right hepatic dome (series 5/image 11), previously 4.1 x 1.9 cm, mildly increased.  Peritoneal implant along the inferior right hepatic lobe measures 7 mm short axis (series 5/image 34), previously 9  mm.  Spleen and adrenal glands are within normal limits.  Kidneys are within normal limits.  No hydronephrosis.  Atherosclerotic calcifications of the abdominal aorta and branch vessels.  No abdominopelvic ascites.  Pelvic metastases have mildly decreased.  Right pelvic implant measures 2.8 x 1.9 cm (series 5/image 73), previously 3.3 x 2.1 cm. Left pelvic can plan measures 2.2 x 3.5 cm (series 5/image 74), previously 2.3 x 3.6 cm.  Prostate is unremarkable.  Bladder is within normal limits.  3.2 x 4.4 cm metastasis in the right rectus abdominus muscle (series 5/image 44), previously 2.9 x 4.1 cm, mildly increased.  Degenerative changes of the lumbar spine.  Review of the MIP images confirms the above findings.  IMPRESSION: Status post Whipple procedure.  Metastases along the right hepatic dome and right rectus abdominus muscle have mildly increased, as described above.  Pelvic metastases have mildly decreased, as described above.   Original Report Authenticated By: Charline Bills, M.D.    Ct Abdomen Pelvis W Contrast  05/01/2012  *RADIOLOGY REPORT*  Clinical Data:  Metastatic pancreatic cancer status post Whipple, abdominal pain, evaluate for PE  CT ANGIOGRAPHY CHEST CT ABDOMEN AND PELVIS WITH CONTRAST  Technique:  Multidetector CT imaging of the chest was performed using the standard protocol during bolus administration of intravenous contrast.  Multiplanar CT image reconstructions including MIPs were obtained to evaluate the vascular anatomy. Multidetector CT imaging of the abdomen and pelvis was performed using the standard protocol during bolus administration of intravenous contrast.  Contrast: OMNIPAQUE IOHEXOL 350 MG/ML SOLN  Comparison:  CT abdomen pelvis dated 02/23/2012.  CT chest dated 01/31/2011.  CTA CHEST  Findings:  No evidence of pulmonary embolism.  No suspicious pulmonary nodules.  Mild paraseptal emphysematous changes.  Biapical pleural parenchymal scarring.  Minimal dependent  atelectasis in the bilateral lower lobes.  No pleural effusion or pneumothorax.  Visualized thyroid is unremarkable.  The heart is normal in size.  No pericardial effusion.  Small mediastinal and left axillary lymph nodes which do not meet pathologic CT size criteria.  No suspicious hilar lymphadenopathy.  Right chest port.  Degenerative changes of the visualized thoracolumbar spine.   Review of the MIP images confirms the above findings.  IMPRESSION: No evidence of pulmonary embolism.  No evidence of metastatic disease in the chest.  Mild paraseptal emphysematous changes.  CT ABDOMEN AND PELVIS  Findings: Postsurgical changes related to Whipple procedure. Residual pancreatic body and tail are unremarkable.  Patent gastrojejunostomy.  No evidence of bowel obstruction.  Gallbladder is surgical surgically absent.  Associated pneumobilia. No intrahepatic or extrahepatic ductal dilatation.  No suspicious/enhancing hepatic lesions.  4.0 x 2.5 cm peritoneal implant along the right hepatic dome (series 5/image 11), previously 4.1 x 1.9 cm, mildly increased.  Peritoneal implant along the inferior right  hepatic lobe measures 7 mm short axis (series 5/image 34), previously 9 mm.  Spleen and adrenal glands are within normal limits.  Kidneys are within normal limits.  No hydronephrosis.  Atherosclerotic calcifications of the abdominal aorta and branch vessels.  No abdominopelvic ascites.  Pelvic metastases have mildly decreased.  Right pelvic implant measures 2.8 x 1.9 cm (series 5/image 73), previously 3.3 x 2.1 cm. Left pelvic can plan measures 2.2 x 3.5 cm (series 5/image 74), previously 2.3 x 3.6 cm.  Prostate is unremarkable.  Bladder is within normal limits.  3.2 x 4.4 cm metastasis in the right rectus abdominus muscle (series 5/image 44), previously 2.9 x 4.1 cm, mildly increased.  Degenerative changes of the lumbar spine.  Review of the MIP images confirms the above findings.  IMPRESSION: Status post Whipple procedure.   Metastases along the right hepatic dome and right rectus abdominus muscle have mildly increased, as described above.  Pelvic metastases have mildly decreased, as described above.   Original Report Authenticated By: Charline Bills, M.D.     EKG normal sinus rhythm normal axis no T wave abnormalities.  Assessment/Plan Right upper quadrant pain: - This most likely secondary to his metastatic pancreatic disease, CT scan of the abdomen and pelvis this showed increase in size of metastatic lesions in the rectus and the right upper lobe of the liver. I will go ahead and admit him for pain control and for observation. We'll put him on a PCA pump and Dilaudid when necessary as needed. We'll continue his OxyContin twice a dose as he is at home. We'll continue his Percocet. And will reevaluate in 2 hours his pain control.  Pancreatic cancer, s/p whipple 02/09/2010, T2N0: - Consult Dr. Truett Perna in the morning.  Hypertension: - Control continue current meds no changes were made.  Neutropenia: - Most likely secondary to chemotherapy. Afebrile continue to monitor.  Code Status: full Family Communication: none Disposition Plan: home observation status Time spent: 60 minutes  Marinda Elk [facility] Pager [phone]  If 7PM-7AM, please contact night-coverage [email or web address] Password [facility] 05/01/2012, 10:49 PM

## 2012-05-02 ENCOUNTER — Encounter: Payer: Self-pay | Admitting: *Deleted

## 2012-05-02 DIAGNOSIS — K5900 Constipation, unspecified: Secondary | ICD-10-CM

## 2012-05-02 DIAGNOSIS — C259 Malignant neoplasm of pancreas, unspecified: Secondary | ICD-10-CM

## 2012-05-02 DIAGNOSIS — R109 Unspecified abdominal pain: Secondary | ICD-10-CM

## 2012-05-02 LAB — CBC
HCT: 24.7 % — ABNORMAL LOW (ref 39.0–52.0)
Hemoglobin: 8.3 g/dL — ABNORMAL LOW (ref 13.0–17.0)
MCH: 33.2 pg (ref 26.0–34.0)
MCHC: 33.6 g/dL (ref 30.0–36.0)
MCV: 98.8 fL (ref 78.0–100.0)
RDW: 13.9 % (ref 11.5–15.5)

## 2012-05-02 MED ORDER — DIPHENHYDRAMINE HCL 12.5 MG/5ML PO ELIX: 12.5000 mg | ORAL_SOLUTION | Freq: Four times a day (QID) | ORAL | Status: DC | PRN

## 2012-05-02 MED ORDER — NALOXONE HCL 0.4 MG/ML IJ SOLN: 0.4000 mg | INTRAMUSCULAR | Status: DC | PRN

## 2012-05-02 MED ORDER — VITAMINS A & D EX OINT
TOPICAL_OINTMENT | CUTANEOUS | Status: AC
  Administered 2012-05-02: 17:00:00
  Filled 2012-05-02: qty 5

## 2012-05-02 MED ORDER — HYDROMORPHONE 0.3 MG/ML IV SOLN
INTRAVENOUS | Status: DC
  Administered 2012-05-02 (×3): via INTRAVENOUS
  Administered 2012-05-02: 13.46 mg via INTRAVENOUS
  Administered 2012-05-03: 4.99 mg via INTRAVENOUS
  Administered 2012-05-03: 8.6 mg via INTRAVENOUS
  Administered 2012-05-03 (×2): via INTRAVENOUS
  Administered 2012-05-03: 2.51 mg via INTRAVENOUS
  Filled 2012-05-02 (×5): qty 25

## 2012-05-02 MED ORDER — DOCUSATE SODIUM 100 MG PO CAPS
100.0000 mg | ORAL_CAPSULE | Freq: Two times a day (BID) | ORAL | Status: DC
  Administered 2012-05-02 – 2012-05-04 (×3): 100 mg via ORAL
  Filled 2012-05-02 (×5): qty 1

## 2012-05-02 MED ORDER — SODIUM CHLORIDE 0.9 % IJ SOLN: 9.0000 mL | INTRAMUSCULAR | Status: DC | PRN

## 2012-05-02 MED ORDER — SENNOSIDES-DOCUSATE SODIUM 8.6-50 MG PO TABS
2.0000 | ORAL_TABLET | Freq: Every day | ORAL | Status: DC
  Administered 2012-05-02: 2 via ORAL
  Filled 2012-05-02 (×3): qty 2

## 2012-05-02 MED ORDER — ONDANSETRON HCL 4 MG/2ML IJ SOLN: 4.0000 mg | Freq: Four times a day (QID) | INTRAMUSCULAR | Status: DC | PRN

## 2012-05-02 MED ORDER — DIPHENHYDRAMINE HCL 50 MG/ML IJ SOLN: 12.5000 mg | Freq: Four times a day (QID) | INTRAMUSCULAR | Status: DC | PRN

## 2012-05-02 MED ORDER — HYDROMORPHONE 0.3 MG/ML IV SOLN: INTRAVENOUS | Status: DC

## 2012-05-02 MED ORDER — OXYCODONE HCL ER 40 MG PO T12A
40.0000 mg | EXTENDED_RELEASE_TABLET | Freq: Three times a day (TID) | ORAL | Status: DC
  Administered 2012-05-02 – 2012-05-03 (×3): 40 mg via ORAL
  Filled 2012-05-02 (×3): qty 1

## 2012-05-02 NOTE — ED Provider Notes (Signed)
Medical screening examination/treatment/procedure(s) were performed by non-physician practitioner and as supervising physician I was immediately available for consultation/collaboration.   Lyanne Co, MD 05/02/12 [phone]

## 2012-05-02 NOTE — Progress Notes (Signed)
[facility] PROGRESS NOTE  [name] VHQ:[id number] DOB: 01-02-1951 DOA: 05/01/2012 PCP: Sheila Oats, MD  Past medical history of pancreatic adenocarcinoma status post Whipple disease, currently on chemotherapy at [facility]. Patient reports for the past 5 days he has had persistent pain to his right abdomen. Describes pain as a throbbing and pressure sensation, constant, worse with palpation, with associated shortness of breath on exertion. He has been taking his home pain medication without relief. He is unable to manage his pains and felt that he will need to come to the hospital for further management of his pain.  Assessment/Plan: 1. Stage IV pancreatic cancer progressing through chemotherapy - patient admitted for pain control. Oxycontin 40 mg increased to every 8 hours on 05/02/12. Dilaudid PCA pump customized for higher breakthrough medication amount on 05/02/12. Bowel regimen prescribed also on 05/02/12   Code Status: full Family Communication: patient  Disposition Plan: home   Consultants:  Oncology    HPI/Subjective: C/o 10/10 RUQ abd pain   Objective: Filed Vitals:   05/02/12 0000 05/02/12 0200 05/02/12 0400 05/02/12 0550  BP:  154/81  163/90  Pulse:  93  87  Temp:  98.2 F (36.8 C)  98.1 F (36.7 C)  TempSrc:  Oral  Oral  Resp: 16 18 14 11   Height:      Weight:      SpO2: 100% 100% 97% 99%    Intake/Output Summary (Last 24 hours) at 05/02/12 0835 Last data filed at 05/02/12 0648  Gross per 24 hour  Intake    680 ml  Output   2200 ml  Net  -1520 ml   Filed Weights   05/01/12 2325  Weight: 67.5 kg (148 lb 13 oz)    Exam:   General:  Anxious, pressured speech, uncomfortable   Cardiovascular: tachy , reg  Respiratory: ctab  Abdomen: tender ruq  Data Reviewed: Basic Metabolic Panel:  Lab 05/01/12 4132  NA 137  K 4.0  CL 105  CO2 26  GLUCOSE 83  BUN 13  CREATININE 0.80  CALCIUM 8.7  MG --  PHOS --   Liver Function  Tests: No results found for this basename: AST:5,ALT:5,ALKPHOS:5,BILITOT:5,PROT:5,ALBUMIN:5 in the last 168 hours No results found for this basename: LIPASE:5,AMYLASE:5 in the last 168 hours No results found for this basename: AMMONIA:5 in the last 168 hours CBC:  Lab 05/02/12 0515 05/01/12 1920  WBC 2.0* 2.6*  NEUTROABS -- 1.9  HGB 8.3* 8.7*  HCT 24.7* 25.9*  MCV 98.8 98.9  PLT 65* 66*   Cardiac Enzymes: No results found for this basename: CKTOTAL:5,CKMB:5,CKMBINDEX:5,TROPONINI:5 in the last 168 hours BNP (last 3 results) No results found for this basename: PROBNP:3 in the last 8760 hours CBG: No results found for this basename: GLUCAP:5 in the last 168 hours  No results found for this or any previous visit (from the past 240 hour(s)).   Studies: Dg Chest 2 View  05/01/2012  *RADIOLOGY REPORT*  Clinical Data: Short of breath.  Cough.  Abdominal pain. Pancreatic carcinoma.  CHEST - 2 VIEW  Comparison: 02/23/2012  Findings: Heart size and mediastinal contours are normal.  Both lungs are clear.  No evidence of pleural effusion.  No mass or lymphadenopathy identified.  Right-sided power port remains in appropriate position.  IMPRESSION: No active cardiopulmonary disease.   Original Report Authenticated By: Myles Rosenthal, M.D.    Ct Angio Chest Pe W/cm &/or Wo Cm  05/01/2012  *RADIOLOGY REPORT*  Clinical Data:  Metastatic pancreatic cancer status post  Whipple, abdominal pain, evaluate for PE  CT ANGIOGRAPHY CHEST CT ABDOMEN AND PELVIS WITH CONTRAST  Technique:  Multidetector CT imaging of the chest was performed using the standard protocol during bolus administration of intravenous contrast.  Multiplanar CT image reconstructions including MIPs were obtained to evaluate the vascular anatomy. Multidetector CT imaging of the abdomen and pelvis was performed using the standard protocol during bolus administration of intravenous contrast.  Contrast: OMNIPAQUE IOHEXOL 350 MG/ML SOLN   Comparison:  CT abdomen pelvis dated 02/23/2012.  CT chest dated 01/31/2011.  CTA CHEST  Findings:  No evidence of pulmonary embolism.  No suspicious pulmonary nodules.  Mild paraseptal emphysematous changes.  Biapical pleural parenchymal scarring.  Minimal dependent atelectasis in the bilateral lower lobes.  No pleural effusion or pneumothorax.  Visualized thyroid is unremarkable.  The heart is normal in size.  No pericardial effusion.  Small mediastinal and left axillary lymph nodes which do not meet pathologic CT size criteria.  No suspicious hilar lymphadenopathy.  Right chest port.  Degenerative changes of the visualized thoracolumbar spine.   Review of the MIP images confirms the above findings.  IMPRESSION: No evidence of pulmonary embolism.  No evidence of metastatic disease in the chest.  Mild paraseptal emphysematous changes.  CT ABDOMEN AND PELVIS  Findings: Postsurgical changes related to Whipple procedure. Residual pancreatic body and tail are unremarkable.  Patent gastrojejunostomy.  No evidence of bowel obstruction.  Gallbladder is surgical surgically absent.  Associated pneumobilia. No intrahepatic or extrahepatic ductal dilatation.  No suspicious/enhancing hepatic lesions.  4.0 x 2.5 cm peritoneal implant along the right hepatic dome (series 5/image 11), previously 4.1 x 1.9 cm, mildly increased.  Peritoneal implant along the inferior right hepatic lobe measures 7 mm short axis (series 5/image 34), previously 9 mm.  Spleen and adrenal glands are within normal limits.  Kidneys are within normal limits.  No hydronephrosis.  Atherosclerotic calcifications of the abdominal aorta and branch vessels.  No abdominopelvic ascites.  Pelvic metastases have mildly decreased.  Right pelvic implant measures 2.8 x 1.9 cm (series 5/image 73), previously 3.3 x 2.1 cm. Left pelvic can plan measures 2.2 x 3.5 cm (series 5/image 74), previously 2.3 x 3.6 cm.  Prostate is unremarkable.  Bladder is within normal limits.   3.2 x 4.4 cm metastasis in the right rectus abdominus muscle (series 5/image 44), previously 2.9 x 4.1 cm, mildly increased.  Degenerative changes of the lumbar spine.  Review of the MIP images confirms the above findings.  IMPRESSION: Status post Whipple procedure.  Metastases along the right hepatic dome and right rectus abdominus muscle have mildly increased, as described above.  Pelvic metastases have mildly decreased, as described above.   Original Report Authenticated By: Charline Bills, M.D.    Ct Abdomen Pelvis W Contrast  05/01/2012  *RADIOLOGY REPORT*  Clinical Data:  Metastatic pancreatic cancer status post Whipple, abdominal pain, evaluate for PE  CT ANGIOGRAPHY CHEST CT ABDOMEN AND PELVIS WITH CONTRAST  Technique:  Multidetector CT imaging of the chest was performed using the standard protocol during bolus administration of intravenous contrast.  Multiplanar CT image reconstructions including MIPs were obtained to evaluate the vascular anatomy. Multidetector CT imaging of the abdomen and pelvis was performed using the standard protocol during bolus administration of intravenous contrast.  Contrast: OMNIPAQUE IOHEXOL 350 MG/ML SOLN  Comparison:  CT abdomen pelvis dated 02/23/2012.  CT chest dated 01/31/2011.  CTA CHEST  Findings:  No evidence of pulmonary embolism.  No suspicious pulmonary nodules.  Mild paraseptal emphysematous changes.  Biapical pleural parenchymal scarring.  Minimal dependent atelectasis in the bilateral lower lobes.  No pleural effusion or pneumothorax.  Visualized thyroid is unremarkable.  The heart is normal in size.  No pericardial effusion.  Small mediastinal and left axillary lymph nodes which do not meet pathologic CT size criteria.  No suspicious hilar lymphadenopathy.  Right chest port.  Degenerative changes of the visualized thoracolumbar spine.   Review of the MIP images confirms the above findings.  IMPRESSION: No evidence of pulmonary embolism.  No evidence of  metastatic disease in the chest.  Mild paraseptal emphysematous changes.  CT ABDOMEN AND PELVIS  Findings: Postsurgical changes related to Whipple procedure. Residual pancreatic body and tail are unremarkable.  Patent gastrojejunostomy.  No evidence of bowel obstruction.  Gallbladder is surgical surgically absent.  Associated pneumobilia. No intrahepatic or extrahepatic ductal dilatation.  No suspicious/enhancing hepatic lesions.  4.0 x 2.5 cm peritoneal implant along the right hepatic dome (series 5/image 11), previously 4.1 x 1.9 cm, mildly increased.  Peritoneal implant along the inferior right hepatic lobe measures 7 mm short axis (series 5/image 34), previously 9 mm.  Spleen and adrenal glands are within normal limits.  Kidneys are within normal limits.  No hydronephrosis.  Atherosclerotic calcifications of the abdominal aorta and branch vessels.  No abdominopelvic ascites.  Pelvic metastases have mildly decreased.  Right pelvic implant measures 2.8 x 1.9 cm (series 5/image 73), previously 3.3 x 2.1 cm. Left pelvic can plan measures 2.2 x 3.5 cm (series 5/image 74), previously 2.3 x 3.6 cm.  Prostate is unremarkable.  Bladder is within normal limits.  3.2 x 4.4 cm metastasis in the right rectus abdominus muscle (series 5/image 44), previously 2.9 x 4.1 cm, mildly increased.  Degenerative changes of the lumbar spine.  Review of the MIP images confirms the above findings.  IMPRESSION: Status post Whipple procedure.  Metastases along the right hepatic dome and right rectus abdominus muscle have mildly increased, as described above.  Pelvic metastases have mildly decreased, as described above.   Original Report Authenticated By: Charline Bills, M.D.     Scheduled Meds:   . heparin  5,000 Units Subcutaneous Q8H  . HYDROmorphone PCA 0.3 mg/mL   Intravenous Q4H  . metoprolol tartrate  25 mg Oral Daily  . OxyCODONE  40 mg Oral BID  . sodium chloride  3 mL Intravenous Q12H   Continuous Infusions:    Principal Problem:  *Right upper quadrant pain Active Problems:  Pancreatic cancer, s/p whipple 02/09/2010, T2N0  Hypertension  Neutropenia      Sinclair Arrazola  [facility] Pager [phone]. If 8PM-8AM, please contact night-coverage at [email or web address], password [facility] 05/02/2012, 8:35 AM  LOS: 1 day

## 2012-05-02 NOTE — Progress Notes (Signed)
Patient on custom dose pca dilaudid and gets very angry and upset when he reached his 4 hours limit and he is unable to give himself a dose. He also gets dilaudid 1 mg ivp q 2 hrs, refuses po pain med when offered.Explained to patient how the pca works but does not want to listen to explanations and yet complains that nurse is not informing him about his medications.Patient verbally abusive.

## 2012-05-02 NOTE — Progress Notes (Signed)
05/02/12 at 9:28am - The research nurse called the pt to return his call from 05/01/12.  The pt said that he was admitted to the hospital for some increased abdominal pain yesterday and just wanted to let me know about his admission.  The pt said that he was trying to rest, and that he would call the research nurse back.  The research nurse spoke to the pt's research nurse, Norm Parcel, at [facility] last week.  She confirmed that the pt's tumors have grown some but he still has "stable" disease.  The research nurse completed his follow up with this data.

## 2012-05-02 NOTE — Progress Notes (Signed)
Nutrition Brief Note  Patient identified on the Malnutrition Screening Tool (MST) Report  Body mass index is 19.63 kg/(m^2). Pt meets criteria for normal weight based on current BMI.   Current diet order is regular, patient is consuming approximately 100% of meals at this time. Labs and medications reviewed. Pt with pancreatic CA s/p Whipple procedure on chemotherapy at [facility]. Met with pt this morning who stated he is eating well with no nutritional needs. Noted pt's weight up 15 pounds since last admission 2 months ago.   No nutrition interventions warranted at this time. If nutrition issues arise, please consult RD.   Levon Hedger MS, RD, LDN [phone] Pager [phone] After Hours Pager

## 2012-05-03 ENCOUNTER — Ambulatory Visit: Payer: Medicaid Other | Admitting: Oncology

## 2012-05-03 ENCOUNTER — Other Ambulatory Visit: Payer: Medicaid Other | Admitting: Lab

## 2012-05-03 ENCOUNTER — Encounter (HOSPITAL_COMMUNITY): Payer: Self-pay | Admitting: *Deleted

## 2012-05-03 DIAGNOSIS — D61818 Other pancytopenia: Secondary | ICD-10-CM

## 2012-05-03 MED ORDER — OXYCODONE HCL ER 40 MG PO T12A
80.0000 mg | EXTENDED_RELEASE_TABLET | Freq: Three times a day (TID) | ORAL | Status: DC
  Administered 2012-05-03: 40 mg via ORAL
  Filled 2012-05-03: qty 2

## 2012-05-03 MED ORDER — HYDROMORPHONE 0.3 MG/ML IV SOLN
INTRAVENOUS | Status: DC
  Administered 2012-05-03 (×2): via INTRAVENOUS
  Administered 2012-05-03: 12.6 mg via INTRAVENOUS
  Administered 2012-05-03 – 2012-05-04 (×3): via INTRAVENOUS
  Filled 2012-05-03 (×5): qty 25

## 2012-05-03 MED ORDER — OXYCODONE HCL 10 MG PO TB12: 40.0000 mg | ORAL_TABLET | Freq: Two times a day (BID) | ORAL | Status: DC

## 2012-05-03 MED ORDER — OXYCODONE HCL 5 MG PO TABS: 10.0000 mg | ORAL_TABLET | ORAL | Status: DC | PRN

## 2012-05-03 MED ORDER — OXYCODONE HCL ER 40 MG PO T12A
40.0000 mg | EXTENDED_RELEASE_TABLET | Freq: Two times a day (BID) | ORAL | Status: DC
  Administered 2012-05-03 – 2012-05-04 (×2): 40 mg via ORAL
  Filled 2012-05-03 (×2): qty 1

## 2012-05-03 MED ORDER — OXYCODONE-ACETAMINOPHEN 5-325 MG PO TABS: 1.0000 | ORAL_TABLET | ORAL | Status: DC | PRN

## 2012-05-03 NOTE — Progress Notes (Signed)
IP PROGRESS NOTE  Subjective:   He is well-known to me with a history of metastatic pancreas cancer. He was admitted on 05/01/2012 with increased pain in the right upper lateral abdomen. He reports persistent pain and tenderness in this area. The pain is partially relieved with the current narcotic regimen. Mr. Wickham requests an increase of the OxyContin dose. He is currently being treated on a clinical trial at [facility] with gemcitabine and an immunotherapy agent.  Objective: Vital signs in last 24 hours: Blood pressure 166/96, pulse 83, temperature 98.6 F (37 C), temperature source Oral, resp. rate 12, height 6\' 1"  (1.854 m), weight 148 lb 13 oz (67.5 kg), SpO2 96.00%.  Intake/Output from previous day: 12/18 0701 - 12/19 0700 In: 1492 [P.O.:1440; IV Piggyback:52] Out: 3050 [Urine:3050]  Physical Exam:  HEENT: Neck without mass Lungs: Scattered bronchial sounds, no respiratory distress Cardiac: Regular rate and rhythm Abdomen: No hepatosplenomegaly, no apparent ascites, firm mass in the right upper abdominal wall. Tenderness to palpation at the right lateral costal margin. No mass in this area. No rash. Extremities: No leg edema Lymph nodes: No cervical, supraclavicular, or inguinal nodes. "Shotty "bilateral axillary nodes  Portacath/PICC-without erythema  Lab Results:  Basename 05/02/12 0515 05/01/12 1920  WBC 2.0* 2.6*  HGB 8.3* 8.7*  HCT 24.7* 25.9*  PLT 65* 66*    BMET  Basename 05/01/12 1920  NA 137  K 4.0  CL 105  CO2 26  GLUCOSE 83  BUN 13  CREATININE 0.80  CALCIUM 8.7    Studies/Results: Dg Chest 2 View  05/01/2012  *RADIOLOGY REPORT*  Clinical Data: Short of breath.  Cough.  Abdominal pain. Pancreatic carcinoma.  CHEST - 2 VIEW  Comparison: 02/23/2012  Findings: Heart size and mediastinal contours are normal.  Both lungs are clear.  No evidence of pleural effusion.  No mass or lymphadenopathy identified.  Right-sided power port remains in appropriate  position.  IMPRESSION: No active cardiopulmonary disease.   Original Report Authenticated By: Myles Rosenthal, M.D.    Ct Angio Chest Pe W/cm &/or Wo Cm  05/01/2012  *RADIOLOGY REPORT*  Clinical Data:  Metastatic pancreatic cancer status post Whipple, abdominal pain, evaluate for PE  CT ANGIOGRAPHY CHEST CT ABDOMEN AND PELVIS WITH CONTRAST  Technique:  Multidetector CT imaging of the chest was performed using the standard protocol during bolus administration of intravenous contrast.  Multiplanar CT image reconstructions including MIPs were obtained to evaluate the vascular anatomy. Multidetector CT imaging of the abdomen and pelvis was performed using the standard protocol during bolus administration of intravenous contrast.  Contrast: OMNIPAQUE IOHEXOL 350 MG/ML SOLN  Comparison:  CT abdomen pelvis dated 02/23/2012.  CT chest dated 01/31/2011.  CTA CHEST  Findings:  No evidence of pulmonary embolism.  No suspicious pulmonary nodules.  Mild paraseptal emphysematous changes.  Biapical pleural parenchymal scarring.  Minimal dependent atelectasis in the bilateral lower lobes.  No pleural effusion or pneumothorax.  Visualized thyroid is unremarkable.  The heart is normal in size.  No pericardial effusion.  Small mediastinal and left axillary lymph nodes which do not meet pathologic CT size criteria.  No suspicious hilar lymphadenopathy.  Right chest port.  Degenerative changes of the visualized thoracolumbar spine.   Review of the MIP images confirms the above findings.  IMPRESSION: No evidence of pulmonary embolism.  No evidence of metastatic disease in the chest.  Mild paraseptal emphysematous changes.  CT ABDOMEN AND PELVIS  Findings: Postsurgical changes related to Whipple procedure. Residual pancreatic body and  tail are unremarkable.  Patent gastrojejunostomy.  No evidence of bowel obstruction.  Gallbladder is surgical surgically absent.  Associated pneumobilia. No intrahepatic or extrahepatic ductal  dilatation.  No suspicious/enhancing hepatic lesions.  4.0 x 2.5 cm peritoneal implant along the right hepatic dome (series 5/image 11), previously 4.1 x 1.9 cm, mildly increased.  Peritoneal implant along the inferior right hepatic lobe measures 7 mm short axis (series 5/image 34), previously 9 mm.  Spleen and adrenal glands are within normal limits.  Kidneys are within normal limits.  No hydronephrosis.  Atherosclerotic calcifications of the abdominal aorta and branch vessels.  No abdominopelvic ascites.  Pelvic metastases have mildly decreased.  Right pelvic implant measures 2.8 x 1.9 cm (series 5/image 73), previously 3.3 x 2.1 cm. Left pelvic can plan measures 2.2 x 3.5 cm (series 5/image 74), previously 2.3 x 3.6 cm.  Prostate is unremarkable.  Bladder is within normal limits.  3.2 x 4.4 cm metastasis in the right rectus abdominus muscle (series 5/image 44), previously 2.9 x 4.1 cm, mildly increased.  Degenerative changes of the lumbar spine.  Review of the MIP images confirms the above findings.  IMPRESSION: Status post Whipple procedure.  Metastases along the right hepatic dome and right rectus abdominus muscle have mildly increased, as described above.  Pelvic metastases have mildly decreased, as described above.   Original Report Authenticated By: Charline Bills, M.D.    Ct Abdomen Pelvis W Contrast  05/01/2012  *RADIOLOGY REPORT*  Clinical Data:  Metastatic pancreatic cancer status post Whipple, abdominal pain, evaluate for PE  CT ANGIOGRAPHY CHEST CT ABDOMEN AND PELVIS WITH CONTRAST  Technique:  Multidetector CT imaging of the chest was performed using the standard protocol during bolus administration of intravenous contrast.  Multiplanar CT image reconstructions including MIPs were obtained to evaluate the vascular anatomy. Multidetector CT imaging of the abdomen and pelvis was performed using the standard protocol during bolus administration of intravenous contrast.  Contrast: OMNIPAQUE  IOHEXOL 350 MG/ML SOLN  Comparison:  CT abdomen pelvis dated 02/23/2012.  CT chest dated 01/31/2011.  CTA CHEST  Findings:  No evidence of pulmonary embolism.  No suspicious pulmonary nodules.  Mild paraseptal emphysematous changes.  Biapical pleural parenchymal scarring.  Minimal dependent atelectasis in the bilateral lower lobes.  No pleural effusion or pneumothorax.  Visualized thyroid is unremarkable.  The heart is normal in size.  No pericardial effusion.  Small mediastinal and left axillary lymph nodes which do not meet pathologic CT size criteria.  No suspicious hilar lymphadenopathy.  Right chest port.  Degenerative changes of the visualized thoracolumbar spine.   Review of the MIP images confirms the above findings.  IMPRESSION: No evidence of pulmonary embolism.  No evidence of metastatic disease in the chest.  Mild paraseptal emphysematous changes.  CT ABDOMEN AND PELVIS  Findings: Postsurgical changes related to Whipple procedure. Residual pancreatic body and tail are unremarkable.  Patent gastrojejunostomy.  No evidence of bowel obstruction.  Gallbladder is surgical surgically absent.  Associated pneumobilia. No intrahepatic or extrahepatic ductal dilatation.  No suspicious/enhancing hepatic lesions.  4.0 x 2.5 cm peritoneal implant along the right hepatic dome (series 5/image 11), previously 4.1 x 1.9 cm, mildly increased.  Peritoneal implant along the inferior right hepatic lobe measures 7 mm short axis (series 5/image 34), previously 9 mm.  Spleen and adrenal glands are within normal limits.  Kidneys are within normal limits.  No hydronephrosis.  Atherosclerotic calcifications of the abdominal aorta and branch vessels.  No abdominopelvic ascites.  Pelvic  metastases have mildly decreased.  Right pelvic implant measures 2.8 x 1.9 cm (series 5/image 73), previously 3.3 x 2.1 cm. Left pelvic can plan measures 2.2 x 3.5 cm (series 5/image 74), previously 2.3 x 3.6 cm.  Prostate is unremarkable.  Bladder  is within normal limits.  3.2 x 4.4 cm metastasis in the right rectus abdominus muscle (series 5/image 44), previously 2.9 x 4.1 cm, mildly increased.  Degenerative changes of the lumbar spine.  Review of the MIP images confirms the above findings.  IMPRESSION: Status post Whipple procedure.  Metastases along the right hepatic dome and right rectus abdominus muscle have mildly increased, as described above.  Pelvic metastases have mildly decreased, as described above.   Original Report Authenticated By: Charline Bills, M.D.     Medications: I have reviewed the patient's current medications.  Assessment/Plan:  1. Metastatic pancreas cancer-currently being treated with gemcitabine and Y90 labeled [id number] on a study at [facility], initiated in February of 2013. The most recent restaging CT scans revealed stable disease.  2. Pancytopenia secondary to chemotherapy  3. Right upper abdominal pain-I reviewed the December 17 CT with a radiologist. There is a hepatic capsule lesion that appears slightly larger with evidence of hemorrhage. This most likely accounts for the acute pain. He has chronic pain related to the rectus sheath mass and pelvic metastases   The acute increase in pain may be related to hemorrhage involving the subcapsular liver lesion versus metastatic pancreas cancer not recognized on the CT.  Recommendations:  1. Wean the Dilaudid PCA to off 2. Continue OxyContin, I will decrease the OxyContin to 40 mg twice daily 3. Continue oxycodone for breakthrough pain 4. Followup as an outpatient with the oncology service at [facility] to complete a restaging evaluation as scheduled.    LOS: 2 days   Lynnea Vandervoort, Jillyn Hidden  05/03/2012, 2:23 PM

## 2012-05-03 NOTE — Progress Notes (Signed)
Spoke with pt concerning discharge needs and home health. Pt states he will not need, home health at present time.

## 2012-05-03 NOTE — Progress Notes (Signed)
[facility] PROGRESS NOTE  [name] JWJ:[id number] DOB: 26-Jul-1950 DOA: 05/01/2012 PCP: Default, Provider, MD  Past medical history of pancreatic adenocarcinoma status post Whipple disease, currently on chemotherapy at [facility]. Patient reports for the past 5 days he has had persistent pain to his right abdomen. Describes pain as a throbbing and pressure sensation, constant, worse with palpation, with associated shortness of breath on exertion. He has been taking his home pain medication without relief. He is unable to manage his pains and felt that he will need to come to the hospital for further management of his pain.  Assessment/Plan: 1. Stage IV pancreatic cancer progressing through chemotherapy - patient admitted for pain control. Oxycontin 40 mg increased to every 8 hours on 05/02/12. Dilaudid PCA pump customized for higher breakthrough medication amount on 05/02/12. Bowel regimen prescribed also on 05/02/12 Continued to experience severe pain and oxycontin increased to 80 mg TID on 05/03/12.  2. Pancytopenia - from extensive chemo  3. Pancreatic cancer on experimental chemo at [facility]  Code Status: full Family Communication: patient  Disposition Plan: home   Consultants:  Oncology    HPI/Subjective: C/o 10/10 RUQ abd pain   Objective: Filed Vitals:   05/03/12 0400 05/03/12 0615 05/03/12 0753 05/03/12 0822  BP:  152/85    Pulse:  98    Temp:  98 F (36.7 C)    TempSrc:  Oral    Resp: 18 16 11 13   Height:      Weight:      SpO2: 12% 99% 97% 98%    Intake/Output Summary (Last 24 hours) at 05/03/12 0823 Last data filed at 05/03/12 0500  Gross per 24 hour  Intake   1492 ml  Output   3050 ml  Net  -1558 ml   Filed Weights   05/01/12 2325  Weight: 67.5 kg (148 lb 13 oz)    Exam:   General:  uncomfortable   Cardiovascular: tachy , reg  Respiratory: ctab  Abdomen: tender ruq  Data Reviewed: Basic Metabolic Panel:  Lab 05/01/12 6213  NA  137  K 4.0  CL 105  CO2 26  GLUCOSE 83  BUN 13  CREATININE 0.80  CALCIUM 8.7  MG --  PHOS --   Liver Function Tests: No results found for this basename: AST:5,ALT:5,ALKPHOS:5,BILITOT:5,PROT:5,ALBUMIN:5 in the last 168 hours No results found for this basename: LIPASE:5,AMYLASE:5 in the last 168 hours No results found for this basename: AMMONIA:5 in the last 168 hours CBC:  Lab 05/02/12 0515 05/01/12 1920  WBC 2.0* 2.6*  NEUTROABS -- 1.9  HGB 8.3* 8.7*  HCT 24.7* 25.9*  MCV 98.8 98.9  PLT 65* 66*   Cardiac Enzymes: No results found for this basename: CKTOTAL:5,CKMB:5,CKMBINDEX:5,TROPONINI:5 in the last 168 hours BNP (last 3 results) No results found for this basename: PROBNP:3 in the last 8760 hours CBG: No results found for this basename: GLUCAP:5 in the last 168 hours  No results found for this or any previous visit (from the past 240 hour(s)).   Studies: Dg Chest 2 View  05/01/2012  *RADIOLOGY REPORT*  Clinical Data: Short of breath.  Cough.  Abdominal pain. Pancreatic carcinoma.  CHEST - 2 VIEW  Comparison: 02/23/2012  Findings: Heart size and mediastinal contours are normal.  Both lungs are clear.  No evidence of pleural effusion.  No mass or lymphadenopathy identified.  Right-sided power port remains in appropriate position.  IMPRESSION: No active cardiopulmonary disease.   Original Report Authenticated By: Myles Rosenthal, M.D.  Ct Angio Chest Pe W/cm &/or Wo Cm  05/01/2012  *RADIOLOGY REPORT*  Clinical Data:  Metastatic pancreatic cancer status post Whipple, abdominal pain, evaluate for PE  CT ANGIOGRAPHY CHEST CT ABDOMEN AND PELVIS WITH CONTRAST  Technique:  Multidetector CT imaging of the chest was performed using the standard protocol during bolus administration of intravenous contrast.  Multiplanar CT image reconstructions including MIPs were obtained to evaluate the vascular anatomy. Multidetector CT imaging of the abdomen and pelvis was performed using the standard  protocol during bolus administration of intravenous contrast.  Contrast: OMNIPAQUE IOHEXOL 350 MG/ML SOLN  Comparison:  CT abdomen pelvis dated 02/23/2012.  CT chest dated 01/31/2011.  CTA CHEST  Findings:  No evidence of pulmonary embolism.  No suspicious pulmonary nodules.  Mild paraseptal emphysematous changes.  Biapical pleural parenchymal scarring.  Minimal dependent atelectasis in the bilateral lower lobes.  No pleural effusion or pneumothorax.  Visualized thyroid is unremarkable.  The heart is normal in size.  No pericardial effusion.  Small mediastinal and left axillary lymph nodes which do not meet pathologic CT size criteria.  No suspicious hilar lymphadenopathy.  Right chest port.  Degenerative changes of the visualized thoracolumbar spine.   Review of the MIP images confirms the above findings.  IMPRESSION: No evidence of pulmonary embolism.  No evidence of metastatic disease in the chest.  Mild paraseptal emphysematous changes.  CT ABDOMEN AND PELVIS  Findings: Postsurgical changes related to Whipple procedure. Residual pancreatic body and tail are unremarkable.  Patent gastrojejunostomy.  No evidence of bowel obstruction.  Gallbladder is surgical surgically absent.  Associated pneumobilia. No intrahepatic or extrahepatic ductal dilatation.  No suspicious/enhancing hepatic lesions.  4.0 x 2.5 cm peritoneal implant along the right hepatic dome (series 5/image 11), previously 4.1 x 1.9 cm, mildly increased.  Peritoneal implant along the inferior right hepatic lobe measures 7 mm short axis (series 5/image 34), previously 9 mm.  Spleen and adrenal glands are within normal limits.  Kidneys are within normal limits.  No hydronephrosis.  Atherosclerotic calcifications of the abdominal aorta and branch vessels.  No abdominopelvic ascites.  Pelvic metastases have mildly decreased.  Right pelvic implant measures 2.8 x 1.9 cm (series 5/image 73), previously 3.3 x 2.1 cm. Left pelvic can plan measures 2.2 x  3.5 cm (series 5/image 74), previously 2.3 x 3.6 cm.  Prostate is unremarkable.  Bladder is within normal limits.  3.2 x 4.4 cm metastasis in the right rectus abdominus muscle (series 5/image 44), previously 2.9 x 4.1 cm, mildly increased.  Degenerative changes of the lumbar spine.  Review of the MIP images confirms the above findings.  IMPRESSION: Status post Whipple procedure.  Metastases along the right hepatic dome and right rectus abdominus muscle have mildly increased, as described above.  Pelvic metastases have mildly decreased, as described above.   Original Report Authenticated By: Charline Bills, M.D.    Ct Abdomen Pelvis W Contrast  05/01/2012  *RADIOLOGY REPORT*  Clinical Data:  Metastatic pancreatic cancer status post Whipple, abdominal pain, evaluate for PE  CT ANGIOGRAPHY CHEST CT ABDOMEN AND PELVIS WITH CONTRAST  Technique:  Multidetector CT imaging of the chest was performed using the standard protocol during bolus administration of intravenous contrast.  Multiplanar CT image reconstructions including MIPs were obtained to evaluate the vascular anatomy. Multidetector CT imaging of the abdomen and pelvis was performed using the standard protocol during bolus administration of intravenous contrast.  Contrast: OMNIPAQUE IOHEXOL 350 MG/ML SOLN  Comparison:  CT abdomen pelvis dated  02/23/2012.  CT chest dated 01/31/2011.  CTA CHEST  Findings:  No evidence of pulmonary embolism.  No suspicious pulmonary nodules.  Mild paraseptal emphysematous changes.  Biapical pleural parenchymal scarring.  Minimal dependent atelectasis in the bilateral lower lobes.  No pleural effusion or pneumothorax.  Visualized thyroid is unremarkable.  The heart is normal in size.  No pericardial effusion.  Small mediastinal and left axillary lymph nodes which do not meet pathologic CT size criteria.  No suspicious hilar lymphadenopathy.  Right chest port.  Degenerative changes of the visualized thoracolumbar spine.    Review of the MIP images confirms the above findings.  IMPRESSION: No evidence of pulmonary embolism.  No evidence of metastatic disease in the chest.  Mild paraseptal emphysematous changes.  CT ABDOMEN AND PELVIS  Findings: Postsurgical changes related to Whipple procedure. Residual pancreatic body and tail are unremarkable.  Patent gastrojejunostomy.  No evidence of bowel obstruction.  Gallbladder is surgical surgically absent.  Associated pneumobilia. No intrahepatic or extrahepatic ductal dilatation.  No suspicious/enhancing hepatic lesions.  4.0 x 2.5 cm peritoneal implant along the right hepatic dome (series 5/image 11), previously 4.1 x 1.9 cm, mildly increased.  Peritoneal implant along the inferior right hepatic lobe measures 7 mm short axis (series 5/image 34), previously 9 mm.  Spleen and adrenal glands are within normal limits.  Kidneys are within normal limits.  No hydronephrosis.  Atherosclerotic calcifications of the abdominal aorta and branch vessels.  No abdominopelvic ascites.  Pelvic metastases have mildly decreased.  Right pelvic implant measures 2.8 x 1.9 cm (series 5/image 73), previously 3.3 x 2.1 cm. Left pelvic can plan measures 2.2 x 3.5 cm (series 5/image 74), previously 2.3 x 3.6 cm.  Prostate is unremarkable.  Bladder is within normal limits.  3.2 x 4.4 cm metastasis in the right rectus abdominus muscle (series 5/image 44), previously 2.9 x 4.1 cm, mildly increased.  Degenerative changes of the lumbar spine.  Review of the MIP images confirms the above findings.  IMPRESSION: Status post Whipple procedure.  Metastases along the right hepatic dome and right rectus abdominus muscle have mildly increased, as described above.  Pelvic metastases have mildly decreased, as described above.   Original Report Authenticated By: Charline Bills, M.D.     Scheduled Meds:    . docusate sodium  100 mg Oral BID  . heparin  5,000 Units Subcutaneous Q8H  . HYDROmorphone PCA 0.3 mg/mL    Intravenous Q4H  . metoprolol tartrate  25 mg Oral Daily  . OxyCODONE  80 mg Oral Q8H  . senna-docusate  2 tablet Oral QHS  . sodium chloride  3 mL Intravenous Q12H   Continuous Infusions:   Principal Problem:  *Right upper quadrant pain Active Problems:  Pancreatic cancer, s/p whipple 02/09/2010, T2N0  Hypertension  Neutropenia      Dotty Gonzalo  [facility] Pager [phone]. If 8PM-8AM, please contact night-coverage at [email or web address], password [facility] 05/03/2012, 8:23 AM  LOS: 2 days

## 2012-05-04 ENCOUNTER — Other Ambulatory Visit: Payer: Self-pay | Admitting: Nurse Practitioner

## 2012-05-04 ENCOUNTER — Other Ambulatory Visit: Payer: Self-pay | Admitting: *Deleted

## 2012-05-04 DIAGNOSIS — C259 Malignant neoplasm of pancreas, unspecified: Secondary | ICD-10-CM

## 2012-05-04 DIAGNOSIS — G8929 Other chronic pain: Secondary | ICD-10-CM

## 2012-05-04 DIAGNOSIS — K921 Melena: Secondary | ICD-10-CM

## 2012-05-04 DIAGNOSIS — R509 Fever, unspecified: Secondary | ICD-10-CM

## 2012-05-04 DIAGNOSIS — R1901 Right upper quadrant abdominal swelling, mass and lump: Secondary | ICD-10-CM

## 2012-05-04 MED ORDER — SENNOSIDES-DOCUSATE SODIUM 8.6-50 MG PO TABS: 2.0000 | ORAL_TABLET | Freq: Every day | ORAL | Status: AC

## 2012-05-04 MED ORDER — OXYCODONE-ACETAMINOPHEN 5-325 MG PO TABS
2.0000 | ORAL_TABLET | ORAL | Status: DC | PRN
  Administered 2012-05-04: 2 via ORAL
  Filled 2012-05-04: qty 2

## 2012-05-04 MED ORDER — DSS 100 MG PO CAPS: 100.0000 mg | ORAL_CAPSULE | Freq: Two times a day (BID) | ORAL | Status: DC

## 2012-05-04 MED ORDER — HEPARIN SOD (PORK) LOCK FLUSH 100 UNIT/ML IV SOLN
INTRAVENOUS | Status: AC
  Filled 2012-05-04: qty 5

## 2012-05-04 MED ORDER — HEPARIN SOD (PORK) LOCK FLUSH 100 UNIT/ML IV SOLN: 500.0000 [IU] | INTRAVENOUS | Status: DC

## 2012-05-04 MED ORDER — OXYCODONE HCL 40 MG PO TB12: 40.0000 mg | ORAL_TABLET | Freq: Two times a day (BID) | ORAL | Status: DC

## 2012-05-04 MED ORDER — HEPARIN SOD (PORK) LOCK FLUSH 100 UNIT/ML IV SOLN: 500.0000 [IU] | INTRAVENOUS | Status: DC | PRN

## 2012-05-04 NOTE — Discharge Summary (Signed)
Physician Discharge Summary  [place] ZOX:[id number] DOB: [birth year] DOA: 05/01/2012  PCP: Does not have one   Admit date: 05/01/2012 Discharge date: 05/04/2012  Time spent: 35 minutes   Discharge Diagnoses:    *Right upper quadrant pain from metastatic pancreatic cancer   Pancreatic cancer, s/p whipple 02/09/2010, T2N0 with recurrence on salvage chemo at [facility] - progressing per CT   Hypertension  Neutropenia   Discharge Condition: fair  Diet recommendation: regular   Filed Weights   05/01/12 2325  Weight: 67.5 kg (148 lb 13 oz)    History of present illness:  Past medical history of pancreatic adenocarcinoma status post Whipple disease, currently on chemotherapy at [facility]. Patient reports for the past 5 days he has had persistent pain to his right abdomen. Describe pain as a throbbing and pressure sensation, constant, worse with palpation, with associated shortness of breath on exertion. He has been taking his home pain medication without relief. He is unable to manage his pains and felt that he will need to come to the hospital for further management of his pain. In the emergency room his gotten 6 mg of Dilaudid and 4 hours and his pain has somewhat improved but still limiting his obliquely to move around.   Hospital Course:  1. Stage Iv pancreatic cancer with new met in the liver causing pain - patient was admitted for pain control and started on Dilaudid PCA pump, he was continued on oxycontin and the dose was increased to achieve pain control. Dr. Truett Perna consulted on th case and based on patient's prior history of substance abuse he recommended we keep oxycontin at 40 mg BID. With percocet 10/325 for breakthrough pain. Patient tolerated regimen fair and agreed to be discharged on 05/04/12   Procedures: CT  Consultations:  Oncology  Discharge Exam: Filed Vitals:   05/03/12 2348 05/04/12 0243 05/04/12 0403 05/04/12 0645  BP:  164/96  156/98  Pulse:  88  112   Temp:  98.2 F (36.8 C)  97.3 F (36.3 C)  TempSrc:  Oral  Oral  Resp: 5 18 13 16   Height:      Weight:      SpO2: 96% 98% 100% 100%    General: axox3 Cardiovascular: rrr Respiratory: ctab  Discharge Instructions  Discharge Orders    Future Orders Please Complete By Expires   Diet - low sodium heart healthy      Increase activity slowly          Medication List     As of 05/04/2012  9:23 AM    TAKE these medications         DSS 100 MG Caps   Take 100 mg by mouth 2 (two) times daily.      metoprolol tartrate 25 MG tablet   Commonly known as: LOPRESSOR   Take 1 tablet (25 mg total) by mouth daily.      oxyCODONE 40 MG 12 hr tablet   Commonly known as: OXYCONTIN   Take 1 tablet (40 mg total) by mouth every 12 (twelve) hours.      oxyCODONE-acetaminophen 10-325 MG per tablet   Commonly known as: PERCOCET   Take 1 tablet by mouth every 4 (four) hours as needed. Pain      polyethylene glycol packet   Commonly known as: MIRALAX / GLYCOLAX   Take 17 g by mouth daily as needed. For constipation      prochlorperazine 10 MG tablet   Commonly known as: COMPAZINE  Take 10 mg by mouth every 6 (six) hours as needed. For nausea      senna-docusate 8.6-50 MG per tablet   Commonly known as: Senokot-S   Take 2 tablets by mouth at bedtime.           Follow-up Information    Follow up with [name], MD.   Contact information:   [address] [place] [address] [phone]           The results of significant diagnostics from this hospitalization (including imaging, microbiology, ancillary and laboratory) are listed below for reference.    Significant Diagnostic Studies: Dg Chest 2 View  05/01/2012  *RADIOLOGY REPORT*  Clinical Data: Short of breath.  Cough.  Abdominal pain. Pancreatic carcinoma.  CHEST - 2 VIEW  Comparison: 02/23/2012  Findings: Heart size and mediastinal contours are normal.  Both lungs are clear.  No evidence of pleural  effusion.  No mass or lymphadenopathy identified.  Right-sided power port remains in appropriate position.  IMPRESSION: No active cardiopulmonary disease.   Original Report Authenticated By: Myles Rosenthal, M.D.    Ct Angio Chest Pe W/cm &/or Wo Cm  05/01/2012  *RADIOLOGY REPORT*  Clinical Data:  Metastatic pancreatic cancer status post Whipple, abdominal pain, evaluate for PE  CT ANGIOGRAPHY CHEST CT ABDOMEN AND PELVIS WITH CONTRAST  Technique:  Multidetector CT imaging of the chest was performed using the standard protocol during bolus administration of intravenous contrast.  Multiplanar CT image reconstructions including MIPs were obtained to evaluate the vascular anatomy. Multidetector CT imaging of the abdomen and pelvis was performed using the standard protocol during bolus administration of intravenous contrast.  Contrast: OMNIPAQUE IOHEXOL 350 MG/ML SOLN  Comparison:  CT abdomen pelvis dated 02/23/2012.  CT chest dated 01/31/2011.  CTA CHEST  Findings:  No evidence of pulmonary embolism.  No suspicious pulmonary nodules.  Mild paraseptal emphysematous changes.  Biapical pleural parenchymal scarring.  Minimal dependent atelectasis in the bilateral lower lobes.  No pleural effusion or pneumothorax.  Visualized thyroid is unremarkable.  The heart is normal in size.  No pericardial effusion.  Small mediastinal and left axillary lymph nodes which do not meet pathologic CT size criteria.  No suspicious hilar lymphadenopathy.  Right chest port.  Degenerative changes of the visualized thoracolumbar spine.   Review of the MIP images confirms the above findings.  IMPRESSION: No evidence of pulmonary embolism.  No evidence of metastatic disease in the chest.  Mild paraseptal emphysematous changes.  CT ABDOMEN AND PELVIS  Findings: Postsurgical changes related to Whipple procedure. Residual pancreatic body and tail are unremarkable.  Patent gastrojejunostomy.  No evidence of bowel obstruction.  Gallbladder is  surgical surgically absent.  Associated pneumobilia. No intrahepatic or extrahepatic ductal dilatation.  No suspicious/enhancing hepatic lesions.  4.0 x 2.5 cm peritoneal implant along the right hepatic dome (series 5/image 11), previously 4.1 x 1.9 cm, mildly increased.  Peritoneal implant along the inferior right hepatic lobe measures 7 mm short axis (series 5/image 34), previously 9 mm.  Spleen and adrenal glands are within normal limits.  Kidneys are within normal limits.  No hydronephrosis.  Atherosclerotic calcifications of the abdominal aorta and branch vessels.  No abdominopelvic ascites.  Pelvic metastases have mildly decreased.  Right pelvic implant measures 2.8 x 1.9 cm (series 5/image 73), previously 3.3 x 2.1 cm. Left pelvic can plan measures 2.2 x 3.5 cm (series 5/image 74), previously 2.3 x 3.6 cm.  Prostate is unremarkable.  Bladder is within normal  limits.  3.2 x 4.4 cm metastasis in the right rectus abdominus muscle (series 5/image 44), previously 2.9 x 4.1 cm, mildly increased.  Degenerative changes of the lumbar spine.  Review of the MIP images confirms the above findings.  IMPRESSION: Status post Whipple procedure.  Metastases along the right hepatic dome and right rectus abdominus muscle have mildly increased, as described above.  Pelvic metastases have mildly decreased, as described above.   Original Report Authenticated By: Charline Bills, M.D.    Ct Abdomen Pelvis W Contrast  05/01/2012  *RADIOLOGY REPORT*  Clinical Data:  Metastatic pancreatic cancer status post Whipple, abdominal pain, evaluate for PE  CT ANGIOGRAPHY CHEST CT ABDOMEN AND PELVIS WITH CONTRAST  Technique:  Multidetector CT imaging of the chest was performed using the standard protocol during bolus administration of intravenous contrast.  Multiplanar CT image reconstructions including MIPs were obtained to evaluate the vascular anatomy. Multidetector CT imaging of the abdomen and pelvis was performed using the standard  protocol during bolus administration of intravenous contrast.  Contrast: OMNIPAQUE IOHEXOL 350 MG/ML SOLN  Comparison:  CT abdomen pelvis dated 02/23/2012.  CT chest dated 01/31/2011.  CTA CHEST  Findings:  No evidence of pulmonary embolism.  No suspicious pulmonary nodules.  Mild paraseptal emphysematous changes.  Biapical pleural parenchymal scarring.  Minimal dependent atelectasis in the bilateral lower lobes.  No pleural effusion or pneumothorax.  Visualized thyroid is unremarkable.  The heart is normal in size.  No pericardial effusion.  Small mediastinal and left axillary lymph nodes which do not meet pathologic CT size criteria.  No suspicious hilar lymphadenopathy.  Right chest port.  Degenerative changes of the visualized thoracolumbar spine.   Review of the MIP images confirms the above findings.  IMPRESSION: No evidence of pulmonary embolism.  No evidence of metastatic disease in the chest.  Mild paraseptal emphysematous changes.  CT ABDOMEN AND PELVIS  Findings: Postsurgical changes related to Whipple procedure. Residual pancreatic body and tail are unremarkable.  Patent gastrojejunostomy.  No evidence of bowel obstruction.  Gallbladder is surgical surgically absent.  Associated pneumobilia. No intrahepatic or extrahepatic ductal dilatation.  No suspicious/enhancing hepatic lesions.  4.0 x 2.5 cm peritoneal implant along the right hepatic dome (series 5/image 11), previously 4.1 x 1.9 cm, mildly increased.  Peritoneal implant along the inferior right hepatic lobe measures 7 mm short axis (series 5/image 34), previously 9 mm.  Spleen and adrenal glands are within normal limits.  Kidneys are within normal limits.  No hydronephrosis.  Atherosclerotic calcifications of the abdominal aorta and branch vessels.  No abdominopelvic ascites.  Pelvic metastases have mildly decreased.  Right pelvic implant measures 2.8 x 1.9 cm (series 5/image 73), previously 3.3 x 2.1 cm. Left pelvic can plan measures 2.2 x  3.5 cm (series 5/image 74), previously 2.3 x 3.6 cm.  Prostate is unremarkable.  Bladder is within normal limits.  3.2 x 4.4 cm metastasis in the right rectus abdominus muscle (series 5/image 44), previously 2.9 x 4.1 cm, mildly increased.  Degenerative changes of the lumbar spine.  Review of the MIP images confirms the above findings.  IMPRESSION: Status post Whipple procedure.  Metastases along the right hepatic dome and right rectus abdominus muscle have mildly increased, as described above.  Pelvic metastases have mildly decreased, as described above.   Original Report Authenticated By: Charline Bills, M.D.     Microbiology: No results found for this or any previous visit (from the past 240 hour(s)).   Labs: Basic Metabolic Panel:  Lab 05/01/12 1920  NA 137  K 4.0  CL 105  CO2 26  GLUCOSE 83  BUN 13  CREATININE 0.80  CALCIUM 8.7  MG --  PHOS --   Liver Function Tests: No results found for this basename: AST:5,ALT:5,ALKPHOS:5,BILITOT:5,PROT:5,ALBUMIN:5 in the last 168 hours No results found for this basename: LIPASE:5,AMYLASE:5 in the last 168 hours No results found for this basename: AMMONIA:5 in the last 168 hours CBC:  Lab 05/02/12 0515 05/01/12 1920  WBC 2.0* 2.6*  NEUTROABS -- 1.9  HGB 8.3* 8.7*  HCT 24.7* 25.9*  MCV 98.8 98.9  PLT 65* 66*   Cardiac Enzymes: No results found for this basename: CKTOTAL:5,CKMB:5,CKMBINDEX:5,TROPONINI:5 in the last 168 hours BNP: BNP (last 3 results) No results found for this basename: PROBNP:3 in the last 8760 hours CBG: No results found for this basename: GLUCAP:5 in the last 168 hours     Signed:  Aavya Shafer  [facility] 05/04/2012, 9:23 AM

## 2012-05-04 NOTE — Progress Notes (Signed)
Hospital D/C today. Needs office visit in 3 weeks with CBC per Dr. Truett Perna. Appointment ordered.

## 2012-05-04 NOTE — Progress Notes (Signed)
IP PROGRESS NOTE  Subjective:   He reports the pain is better compared to on admission. He feels ready to go home. He had a bowel movement.  Objective: Vital signs in last 24 hours: Blood pressure 156/98, pulse 112, temperature 97.3 F (36.3 C), temperature source Oral, resp. rate 16, height 6\' 1"  (1.854 m), weight 148 lb 13 oz (67.5 kg), SpO2 100.00%.  Intake/Output from previous day: 12/19 0701 - 12/20 0700 In: 2540.5 [P.O.:2040; I.V.:500.5] Out: 2550 [Urine:2550]  Physical Exam:   Lungs: Scattered bronchial sounds, no respiratory distress Cardiac: Regular rate and rhythm Abdomen: No hepatosplenomegaly, no apparent ascites, firm mass in the right upper abdominal wall. Tenderness to palpation at the right lateral costal margin. No mass in this area. No rash. Extremities: No leg edema   Portacath/PICC-without erythema  Lab Results:  Basename 05/02/12 0515 05/01/12 1920  WBC 2.0* 2.6*  HGB 8.3* 8.7*  HCT 24.7* 25.9*  PLT 65* 66*    BMET  Basename 05/01/12 1920  NA 137  K 4.0  CL 105  CO2 26  GLUCOSE 83  BUN 13  CREATININE 0.80  CALCIUM 8.7    Studies/Results: No results found.  Medications: I have reviewed the patient's current medications.  Assessment/Plan:  1. Metastatic pancreas cancer-currently being treated with gemcitabine and Y90 labeled [id number] on a study at [facility], initiated in February of 2013. The most recent restaging CT scans revealed stable disease.  2. Pancytopenia secondary to chemotherapy  3. Right upper abdominal pain-I reviewed the December 17 CT with a radiologist. There is a hepatic capsule lesion that appears slightly larger with evidence of hemorrhage. This most likely accounts for the acute pain. He has chronic pain related to the rectus sheath mass and pelvic metastases    Recommendations:  1. stop the Dilaudid PCA 2. Continue OxyContin 40 mg every 12 hours 3. Continue Percocet 10/325 4. Followup as an outpatient with the  oncology service at [facility] to complete a restaging evaluation as scheduled. 5. A followup appointment will be arranged at the [facility]  I will write the narcotic prescriptions.    LOS: 3 days   [name]  05/04/2012, 7:53 AM

## 2012-05-04 NOTE — Progress Notes (Signed)
During discharge, pt's health status had not changed from initial shift assessment. Pt's blood pressure was 179/96. Reported to Dr. Lavera Guise and continued to monitor pt after administering scheduled metoprolol. Deaccessed pt's port. Saline and heparin flushed pt before deaccessing. Completed discharge education with pt. He did not have further questions afterwards.

## 2012-05-04 NOTE — Telephone Encounter (Signed)
Per Dr. Truett Perna, patient will be discharged today on Oxycontin 40 mg every 12 hours and his previous Percocet. Will pick up scripts from clinic upon discharge.

## 2012-05-05 NOTE — ED Provider Notes (Signed)
Medical screening examination/treatment/procedure(s) were performed by non-physician practitioner and as supervising physician I was immediately available for consultation/collaboration.  Raeford Razor, MD 05/05/12 [phone]

## 2012-05-07 ENCOUNTER — Telehealth: Payer: Self-pay | Admitting: Oncology

## 2012-05-07 NOTE — Telephone Encounter (Signed)
Pt is aware of appt on 05/25/11 lab and MD

## 2012-05-10 ENCOUNTER — Other Ambulatory Visit: Payer: Self-pay | Admitting: *Deleted

## 2012-05-10 DIAGNOSIS — C259 Malignant neoplasm of pancreas, unspecified: Secondary | ICD-10-CM

## 2012-05-10 MED ORDER — OXYCODONE-ACETAMINOPHEN 10-325 MG PO TABS: 1.0000 | ORAL_TABLET | ORAL | Status: DC | PRN

## 2012-05-10 NOTE — Telephone Encounter (Signed)
Left VM requesting to pick up his Percocet script today.

## 2012-05-17 ENCOUNTER — Other Ambulatory Visit: Payer: Self-pay | Admitting: *Deleted

## 2012-05-17 DIAGNOSIS — C259 Malignant neoplasm of pancreas, unspecified: Secondary | ICD-10-CM

## 2012-05-17 MED ORDER — OXYCODONE-ACETAMINOPHEN 10-325 MG PO TABS: 1.0000 | ORAL_TABLET | ORAL | Status: DC | PRN

## 2012-05-24 ENCOUNTER — Other Ambulatory Visit (HOSPITAL_BASED_OUTPATIENT_CLINIC_OR_DEPARTMENT_OTHER): Payer: Medicaid Other | Admitting: Lab

## 2012-05-24 ENCOUNTER — Ambulatory Visit (HOSPITAL_BASED_OUTPATIENT_CLINIC_OR_DEPARTMENT_OTHER): Payer: Medicaid Other | Admitting: Oncology

## 2012-05-24 ENCOUNTER — Telehealth: Payer: Self-pay | Admitting: Oncology

## 2012-05-24 VITALS — BP 156/80 | HR 76 | Temp 98.7°F | Resp 18 | Ht 73.0 in | Wt 150.9 lb

## 2012-05-24 DIAGNOSIS — D702 Other drug-induced agranulocytosis: Secondary | ICD-10-CM

## 2012-05-24 DIAGNOSIS — C7A1 Malignant poorly differentiated neuroendocrine tumors: Secondary | ICD-10-CM

## 2012-05-24 DIAGNOSIS — C7B8 Other secondary neuroendocrine tumors: Secondary | ICD-10-CM

## 2012-05-24 DIAGNOSIS — C259 Malignant neoplasm of pancreas, unspecified: Secondary | ICD-10-CM

## 2012-05-24 LAB — CBC WITH DIFFERENTIAL/PLATELET
Eosinophils Absolute: 0 10*3/uL (ref 0.0–0.5)
HCT: 29.2 % — ABNORMAL LOW (ref 38.4–49.9)
LYMPH%: 13.5 % — ABNORMAL LOW (ref 14.0–49.0)
MCHC: 32.9 g/dL (ref 32.0–36.0)
MCV: 99 fL — ABNORMAL HIGH (ref 79.3–98.0)
MONO#: 0.8 10*3/uL (ref 0.1–0.9)
MONO%: 24.2 % — ABNORMAL HIGH (ref 0.0–14.0)
NEUT#: 1.9 10*3/uL (ref 1.5–6.5)
NEUT%: 61.1 % (ref 39.0–75.0)
Platelets: 51 10*3/uL — ABNORMAL LOW (ref 140–400)
WBC: 3.1 10*3/uL — ABNORMAL LOW (ref 4.0–10.3)

## 2012-05-24 MED ORDER — OXYCODONE-ACETAMINOPHEN 10-325 MG PO TABS: 1.0000 | ORAL_TABLET | ORAL | Status: DC | PRN

## 2012-05-24 NOTE — Telephone Encounter (Signed)
s.w. pt and advised on 2.6 appt....pt already aware

## 2012-05-24 NOTE — Progress Notes (Signed)
[facility]    OFFICE PROGRESS NOTE   INTERVAL HISTORY:   He returns as scheduled. He was admitted 05/01/2012 with increased pain at the right upper abdomen/costal margin. The pain has partially improved with an increased dose of OxyContin. He continues to have pain in this area and surrounding the rectus mass. He complains of intermittent headaches for the past several days. No associated symptoms. The headaches are partially relieved with Tylenol.  [name] is followed closely at [facility] and continues treatment on protocol. He reports being scheduled for laboratory studies at [facility] to the next 2 Fridays prior to a restaging CT evaluation.  Objective:  Vital signs in last 24 hours:  Blood pressure 156/80, pulse 76, temperature 98.7 F (37.1 C), temperature source Oral, resp. rate 18, height 6\' 1"  (1.854 m), weight 150 lb 14.4 oz (68.448 kg).    HEENT: No thrush or ulcers Resp: Lungs clear bilaterally Cardio: Regular rate and rhythm GI: There is a mass at the right abdominal wall measuring approximately 5 cm. No hepatosplenomegaly. Tenderness at the right lateral costal margin without a palpable mass Vascular: No leg edema   Portacath/PICC-without erythema  Lab Results:  Lab Results  Component Value Date   WBC 3.1* 05/24/2012   HGB 9.6* 05/24/2012   HCT 29.2* 05/24/2012   MCV 99.0* 05/24/2012   PLT 51* 05/24/2012      Medications: I have reviewed the patient's current medications.  Assessment/Plan: 1. 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 Histology and immunohistochemical profile were consistent with a poorly-differentiated tumor with neuroendocrine and hepatoid features. The CA19-9 tumor marker was normal at 15.7 on 12/03/2010, and the CEA was normal at 1.7.  a. 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 b. Staging PET scan confirmed increased FDG activity associated with the right rectus sheath mass and multiple peritoneal metastases.  c. Initiation of gemcitabine chemotherapy on the [facility] study 12/21/2010: He completed day 1 of cycle #2 on 01/18/2011.  d. Restating CT evaluation 01/31/2011 was consistent with disease progression.  e. Initiation of FOLFOX chemotherapy with cycle #1 given on 03/09/2011. He completed cycle 2 on 03/30/2011. He completed cycle 3 on 04/20/2011 with Neulasta support. f. Pelvic CT 05/11/2011 showed multiple lesions around the liver and in the anterior upper abdominal wall had decreased in size; central pelvic lesions had progressed substantially in the interval and were generating prominent mass effect on the rectosigmoid junction. g. Currently being treated on a clinical trial at [facility] with gemcitabine and Y90 labeled antiMUC-1hPAM, initiated February 2013-most recent restaging CT scan revealed stable disease 2. Pain secondary to the rectus sheath mass and pelvic/peritoneal metastases. He continues OxyContin with Percocet as needed. Increased pain at the right subcostal area December 2013-? Related to a hepatic capsule lesion a. He completed palliative radiation to the abdominal rectus sheath mass on 02/21/2011. b. He completed palliative radiation to the pelvis on 06/01/2011 3. 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 4. Remote history of heroin/cocaine use. 5. Ongoing tobacco use. 6. Port-A-Cath placement 01/06/2011. 7. History of neutropenia secondary to chemotherapy: He received Neulasta support after day 15/cycle #1 gemcitabine chemotherapy. Neulasta was added beginning with cycle 3 FOLFOX. 8. Low back pain when here 05/04/2011. Dr. Rayburn Ma felt the source of pain may be due to the large disc herniation at the right side at L4/L5. An  epidural steroid injection was done 05/21/2011.        9.  Hypertension. On  metoprolol   Disposition:  He appears stable. He will continue treatment/followup per the [facility] study. He continues OxyContin/oxycodone for pain. [name] will return for an office visit on 06/22/2011. He will contact us if the headaches do not improve.  [name] Papas, MD  05/24/2012  2:20 PM

## 2012-05-30 ENCOUNTER — Other Ambulatory Visit: Payer: Self-pay | Admitting: *Deleted

## 2012-05-30 DIAGNOSIS — C259 Malignant neoplasm of pancreas, unspecified: Secondary | ICD-10-CM

## 2012-05-30 MED ORDER — OXYCODONE HCL 40 MG PO TB12: 40.0000 mg | ORAL_TABLET | Freq: Two times a day (BID) | ORAL | Status: DC

## 2012-05-30 MED ORDER — OXYCODONE-ACETAMINOPHEN 10-325 MG PO TABS: 1.0000 | ORAL_TABLET | ORAL | Status: DC | PRN

## 2012-06-07 ENCOUNTER — Telehealth: Payer: Self-pay | Admitting: *Deleted

## 2012-06-07 DIAGNOSIS — C259 Malignant neoplasm of pancreas, unspecified: Secondary | ICD-10-CM

## 2012-06-07 MED ORDER — OXYCODONE-ACETAMINOPHEN 10-325 MG PO TABS: 1.0000 | ORAL_TABLET | ORAL | Status: DC | PRN

## 2012-06-07 NOTE — Telephone Encounter (Signed)
Message from pt requesting refill on Oxycodone. Rx left in prescription book to be picked up.

## 2012-06-14 ENCOUNTER — Other Ambulatory Visit: Payer: Self-pay | Admitting: *Deleted

## 2012-06-14 DIAGNOSIS — C259 Malignant neoplasm of pancreas, unspecified: Secondary | ICD-10-CM

## 2012-06-14 MED ORDER — OXYCODONE-ACETAMINOPHEN 10-325 MG PO TABS: 1.0000 | ORAL_TABLET | ORAL | Status: DC | PRN

## 2012-06-14 NOTE — Telephone Encounter (Signed)
Called for refill. Will pick up tomorrow.

## 2012-06-19 ENCOUNTER — Telehealth: Payer: Self-pay | Admitting: *Deleted

## 2012-06-19 ENCOUNTER — Telehealth: Payer: Self-pay | Admitting: Oncology

## 2012-06-19 NOTE — Telephone Encounter (Signed)
Call from pt requesting to reschedule 06/21/12 office visit. Will be in [place] for chemo 2/5-2/7. Requests Fri afternoon or Mon/Tues next week. Will need to pick up pain rx Fri afternoon. Reviewed with Dr. Truett Perna, order sent to schedulers.

## 2012-06-19 NOTE — Telephone Encounter (Signed)
s/w  the pt of his 06/21/2012 appt that has been moved to 06/26/2012@9 :30am per md ords

## 2012-06-21 ENCOUNTER — Ambulatory Visit: Payer: Medicaid Other | Admitting: Oncology

## 2012-06-22 ENCOUNTER — Other Ambulatory Visit: Payer: Self-pay | Admitting: *Deleted

## 2012-06-22 DIAGNOSIS — C259 Malignant neoplasm of pancreas, unspecified: Secondary | ICD-10-CM

## 2012-06-22 MED ORDER — OXYCODONE-ACETAMINOPHEN 10-325 MG PO TABS: 1.0000 | ORAL_TABLET | ORAL | Status: DC | PRN

## 2012-06-26 ENCOUNTER — Telehealth: Payer: Self-pay | Admitting: Oncology

## 2012-06-26 ENCOUNTER — Ambulatory Visit (HOSPITAL_BASED_OUTPATIENT_CLINIC_OR_DEPARTMENT_OTHER): Payer: Medicaid Other | Admitting: Oncology

## 2012-06-26 ENCOUNTER — Ambulatory Visit (HOSPITAL_BASED_OUTPATIENT_CLINIC_OR_DEPARTMENT_OTHER): Payer: Medicaid Other

## 2012-06-26 VITALS — BP 140/85 | HR 93 | Temp 97.3°F | Resp 18 | Ht 73.0 in | Wt 149.6 lb

## 2012-06-26 DIAGNOSIS — C7A1 Malignant poorly differentiated neuroendocrine tumors: Secondary | ICD-10-CM

## 2012-06-26 DIAGNOSIS — C259 Malignant neoplasm of pancreas, unspecified: Secondary | ICD-10-CM

## 2012-06-26 DIAGNOSIS — C7B8 Other secondary neuroendocrine tumors: Secondary | ICD-10-CM

## 2012-06-26 DIAGNOSIS — D696 Thrombocytopenia, unspecified: Secondary | ICD-10-CM

## 2012-06-26 LAB — CBC WITH DIFFERENTIAL/PLATELET
Basophils Absolute: 0 10*3/uL (ref 0.0–0.1)
EOS%: 2.6 % (ref 0.0–7.0)
HCT: 27.2 % — ABNORMAL LOW (ref 38.4–49.9)
HGB: 9.1 g/dL — ABNORMAL LOW (ref 13.0–17.1)
LYMPH%: 17.8 % (ref 14.0–49.0)
MCH: 32.5 pg (ref 27.2–33.4)
MCHC: 33.5 g/dL (ref 32.0–36.0)
MCV: 97.1 fL (ref 79.3–98.0)
MONO%: 7.2 % (ref 0.0–14.0)
NEUT%: 71.7 % (ref 39.0–75.0)

## 2012-06-26 MED ORDER — OXYCODONE-ACETAMINOPHEN 5-325 MG PO TABS
2.0000 | ORAL_TABLET | Freq: Once | ORAL | Status: AC
  Administered 2012-06-26: 2 via ORAL

## 2012-06-26 NOTE — Telephone Encounter (Signed)
Talked to pt and gave him appt date for March 2014

## 2012-06-26 NOTE — Progress Notes (Signed)
[name]    OFFICE PROGRESS NOTE   INTERVAL HISTORY:   He returns as scheduled. He continues treatment on the clinical trial at [facility]. [name] reports stable abdominal pain, chiefly in the right upper abdomen. Good appetite. No difficulty with bowel function. No pain outside of the abdomen.  Objective:  Vital signs in last 24 hours:  Blood pressure 140/85, pulse 93, temperature 97.3 F (36.3 C), temperature source Oral, resp. rate 18, height 6\' 1"  (1.854 m), weight 149 lb 9.6 oz (67.858 kg).    HEENT: No thrush or ulcers Lymphatics: No cervical or supraclavicular nodes. "Shotty "axillary nodes. Resp: Lungs clear bilaterally Cardio: Regular rate and rhythm GI: 4-5 cm right upper abdominal wall mass, no hepatomegaly, no apparent ascites Vascular: No leg edema   Portacath/PICC-without erythema  Lab Results:  Lab Results  Component Value Date   WBC 1.5* 06/26/2012   HGB 9.1* 06/26/2012   HCT 27.2* 06/26/2012   MCV 97.1 06/26/2012   PLT 36* 06/26/2012   ANC 1.1  Medications: I have reviewed the patient's current medications.  Assessment/Plan:  1. 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 Histology and immunohistochemical profile were consistent with a poorly-differentiated tumor with neuroendocrine and hepatoid features. The CA19-9 tumor marker was normal at 15.7 on 12/03/2010, and the CEA was normal at 1.7.  a. 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 b. Staging PET scan confirmed increased FDG activity associated with the right rectus sheath mass and multiple peritoneal metastases.  c. Initiation of gemcitabine chemotherapy on the Onconova study 12/21/2010: He completed day 1 of cycle #2 on 01/18/2011.  d. Restating CT evaluation 01/31/2011 was  consistent with disease progression.  e. Initiation of FOLFOX chemotherapy with cycle #1 given on 03/09/2011. He completed cycle 2 on 03/30/2011. He completed cycle 3 on 04/20/2011 with Neulasta support. f. Pelvic CT 05/11/2011 showed multiple lesions around the liver and in the anterior upper abdominal wall had decreased in size; central pelvic lesions had progressed substantially in the interval and were generating prominent mass effect on the rectosigmoid junction. g. Currently being treated on a clinical trial at [facility] with gemcitabine and Y90 labeled [id number], initiated February 2013-most recent restaging CT scan revealed stable disease 2. Pain secondary to the rectus sheath mass and pelvic/peritoneal metastases. He continues OxyContin with Percocet as needed. Increased pain at the right subcostal area December 2013-? Related to a hepatic capsule lesion a. He completed palliative radiation to the abdominal rectus sheath mass on 02/21/2011. b. He completed palliative radiation to the pelvis on 06/01/2011 3. 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 4. Remote history of heroin/cocaine use. 5. Ongoing tobacco use. 6. Port-A-Cath placement 01/06/2011. 7. History of neutropenia secondary to chemotherapy: He received Neulasta support after day 15/cycle #1 gemcitabine chemotherapy. Neulasta was added beginning with cycle 3 FOLFOX. Neutropenia today secondary to the study chemotherapy to 8. Low back pain when here 05/04/2011. Dr. Rayburn Ma felt the source of pain may be due to the large disc herniation at the right side at L4/L5. An epidural steroid injection was done 05/21/2011.        9. Hypertension. On metoprolol       10. Thrombocytopenia secondary chemotherapy     Disposition:  [name] appears stable. We will forward today's CBC to [facility]. He is scheduled for followup at [facility] later this  week. He will return for an office visit here in 6 weeks. We will see him sooner as needed. We continue to  refill the OxyContin/Percocet.  [name], MD  06/26/2012  2:10 PM

## 2012-06-28 ENCOUNTER — Other Ambulatory Visit: Payer: Self-pay | Admitting: *Deleted

## 2012-06-28 DIAGNOSIS — C259 Malignant neoplasm of pancreas, unspecified: Secondary | ICD-10-CM

## 2012-06-28 MED ORDER — OXYCONTIN 40 MG PO T12A: 40.0000 mg | EXTENDED_RELEASE_TABLET | Freq: Two times a day (BID) | ORAL | Status: DC

## 2012-06-28 MED ORDER — OXYCODONE-ACETAMINOPHEN 10-325 MG PO TABS: 1.0000 | ORAL_TABLET | ORAL | Status: DC | PRN

## 2012-06-28 NOTE — Telephone Encounter (Signed)
Patient called to request refills on his pain medications and requests "brand name" Oxycontin reporting it is more effective than generic. Made him aware that MD can order this, but his Medicaid may not cover it.

## 2012-06-29 ENCOUNTER — Encounter: Payer: Self-pay | Admitting: *Deleted

## 2012-06-29 ENCOUNTER — Encounter: Payer: Self-pay | Admitting: Oncology

## 2012-06-29 NOTE — Progress Notes (Signed)
RECEIVED A FAX FROM [facility] CONCERNING A PRIOR AUTHORIZATION FOR OXYCONTIN. THIS REQUEST WAS PLACED IN THE MANAGED CARE BIN.

## 2012-06-29 NOTE — Progress Notes (Signed)
Faxed PA form for oxycontin 40mg   to [employer].

## 2012-07-05 ENCOUNTER — Other Ambulatory Visit: Payer: Self-pay | Admitting: *Deleted

## 2012-07-05 DIAGNOSIS — C259 Malignant neoplasm of pancreas, unspecified: Secondary | ICD-10-CM

## 2012-07-05 MED ORDER — OXYCODONE-ACETAMINOPHEN 10-325 MG PO TABS: 1.0000 | ORAL_TABLET | ORAL | Status: DC | PRN

## 2012-07-05 NOTE — Telephone Encounter (Signed)
Called to request refill on percocet.

## 2012-07-09 ENCOUNTER — Telehealth: Payer: Self-pay | Admitting: *Deleted

## 2012-07-09 ENCOUNTER — Other Ambulatory Visit: Payer: Self-pay | Admitting: *Deleted

## 2012-07-09 NOTE — Telephone Encounter (Signed)
Message from pt reporting increasing abdominal pain. Feels a mass in his RUQ. Reviewed with Dr. Truett Perna, order received to schedule pt for office visit this week. Called pt with appt for 2/26 at 1100. He voiced understanding. POF to scheduler.

## 2012-07-11 ENCOUNTER — Ambulatory Visit (HOSPITAL_BASED_OUTPATIENT_CLINIC_OR_DEPARTMENT_OTHER): Payer: Medicaid Other | Admitting: Oncology

## 2012-07-11 ENCOUNTER — Other Ambulatory Visit: Payer: Self-pay | Admitting: *Deleted

## 2012-07-11 VITALS — BP 166/81 | HR 78 | Temp 97.5°F | Resp 18 | Ht 73.0 in | Wt 148.4 lb

## 2012-07-11 DIAGNOSIS — C7B8 Other secondary neuroendocrine tumors: Secondary | ICD-10-CM

## 2012-07-11 DIAGNOSIS — R109 Unspecified abdominal pain: Secondary | ICD-10-CM

## 2012-07-11 DIAGNOSIS — C7A1 Malignant poorly differentiated neuroendocrine tumors: Secondary | ICD-10-CM

## 2012-07-11 DIAGNOSIS — C259 Malignant neoplasm of pancreas, unspecified: Secondary | ICD-10-CM

## 2012-07-11 DIAGNOSIS — R1011 Right upper quadrant pain: Secondary | ICD-10-CM

## 2012-07-11 MED ORDER — MORPHINE SULFATE 15 MG PO TABS
15.0000 mg | ORAL_TABLET | Freq: Once | ORAL | Status: AC
  Administered 2012-07-11: 15 mg via ORAL

## 2012-07-11 MED ORDER — OXYCODONE-ACETAMINOPHEN 10-325 MG PO TABS: 1.0000 | ORAL_TABLET | ORAL | Status: DC | PRN

## 2012-07-11 NOTE — Progress Notes (Signed)
[facility]    OFFICE PROGRESS NOTE   INTERVAL HISTORY:   He returns for an unscheduled visit. He remains on the clinical trial [facility] and is scheduled for a restaging CT evaluation 07/25/2012. [name] complains of persistent pain in the right upper abdomen. The pain is partially relieved with Percocet. He reports the palpable mass at the right abdominal wall changes in size. He is not sure whether the pain is related to the palpable mass or something else. No other complaint.  Objective:  Vital signs in last 24 hours:  Blood pressure 166/81, pulse 78, temperature 97.5 F (36.4 C), temperature source Oral, resp. rate 18, height 6\' 1"  (1.854 m), weight 148 lb 6.4 oz (67.314 kg).    HEENT: No thrush or ulcers Resp: Lungs clear bilaterally Cardio: Regular rate and rhythm GI: No hepatomegaly, right upper abdominal wall mass with mild associated tenderness. He is uncooperative on exam. Vascular: No leg edema  Portacath/PICC-without erythema    Medications: I have reviewed the patient's current medications.  Assessment/Plan: 1. 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 Histology and immunohistochemical profile were consistent with a poorly-differentiated tumor with neuroendocrine and hepatoid features. The CA19-9 tumor marker was normal at 15.7 on 12/03/2010, and the CEA was normal at 1.7.  a. 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 b. Staging PET scan confirmed increased FDG activity associated with the right rectus sheath mass and multiple peritoneal metastases.  c. Initiation of gemcitabine chemotherapy on the Onconova study 12/21/2010: He completed day 1 of cycle #2 on 01/18/2011.  d. Restating CT evaluation 01/31/2011 was consistent with disease  progression.  e. Initiation of FOLFOX chemotherapy with cycle #1 given on 03/09/2011. He completed cycle 2 on 03/30/2011. He completed cycle 3 on 04/20/2011 with Neulasta support. f. Pelvic CT 05/11/2011 showed multiple lesions around the liver and in the anterior upper abdominal wall had decreased in size; central pelvic lesions had progressed substantially in the interval and were generating prominent mass effect on the rectosigmoid junction. g. Currently being treated on a clinical trial at [facility] with gemcitabine and Y90 labeled [id number], initiated February 2013-most recent restaging CT scan revealed stable disease 2. Pain secondary to the rectus sheath mass and pelvic/peritoneal metastases. He continues OxyContin with Percocet as needed. Increased pain at the right subcostal area December 2013-? Related to a hepatic capsule lesion a. He completed palliative radiation to the abdominal rectus sheath mass on 02/21/2011. b. He completed palliative radiation to the pelvis on 06/01/2011 3. 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 4. Remote history of heroin/cocaine use. 5. Ongoing tobacco use. 6. Port-A-Cath placement 01/06/2011. 7. History of neutropenia secondary to chemotherapy: He received Neulasta support after day 15/cycle #1 gemcitabine chemotherapy. Neulasta was added beginning with cycle 3 FOLFOX. Neutropenia today secondary to the study chemotherapy to 8. Low back pain when here 05/04/2011. Dr. Rayburn Ma felt the source of pain may be due to the large disc herniation at the right side at L4/L5. An epidural steroid injection was done 05/21/2011.        9. Hypertension. On metoprolol       10. History of Thrombocytopenia secondary chemotherapy    Disposition:  He presents for an unscheduled visit. He complains of pain in the right upper abdomen. It is not clear whether his pain is related to the abdominal wall mass  or a hepatic capsule lesion. He became very agitated with questioning and  examination today as I tried to delineate the site of his pain.  He is scheduled for a restaging evaluation at [facility] within the next few weeks. We will therefore not order a CT here. He will try MSIR (15 mg) for pain today. If this helps we will discontinue Percocet and switch to [facility] for breakthrough pain.  Mr. [name] will return for an office visit as scheduled next month. We will see him sooner as needed.   [name], MD  07/11/2012  12:07 PM

## 2012-07-11 NOTE — Progress Notes (Addendum)
1211 Pt given MSIR 15mg  X 1 po in office for c/o of abdominal pain; pt states pain is "5" on pain scale.  1245 Asked pt if any pain relief after taking MSIR---pt states "the same, no better, no worse"  When asked to rate pain on pain scale, pt refused.  Per Dr. Truett Perna, when asked whether he wanted the [facility] or the percocet for pain relief, pt stated "I want to stick with the Percocet"  New script given in office for percocet with instruction to be fill 07/12/12.

## 2012-07-12 ENCOUNTER — Telehealth: Payer: Self-pay | Admitting: Oncology

## 2012-07-12 NOTE — Telephone Encounter (Signed)
Talked to pt, he is aware of appt on 3/27, MD only

## 2012-07-18 ENCOUNTER — Other Ambulatory Visit: Payer: Self-pay | Admitting: *Deleted

## 2012-07-18 DIAGNOSIS — C259 Malignant neoplasm of pancreas, unspecified: Secondary | ICD-10-CM

## 2012-07-18 MED ORDER — OXYCODONE-ACETAMINOPHEN 10-325 MG PO TABS: 1.0000 | ORAL_TABLET | ORAL | Status: DC | PRN

## 2012-07-18 NOTE — Telephone Encounter (Signed)
Left VM requesting refill on his Percocet and Oxycontin. Needs Oxycontin early because he has had more pain and has needed to take more. Oxycontin refill not due until 3/13 (week early). Will print script for MD to sign Percocet and inquire how he wishes to handle the early refill request.

## 2012-07-26 ENCOUNTER — Other Ambulatory Visit: Payer: Self-pay | Admitting: *Deleted

## 2012-07-26 DIAGNOSIS — C259 Malignant neoplasm of pancreas, unspecified: Secondary | ICD-10-CM

## 2012-07-26 MED ORDER — OXYCODONE-ACETAMINOPHEN 10-325 MG PO TABS: 1.0000 | ORAL_TABLET | ORAL | Status: DC | PRN

## 2012-07-26 MED ORDER — OXYCONTIN 40 MG PO T12A: 40.0000 mg | EXTENDED_RELEASE_TABLET | Freq: Two times a day (BID) | ORAL | Status: DC

## 2012-07-26 NOTE — Telephone Encounter (Signed)
Left VM requesting refill on his pain meds. 

## 2012-08-02 ENCOUNTER — Other Ambulatory Visit: Payer: Self-pay | Admitting: *Deleted

## 2012-08-02 DIAGNOSIS — C259 Malignant neoplasm of pancreas, unspecified: Secondary | ICD-10-CM

## 2012-08-02 MED ORDER — OXYCODONE-ACETAMINOPHEN 10-325 MG PO TABS: 1.0000 | ORAL_TABLET | ORAL | Status: DC | PRN

## 2012-08-02 NOTE — Telephone Encounter (Signed)
Called to request refill on Percocet.

## 2012-08-09 ENCOUNTER — Encounter: Payer: Self-pay | Admitting: *Deleted

## 2012-08-09 ENCOUNTER — Ambulatory Visit: Payer: Medicaid Other | Admitting: Oncology

## 2012-08-09 NOTE — Progress Notes (Signed)
08/09/12 at 10:20am - The pt was scheduled to be seen by Dr. Truett Perna this am in [place].  The research nurse contacted the pt's [facility] research nurse, Lizabeth Leyden, to obtain his most recent CT scan for Dr. Truett Perna to review.  Lizabeth Leyden, research nurse, stated that the pt was actually in [place] receiving his treatment.  She faxed a copy of his most recent CT scan from 07/25/12.  She said that he remains on the same study.  His CT report was given to Kenney Houseman, Dr. Kalman Drape nurse.  Kenney Houseman stated the pt did leave her a message stating he would not make his appt in [place].  The Onconova 04-22 study is requesting monthly follow updates on surviving patients.  His next follow up will be due in April 2014.

## 2012-08-10 ENCOUNTER — Other Ambulatory Visit: Payer: Self-pay | Admitting: *Deleted

## 2012-08-10 DIAGNOSIS — C259 Malignant neoplasm of pancreas, unspecified: Secondary | ICD-10-CM

## 2012-08-10 MED ORDER — OXYCODONE-ACETAMINOPHEN 10-325 MG PO TABS: 1.0000 | ORAL_TABLET | ORAL | Status: DC | PRN

## 2012-08-13 ENCOUNTER — Other Ambulatory Visit: Payer: Self-pay | Admitting: *Deleted

## 2012-08-14 ENCOUNTER — Telehealth: Payer: Self-pay | Admitting: Oncology

## 2012-08-14 NOTE — Telephone Encounter (Signed)
S/w pt re appt for 4/10 @ 10:30 am.

## 2012-08-16 ENCOUNTER — Other Ambulatory Visit: Payer: Self-pay | Admitting: *Deleted

## 2012-08-16 DIAGNOSIS — C259 Malignant neoplasm of pancreas, unspecified: Secondary | ICD-10-CM

## 2012-08-16 MED ORDER — METOPROLOL TARTRATE 25 MG PO TABS: ORAL_TABLET | ORAL | Status: DC

## 2012-08-16 MED ORDER — OXYCODONE-ACETAMINOPHEN 10-325 MG PO TABS: 1.0000 | ORAL_TABLET | ORAL | Status: DC | PRN

## 2012-08-22 ENCOUNTER — Telehealth: Payer: Self-pay | Admitting: *Deleted

## 2012-08-22 NOTE — Telephone Encounter (Signed)
Call from pt requesting refill on pain medication. Pt is aware of 24 hour notice requirement for refills. He is scheduled to see MD on 4/10. Informed pt prescription will be available 08/23/12.

## 2012-08-23 ENCOUNTER — Ambulatory Visit (HOSPITAL_BASED_OUTPATIENT_CLINIC_OR_DEPARTMENT_OTHER): Payer: Medicaid Other | Admitting: Oncology

## 2012-08-23 VITALS — BP 119/73 | HR 87 | Temp 97.3°F | Resp 18 | Ht 73.0 in | Wt 150.2 lb

## 2012-08-23 DIAGNOSIS — C259 Malignant neoplasm of pancreas, unspecified: Secondary | ICD-10-CM

## 2012-08-23 DIAGNOSIS — L989 Disorder of the skin and subcutaneous tissue, unspecified: Secondary | ICD-10-CM

## 2012-08-23 MED ORDER — OXYCONTIN 40 MG PO T12A: 40.0000 mg | EXTENDED_RELEASE_TABLET | Freq: Two times a day (BID) | ORAL | Status: DC

## 2012-08-23 MED ORDER — OXYCODONE-ACETAMINOPHEN 10-325 MG PO TABS: 1.0000 | ORAL_TABLET | ORAL | Status: DC | PRN

## 2012-08-23 NOTE — Progress Notes (Signed)
[facility]    OFFICE PROGRESS NOTE   INTERVAL HISTORY:   He returns as scheduled. He continues treatment on protocol at [facility]. A recent restaging CT evaluation reveals stable disease. Graft Mr. Splitt continues to have pain in the upper abdomen. The pain is partially relieved with OxyContin and Percocet. He complains of dry skin and he has noted several ulcerated lesions over the legs and back. No known insect bite.  Objective:  Vital signs in last 24 hours:  Blood pressure 119/73, pulse 87, temperature 97.3 F (36.3 C), temperature source Oral, resp. rate 18, height 6\' 1"  (1.854 m), weight 150 lb 3.2 oz (68.13 kg).    HEENT: No thrush or ulcers Resp: Lungs clear bilaterally Cardio: Regular rate and rhythm GI: No hepatomegaly, mass in the right upper abdominal wall, tenderness at the right subcostal region, no apparent ascites Vascular: No leg edema  Skin: At the mid upper back and posterior left lower leg there are 2-3 mm round superficial ulcerations. Dry skin at the back   Portacath/PICC-without erythema  Lab Results: None today  Medications: I have reviewed the patient's current medications.  Assessment/Plan: 1. 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 Histology and immunohistochemical profile were consistent with a poorly-differentiated tumor with neuroendocrine and hepatoid features. The CA19-9 tumor marker was normal at 15.7 on 12/03/2010, and the CEA was normal at 1.7.  a. 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 b. Staging PET scan confirmed increased FDG activity associated with the right rectus sheath mass and multiple peritoneal metastases.  c. Initiation of gemcitabine chemotherapy on the Onconova study 12/21/2010: He completed day 1 of cycle  #2 on 01/18/2011.  d. Restating CT evaluation 01/31/2011 was consistent with disease progression.  e. Initiation of FOLFOX chemotherapy with cycle #1 given on 03/09/2011. He completed cycle 2 on 03/30/2011. He completed cycle 3 on 04/20/2011 with Neulasta support. f. Pelvic CT 05/11/2011 showed multiple lesions around the liver and in the anterior upper abdominal wall had decreased in size; central pelvic lesions had progressed substantially in the interval and were generating prominent mass effect on the rectosigmoid junction. g. Currently being treated on a clinical trial at [facility] with gemcitabine and Y90 labeled [id number]-1hPAM, initiated February 2013-most recent restaging CT scan revealed stable disease 2. Pain secondary to the rectus sheath mass and pelvic/peritoneal metastases. He continues OxyContin with Percocet as needed. Increased pain at the right subcostal area December 2013-? Related to a hepatic capsule lesion a. He completed palliative radiation to the abdominal rectus sheath mass on 02/21/2011. b. He completed palliative radiation to the pelvis on 06/01/2011 3. 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 4. Remote history of heroin/cocaine use. 5. Ongoing tobacco use. 6. Port-A-Cath placement 01/06/2011. 7. History of neutropenia secondary to chemotherapy: He received Neulasta support after day 15/cycle #1 gemcitabine chemotherapy. Neulasta was added beginning with cycle 3 FOLFOX. 8. Low back pain when here 05/04/2011. Dr. Rayburn Ma felt the source of pain may be due to the large disc herniation at the right side at L4/L5. An epidural steroid injection was done 05/21/2011.  9. Hypertension. On metoprolol  10. History of Thrombocytopenia secondary chemotherapy -the CBC is followed at [facility] 11. Small superficial skin ulcers-? Etiology. He requested a dermatology referral   Disposition:  He appears stable. He will continue systemic therapy and laboratory followup at [facility]. We  will make a dermatology  referral to evaluate the skin lesions. [name] will return for an office visit after the next restaging evaluation at [facility]. We refilled the OxyContin and Percocet today.   [name], MD  08/23/2012  7:17 PM

## 2012-08-24 ENCOUNTER — Telehealth: Payer: Self-pay | Admitting: *Deleted

## 2012-08-24 NOTE — Telephone Encounter (Signed)
sw pt before i could give appt d/t he stated today is not the day. After waiting for him to calm down . i gv the appt for 09/20/12 his reply was he might come and he might not. i exampled to him that [facility] Dermatologist need for [facility] to do his referral. He started back yelling and fussing. After i finally calmed him down in he caught on to my voice and name he continued to say sorry, but he informed me that he will get Kendell Bane to do his referral and not to worry about it.

## 2012-08-29 ENCOUNTER — Other Ambulatory Visit: Payer: Self-pay | Admitting: *Deleted

## 2012-08-29 DIAGNOSIS — C259 Malignant neoplasm of pancreas, unspecified: Secondary | ICD-10-CM

## 2012-08-29 DIAGNOSIS — L989 Disorder of the skin and subcutaneous tissue, unspecified: Secondary | ICD-10-CM

## 2012-08-29 MED ORDER — OXYCODONE-ACETAMINOPHEN 10-325 MG PO TABS: 1.0000 | ORAL_TABLET | ORAL | Status: DC | PRN

## 2012-09-06 ENCOUNTER — Other Ambulatory Visit: Payer: Self-pay | Admitting: *Deleted

## 2012-09-06 DIAGNOSIS — L989 Disorder of the skin and subcutaneous tissue, unspecified: Secondary | ICD-10-CM

## 2012-09-06 DIAGNOSIS — C259 Malignant neoplasm of pancreas, unspecified: Secondary | ICD-10-CM

## 2012-09-06 MED ORDER — OXYCODONE-ACETAMINOPHEN 10-325 MG PO TABS: 1.0000 | ORAL_TABLET | ORAL | Status: DC | PRN

## 2012-09-06 NOTE — Telephone Encounter (Signed)
Message from pt requesting refill on Oxycodone. Rx will be left in prescription book for pick up.  

## 2012-09-14 ENCOUNTER — Encounter: Payer: Self-pay | Admitting: *Deleted

## 2012-09-14 NOTE — Progress Notes (Signed)
Dr. Truett Perna spoke with [facility] physician: Patient has progressed on study and will come off study, but still wants to continue treatment at [facility]. [facility] also agrees to take over and manage his pain medications.

## 2012-09-20 ENCOUNTER — Ambulatory Visit: Payer: Medicaid Other | Admitting: Oncology

## 2012-10-01 ENCOUNTER — Telehealth: Payer: Self-pay | Admitting: *Deleted

## 2012-10-01 NOTE — Telephone Encounter (Signed)
Patient presents to office without appointment and enters the physician area looking to see physician about "swollen feet".  @1309  Unable to locate patient in sub wait, lobby or lab area.

## 2012-10-24 ENCOUNTER — Encounter: Payer: Self-pay | Admitting: *Deleted

## 2012-10-24 NOTE — Progress Notes (Signed)
10/24/12 at 2:25pm - Onconova 04-22 monthly survival follow up call - The research nurse contacted the pt since he has no regular scheduled appointments at [facility].   He is receiving all of his care now at [facility].  The pt said that he is doing "okay" with his new treatment.  He said that he started his new chemo on 09/28/12.  He said that he gets his chemo on Fridays, and his pump comes off on Sundays.  He said that he has increased fatigue, and he is not able to walk outside as much as he had done in the past.  The nurse informed the pt that the study still wants to know how he is doing monthly.  The pt said that the nurse could call him anytime.

## 2012-11-02 ENCOUNTER — Other Ambulatory Visit: Payer: Self-pay | Admitting: *Deleted

## 2012-11-05 ENCOUNTER — Other Ambulatory Visit: Payer: Self-pay | Admitting: *Deleted

## 2012-11-05 ENCOUNTER — Other Ambulatory Visit: Payer: Self-pay | Admitting: Oncology

## 2012-11-05 ENCOUNTER — Ambulatory Visit (HOSPITAL_BASED_OUTPATIENT_CLINIC_OR_DEPARTMENT_OTHER): Payer: Medicaid Other

## 2012-11-05 VITALS — BP 135/85 | HR 81 | Temp 99.0°F

## 2012-11-05 DIAGNOSIS — C7B8 Other secondary neuroendocrine tumors: Secondary | ICD-10-CM

## 2012-11-05 DIAGNOSIS — C7A1 Malignant poorly differentiated neuroendocrine tumors: Secondary | ICD-10-CM

## 2012-11-05 DIAGNOSIS — C259 Malignant neoplasm of pancreas, unspecified: Secondary | ICD-10-CM

## 2012-11-05 MED ORDER — PEGFILGRASTIM INJECTION 6 MG/0.6ML
6.0000 mg | Freq: Once | SUBCUTANEOUS | Status: AC
  Administered 2012-11-05: 6 mg via SUBCUTANEOUS
  Filled 2012-11-05: qty 0.6

## 2012-11-06 ENCOUNTER — Other Ambulatory Visit: Payer: Self-pay | Admitting: *Deleted

## 2012-11-06 ENCOUNTER — Telehealth: Payer: Self-pay | Admitting: *Deleted

## 2012-11-06 NOTE — Progress Notes (Signed)
POF to scheduler for neulasta 7/7 7/21 & 8/4. MD to place supportive orders.

## 2012-11-06 NOTE — Telephone Encounter (Signed)
sw pt and tried to give him his appt d/t for his inj. Pt cut my conversation and stated that he do not have a pink pen to write with and put his appts in the mail.i will mail a letter/avs today.Marland Kitchentd

## 2012-11-19 ENCOUNTER — Ambulatory Visit (HOSPITAL_BASED_OUTPATIENT_CLINIC_OR_DEPARTMENT_OTHER): Payer: Medicaid Other

## 2012-11-19 ENCOUNTER — Encounter: Payer: Self-pay | Admitting: *Deleted

## 2012-11-19 VITALS — BP 125/93 | HR 76 | Resp 18

## 2012-11-19 DIAGNOSIS — D709 Neutropenia, unspecified: Secondary | ICD-10-CM

## 2012-11-19 DIAGNOSIS — C7A1 Malignant poorly differentiated neuroendocrine tumors: Secondary | ICD-10-CM

## 2012-11-19 DIAGNOSIS — C787 Secondary malignant neoplasm of liver and intrahepatic bile duct: Secondary | ICD-10-CM

## 2012-11-19 MED ORDER — PEGFILGRASTIM INJECTION 6 MG/0.6ML
6.0000 mg | Freq: Once | SUBCUTANEOUS | Status: AC
  Administered 2012-11-19: 6 mg via SUBCUTANEOUS
  Filled 2012-11-19: qty 0.6

## 2012-11-19 NOTE — Progress Notes (Signed)
Pt in for Neulasta injection. He reports he disconnected his Folfirinox pump on 11/17/12. Chemotherapy is administered at [facility]. Pt will receive Neulasta support in this office per Dr. Truett Perna.

## 2012-11-19 NOTE — Progress Notes (Signed)
11/19/12 at 2:35pm-  Onconova 04-22 follow up note- The pt was into the cancer center this afternoon for his neulasta injection.  He is still receiving his treatments at [facility].  He states that he is tolerating his chemotherapy, but he does report  increased fatigue.  He states that he has another cycle next week, and then he will have re-staging scans.  The pt said that he is just taking it "day by day".  He said that he knows that his body is "wearing out".  He said that he will keep "fighting on for as long as he can".  The research nurse gave the pt some emotional support.  The pt said that he will make sure that our cancer center gets a copy of his next scans.  The pt was thanked for his support of the study.

## 2012-12-02 ENCOUNTER — Other Ambulatory Visit: Payer: Self-pay | Admitting: Oncology

## 2012-12-03 ENCOUNTER — Ambulatory Visit: Payer: Medicaid Other

## 2012-12-07 ENCOUNTER — Other Ambulatory Visit: Payer: Self-pay | Admitting: *Deleted

## 2012-12-07 DIAGNOSIS — C259 Malignant neoplasm of pancreas, unspecified: Secondary | ICD-10-CM

## 2012-12-07 MED ORDER — METOPROLOL TARTRATE 25 MG PO TABS: ORAL_TABLET | ORAL | Status: DC

## 2012-12-17 ENCOUNTER — Ambulatory Visit: Payer: Medicaid Other

## 2013-01-16 ENCOUNTER — Telehealth: Payer: Self-pay | Admitting: *Deleted

## 2013-01-16 DIAGNOSIS — C259 Malignant neoplasm of pancreas, unspecified: Secondary | ICD-10-CM

## 2013-01-16 NOTE — Telephone Encounter (Signed)
Called pt, offered him appt to see MD tomorrow. Pt stated he can't get to the office and he is not coming up here to wait. Encouraged pt to keep appt, let MD evaluate him and offer options. Pt stated he needs to be in the hospital for a few days. Instructed him to go to ED if he feels he needs to be admitted.

## 2013-01-16 NOTE — Telephone Encounter (Signed)
01/16/13 at 2:12pm - The pt called and spoke to the research nurse. He said that he has been "off chemo for 4-5 weeks now".  The pt said that he could no longer take the treatments because his "counts stayed too low".  The pt said that he feels that he may need a transfusion because he is so weak.  He states that he is also in pain.  The pt reported that he is hurting "all over".  The nurse encouraged the pt to consider hospice and palliative care support.  The pt initially refused hospice.  He said that he did not want "anyone staying right near him all of the time".  The nurse explained that hospice would be there for his needs and his pain control.  The pt said that he feels that he needs to stay in the hospital "for a couple of days".   The pt stated that he did not want to go to the "ED and then be sent home".  The nurse stated that he needs to be evaluated either here at the cancer center or the hospital.  If the pt is admitted, then a referral can be placed for a palliative care consult. The nurse told the pt that she needed to speak to Dr. Truett Perna about his status.  The nurse spoke to Dr. Kalman Drape nurse, Archie Patten, about the pt and his current status.  She said that she would discuss the pt with Dr. Truett Perna.  The research nurse offered to call the pt about his treatment plan.

## 2013-01-17 ENCOUNTER — Telehealth: Payer: Self-pay

## 2013-01-17 ENCOUNTER — Other Ambulatory Visit: Payer: Medicaid Other | Admitting: Lab

## 2013-01-17 ENCOUNTER — Encounter (HOSPITAL_COMMUNITY): Payer: Self-pay

## 2013-01-17 ENCOUNTER — Ambulatory Visit: Payer: Medicaid Other | Admitting: Oncology

## 2013-01-17 ENCOUNTER — Inpatient Hospital Stay (HOSPITAL_COMMUNITY)
Admission: EM | Admit: 2013-01-17 | Discharge: 2013-01-18 | DRG: 812 | Disposition: A | Discharge status: Home / Self Care | Payer: Medicaid Other | Attending: Internal Medicine | Admitting: Internal Medicine

## 2013-01-17 DIAGNOSIS — T4272XA Poisoning by unspecified antiepileptic and sedative-hypnotic drugs, intentional self-harm, initial encounter: Secondary | ICD-10-CM | POA: Diagnosis present

## 2013-01-17 DIAGNOSIS — D6481 Anemia due to antineoplastic chemotherapy: Principal | ICD-10-CM | POA: Diagnosis present

## 2013-01-17 DIAGNOSIS — Z923 Personal history of irradiation: Secondary | ICD-10-CM

## 2013-01-17 DIAGNOSIS — R55 Syncope and collapse: Secondary | ICD-10-CM

## 2013-01-17 DIAGNOSIS — M5106 Intervertebral disc disorders with myelopathy, lumbar region: Secondary | ICD-10-CM

## 2013-01-17 DIAGNOSIS — R1011 Right upper quadrant pain: Secondary | ICD-10-CM

## 2013-01-17 DIAGNOSIS — D709 Neutropenia, unspecified: Secondary | ICD-10-CM

## 2013-01-17 DIAGNOSIS — K5900 Constipation, unspecified: Secondary | ICD-10-CM

## 2013-01-17 DIAGNOSIS — D649 Anemia, unspecified: Secondary | ICD-10-CM

## 2013-01-17 DIAGNOSIS — Z79899 Other long term (current) drug therapy: Secondary | ICD-10-CM

## 2013-01-17 DIAGNOSIS — F17200 Nicotine dependence, unspecified, uncomplicated: Secondary | ICD-10-CM | POA: Diagnosis present

## 2013-01-17 DIAGNOSIS — Z90411 Acquired partial absence of pancreas: Secondary | ICD-10-CM

## 2013-01-17 DIAGNOSIS — R109 Unspecified abdominal pain: Secondary | ICD-10-CM

## 2013-01-17 DIAGNOSIS — Z9221 Personal history of antineoplastic chemotherapy: Secondary | ICD-10-CM

## 2013-01-17 DIAGNOSIS — D638 Anemia in other chronic diseases classified elsewhere: Secondary | ICD-10-CM | POA: Diagnosis present

## 2013-01-17 DIAGNOSIS — I10 Essential (primary) hypertension: Secondary | ICD-10-CM

## 2013-01-17 DIAGNOSIS — T4271XA Poisoning by unspecified antiepileptic and sedative-hypnotic drugs, accidental (unintentional), initial encounter: Secondary | ICD-10-CM | POA: Diagnosis present

## 2013-01-17 DIAGNOSIS — M545 Low back pain, unspecified: Secondary | ICD-10-CM

## 2013-01-17 DIAGNOSIS — C259 Malignant neoplasm of pancreas, unspecified: Secondary | ICD-10-CM

## 2013-01-17 DIAGNOSIS — K921 Melena: Secondary | ICD-10-CM

## 2013-01-17 DIAGNOSIS — T426X2A Poisoning by other antiepileptic and sedative-hypnotic drugs, intentional self-harm, initial encounter: Secondary | ICD-10-CM | POA: Diagnosis present

## 2013-01-17 DIAGNOSIS — R509 Fever, unspecified: Secondary | ICD-10-CM

## 2013-01-17 LAB — URINALYSIS, ROUTINE W REFLEX MICROSCOPIC
Bilirubin Urine: NEGATIVE
Glucose, UA: NEGATIVE mg/dL
Ketones, ur: NEGATIVE mg/dL
Leukocytes, UA: NEGATIVE
Nitrite: NEGATIVE
Specific Gravity, Urine: 1.017 (ref 1.005–1.030)
pH: 7 (ref 5.0–8.0)

## 2013-01-17 LAB — CBC WITH DIFFERENTIAL/PLATELET
Basophils Absolute: 0 10*3/uL (ref 0.0–0.1)
Eosinophils Absolute: 0 10*3/uL (ref 0.0–0.7)
HCT: 22.3 % — ABNORMAL LOW (ref 39.0–52.0)
Lymphocytes Relative: 34 % (ref 12–46)
MCHC: 31.8 g/dL (ref 30.0–36.0)
Monocytes Relative: 23 % — ABNORMAL HIGH (ref 3–12)
Neutrophils Relative %: 42 % — ABNORMAL LOW (ref 43–77)
Platelets: 194 10*3/uL (ref 150–400)
RDW: 17.1 % — ABNORMAL HIGH (ref 11.5–15.5)
WBC: 2.2 10*3/uL — ABNORMAL LOW (ref 4.0–10.5)

## 2013-01-17 LAB — COMPREHENSIVE METABOLIC PANEL
AST: 33 U/L (ref 0–37)
Albumin: 1.7 g/dL — ABNORMAL LOW (ref 3.5–5.2)
Alkaline Phosphatase: 168 U/L — ABNORMAL HIGH (ref 39–117)
BUN: 8 mg/dL (ref 6–23)
Chloride: 104 mEq/L (ref 96–112)
Potassium: 3.4 mEq/L — ABNORMAL LOW (ref 3.5–5.1)
Sodium: 136 mEq/L (ref 135–145)
Total Bilirubin: 0.4 mg/dL (ref 0.3–1.2)
Total Protein: 5.5 g/dL — ABNORMAL LOW (ref 6.0–8.3)

## 2013-01-17 LAB — LIPASE, BLOOD: Lipase: 43 U/L (ref 11–59)

## 2013-01-17 MED ORDER — OXYCODONE-ACETAMINOPHEN 5-325 MG PO TABS
1.0000 | ORAL_TABLET | ORAL | Status: DC | PRN
  Administered 2013-01-17 – 2013-01-18 (×6): 2 via ORAL
  Filled 2013-01-17 (×6): qty 2

## 2013-01-17 MED ORDER — SENNOSIDES-DOCUSATE SODIUM 8.6-50 MG PO TABS
2.0000 | ORAL_TABLET | Freq: Every day | ORAL | Status: DC
  Filled 2013-01-17 (×2): qty 2

## 2013-01-17 MED ORDER — HYDROMORPHONE HCL PF 2 MG/ML IJ SOLN
2.0000 mg | Freq: Once | INTRAMUSCULAR | Status: AC
  Administered 2013-01-17: 2 mg via INTRAVENOUS
  Filled 2013-01-17: qty 1

## 2013-01-17 MED ORDER — DOCUSATE SODIUM 100 MG PO CAPS: 100.0000 mg | ORAL_CAPSULE | Freq: Two times a day (BID) | ORAL | Status: DC

## 2013-01-17 MED ORDER — OXYCODONE HCL ER 40 MG PO T12A
40.0000 mg | EXTENDED_RELEASE_TABLET | Freq: Two times a day (BID) | ORAL | Status: DC
  Administered 2013-01-17 – 2013-01-18 (×3): 40 mg via ORAL
  Filled 2013-01-17 (×3): qty 1

## 2013-01-17 MED ORDER — ENSURE COMPLETE PO LIQD
237.0000 mL | Freq: Two times a day (BID) | ORAL | Status: DC
  Administered 2013-01-18: 10:00:00 237 mL via ORAL

## 2013-01-17 MED ORDER — SODIUM CHLORIDE 0.9 % IV BOLUS (SEPSIS)
500.0000 mL | Freq: Once | INTRAVENOUS | Status: AC
  Administered 2013-01-17: 04:00:00 via INTRAVENOUS

## 2013-01-17 MED ORDER — SODIUM CHLORIDE 0.9 % IV SOLN
INTRAVENOUS | Status: DC
  Administered 2013-01-17 – 2013-01-18 (×4): via INTRAVENOUS

## 2013-01-17 MED ORDER — ONDANSETRON HCL 4 MG/2ML IJ SOLN: 4.0000 mg | Freq: Four times a day (QID) | INTRAMUSCULAR | Status: DC | PRN

## 2013-01-17 MED ORDER — ONDANSETRON HCL 4 MG PO TABS: 4.0000 mg | ORAL_TABLET | Freq: Four times a day (QID) | ORAL | Status: DC | PRN

## 2013-01-17 MED ORDER — METOCLOPRAMIDE HCL 5 MG PO TABS
5.0000 mg | ORAL_TABLET | Freq: Three times a day (TID) | ORAL | Status: DC
  Administered 2013-01-17 – 2013-01-18 (×4): 5 mg via ORAL
  Filled 2013-01-17 (×7): qty 1

## 2013-01-17 MED ORDER — DOCUSATE SODIUM 100 MG PO CAPS
100.0000 mg | ORAL_CAPSULE | Freq: Two times a day (BID) | ORAL | Status: DC
  Administered 2013-01-17 – 2013-01-18 (×3): 100 mg via ORAL
  Filled 2013-01-17 (×4): qty 1

## 2013-01-17 MED ORDER — METOPROLOL TARTRATE 25 MG PO TABS
25.0000 mg | ORAL_TABLET | Freq: Every morning | ORAL | Status: DC
  Administered 2013-01-17 – 2013-01-18 (×2): 25 mg via ORAL
  Filled 2013-01-17 (×2): qty 1

## 2013-01-17 MED ORDER — HYDROMORPHONE HCL PF 1 MG/ML IJ SOLN
1.0000 mg | INTRAMUSCULAR | Status: DC | PRN
  Administered 2013-01-17: 2 mg via INTRAVENOUS
  Filled 2013-01-17: qty 2

## 2013-01-17 MED ORDER — POTASSIUM CHLORIDE CRYS ER 20 MEQ PO TBCR
40.0000 meq | EXTENDED_RELEASE_TABLET | Freq: Once | ORAL | Status: AC
  Administered 2013-01-17: 40 meq via ORAL
  Filled 2013-01-17: qty 2

## 2013-01-17 MED ORDER — SODIUM CHLORIDE 0.9 % IJ SOLN
3.0000 mL | Freq: Two times a day (BID) | INTRAMUSCULAR | Status: DC
  Administered 2013-01-18: 3 mL via INTRAVENOUS

## 2013-01-17 MED ORDER — ONDANSETRON HCL 4 MG/2ML IJ SOLN
4.0000 mg | Freq: Once | INTRAMUSCULAR | Status: AC
  Administered 2013-01-17: 4 mg via INTRAVENOUS
  Filled 2013-01-17: qty 2

## 2013-01-17 MED ORDER — MORPHINE SULFATE 4 MG/ML IJ SOLN
4.0000 mg | INTRAMUSCULAR | Status: DC | PRN
  Administered 2013-01-17 – 2013-01-18 (×10): 4 mg via INTRAVENOUS
  Filled 2013-01-17 (×10): qty 1

## 2013-01-17 NOTE — Progress Notes (Signed)
INITIAL NUTRITION ASSESSMENT  DOCUMENTATION CODES Per approved criteria  -Underweight   INTERVENTION: Provide Ensure Complete BID Encourage PO intake  NUTRITION DIAGNOSIS: Increased nutrient needs related to underweight status as evidenced by BMI of 17.7.   Goal: Pt to meet >/= 90% of their estimated nutrition needs   Monitor:  PO intake Weight Labs  Reason for Assessment: Malnutrition Screening Tool, score of 2  62 y.o. male  Admitting Dx: Syncope and collapse  ASSESSMENT: 62 y.o. unfortunate male with hx of end stage pancreatic cancer s/p whipple sx and chemotherapy given at [facility], hx of HTN, low back pain, remote hx of drug abuse, ran out of his pain medication, was going to the bathroom and had a syncopal episode.  Pt reports that he has been eating well and that his appetite is good. He reports he has lost weight due to cancer. Weight history shows 11% wt loss in the past 5 months. He is interested in receiving Ensure Complete during this admission.   Height: Ht Readings from Last 1 Encounters:  01/17/13 6\' 1"  (1.854 m)    Weight: Wt Readings from Last 1 Encounters:  01/17/13 134 lb 0.6 oz (60.8 kg)    Ideal Body Weight: 184 lbs  % Ideal Body Weight: 73%  Wt Readings from Last 10 Encounters:  01/17/13 134 lb 0.6 oz (60.8 kg)  08/23/12 150 lb 3.2 oz (68.13 kg)  07/11/12 148 lb 6.4 oz (67.314 kg)  06/26/12 149 lb 9.6 oz (67.858 kg)  05/24/12 150 lb 14.4 oz (68.448 kg)  05/01/12 148 lb 13 oz (67.5 kg)  02/23/12 133 lb 2.5 oz (60.4 kg)  12/06/11 150 lb 9.6 oz (68.312 kg)  06/22/11 145 lb 9.6 oz (66.044 kg)  06/15/11 142 lb 4.8 oz (64.547 kg)    Usual Body Weight: unknown  % Usual Body Weight: NA  BMI:  Body mass index is 17.69 kg/(m^2).  Estimated Nutritional Needs: Kcal: 1700-1900 Protein: 75-85 grams Fluid: 2.1 L/day  Skin: WDL  Diet Order: General  EDUCATION NEEDS: -No education needs identified at this time   Intake/Output Summary (Last  24 hours) at 01/17/13 1621 Last data filed at 01/17/13 1500  Gross per 24 hour  Intake 1342.5 ml  Output    875 ml  Net  467.5 ml    Last BM: PTA  Labs:   Recent Labs Lab 01/17/13 0333  NA 136  K 3.4*  CL 104  CO2 25  BUN 8  CREATININE 0.88  CALCIUM 7.5*  GLUCOSE 90    CBG (last 3)  No results found for this basename: GLUCAP,  in the last 72 hours  Scheduled Meds: . docusate sodium  100 mg Oral BID  . metoCLOPramide  5 mg Oral TID AC  . metoprolol tartrate  25 mg Oral q morning - 10a  . OxyCODONE  40 mg Oral Q12H  . senna-docusate  2 tablet Oral QHS  . sodium chloride  3 mL Intravenous Q12H    Continuous Infusions: . sodium chloride 125 mL/hr at 01/17/13 7829    Past Medical History  Diagnosis Date  . Diarrhea   . Nausea   . Rash     on abdomen from radiation   . Chronic back pain     r/t disc disease--w/bilateral leg pain  . History of drug abuse     Remote--heroin & cocaine  . 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 Hypertension   . Osteoarthritis   . Pancreatitis   .  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 Hx of radiation therapy 02/08/11 to 02/21/11    palliative- RUQ abd, R lateral hepatic mets  . Pancreatic cancer 03/2010    poorly differentiated adenocarcinoma  . Hx of radiation therapy 05/13/11 to 06/01/11    pelvis    Past Surgical History  Procedure Laterality Date  . Whipple procedure  02/08/2010  . Portacath placement  01/06/11    8/15/12tip in superior cavoatrial junction  . Hernia repair      inguinal hernia repair as a child  . Dx laparoscopy  01/13/2011  . Right total hip replacement  08/14/2010  . Hydrocele excision / repair      Ian Malkin RD, LDN Inpatient Clinical Dietitian Pager: [phone] After Hours Pager: [phone]

## 2013-01-17 NOTE — ED Provider Notes (Signed)
Medical screening examination/treatment/procedure(s) were conducted as a shared visit with non-physician practitioner(s) and myself.  I personally evaluated the patient during the encounter   Patient with recurrent pancreatic cancer pain and syncope. Given his drop in Hgb he will need admission for monitoring and possible blood transfusion. No obvious source of bleeding and patient refuses hemoccult.  Audree Camel, MD 01/17/13 [phone]

## 2013-01-17 NOTE — Telephone Encounter (Signed)
s.w. pt and he was admitted to the hospital last night. i cancelled appt and emailed Dr. Truett Perna to advised.

## 2013-01-17 NOTE — ED Notes (Signed)
Bed: ZO10 Expected date: 01/17/13 Expected time: 2:12 AM Means of arrival: Ambulance Comments: Pancreatic ca/syncopal episode

## 2013-01-17 NOTE — Care Management Note (Addendum)
    Page 1 of 2   01/18/2013     2:44:28 PM   CARE MANAGEMENT NOTE 01/18/2013  Patient:  [name]   Account Number:  [id number]  Date Initiated:  01/17/2013  Documentation initiated by:  Lanier Clam  Subjective/Objective Assessment:   62 Y/O M ADMITTED W/SYNCOPE.HX:ES PANCREATIC CA, SUBSTANCE ABUSE.     Action/Plan:   FROM HOME.HAS PCP,PHARMACY.   Anticipated DC Date:  01/18/2013   Anticipated DC Plan:  HOME/SELF CARE      DC Planning Services  Patient refused services      Choice offered to / List presented to:             Status of service:  Completed, signed off Medicare Important Message given?   (If response is "NO", the following Medicare IM given date fields will be blank) Date Medicare IM given:   Date Additional Medicare IM given:    Discharge Disposition:  HOME/SELF CARE  Per UR Regulation:  Reviewed for med. necessity/level of care/duration of stay  If discussed at Long Length of Stay Meetings, dates discussed:    Comments:  01/18/13 Lanier Clam RN,BSN NCM [phone] RECEIVED HH RN/AIDE ORDER.PATIENT PLEASANTLY DECLINES HHC,ONLY WANTS NURSE'S AIDE LIST WHICH WAS GIVEN YESTERDAY.HE WILL CHOOSE ON HIS OWN WHICH SERVICE HE WANTS & WILL INFORM HIS PCP.PATIENT VOICED HIS UNDERSTANDING.MD UPDATED.NO FURTHER D/C NEEDS.  01/17/13 Violeta Lecount RN,BSN NCM [phone] RECEIVED CALL FROM [facility]KKI [facility] ABOUT PATIENT NEEDING A HOSPICE CONS PER DR. SHERRILL,& THAT DR.SHERRILL DID NOT RECEIVE A CALL ABOUT THE ADMISSION.SPOKE TO PATIENT & HE SAID THAT DR. SHERILL'S OFFICE DID KNOW THAT HE WAS IN THE HOSPITAL BECAUSE HE CALLED THEM SINCE HE HAD AN APPT SCHEDULED.PATIENT  ONLY WANTS A NURSE'S AIDE TO COME TOHIS HOUSE TO HELP W/COOKING,& LIGHT HOUSEKEEPING WHICH CAN BE PROVIDED BY MEDICAID.I GAVE HIM THE PRIVATE DUTY CARE AGENCY LIST. HE WAS VERY APPRECIATIVE.WOULD RECOMMEND NURSE'S AIDE FOR HOME.MD UPDATED.

## 2013-01-17 NOTE — H&P (Signed)
[facility] History and Physical  Deland Slocumb ZOX:[id number] DOB: 10/20/1950    PCP:   Dr Alcide Evener on Heme/Onc.  Chief Complaint:  Abdominal pain and syncope.  HPI: [name] is an 62 y.o. unfortunate male with hx of end stage pancreatic cancer s/p whipple sx and chemotherapy given at [facility], hx of HTN, low back pain, remote hx of drug abuse, ran out of his pain medication, was going to the bathroom and had a syncopal episode.   He denied chest pain, palpitation, nausea, vomiting, HA, fever or chills.  He had anemia felt to be from bone marrow suppression.  He denied black or bloody stool and hadn't taken any OTC NSAIDS.  Work up in the ER included a Hb of 7 g/DL, and WBC of 8.1X, with Platelet count of 194K.  His BUN is 8.  Hospitalist was asked to admit him for syncope, anemia, and for pain control of his abdominal pain.  Rewiew of Systems:  Constitutional: Negative for malaise, fever and chills. No significant weight loss or weight gain Eyes: Negative for eye pain, redness and discharge, diplopia, visual changes, or flashes of light. ENMT: Negative for ear pain, hoarseness, nasal congestion, sinus pressure and sore throat. No headaches; tinnitus, drooling, or problem swallowing. Cardiovascular: Negative for chest pain, palpitations, diaphoresis, dyspnea and peripheral edema. ; No orthopnea, PND Respiratory: Negative for cough, hemoptysis, wheezing and stridor. No pleuritic chestpain. Gastrointestinal: Negative for nausea, vomiting, diarrhea, constipation,  melena, blood in stool, hematemesis, jaundice and rectal bleeding.    Genitourinary: Negative for frequency, dysuria, incontinence,flank pain and hematuria; Musculoskeletal: Negative for back pain and neck pain. Negative for swelling and trauma.;  Skin: . Negative for pruritus, rash, abrasions, bruising and skin lesion.; ulcerations Neuro: Negative for headache, lightheadedness and neck stiffness. Negative for weakness,  altered level of consciousness , altered mental status, extremity weakness, burning feet, involuntary movement, seizure and syncope.  Psych: negative for anxiety, depression, insomnia, tearfulness, panic attacks, hallucinations, paranoia, suicidal or homicidal ideation   Past Medical History  Diagnosis Date  . Diarrhea   . Nausea   . Rash     on abdomen from radiation   . Chronic back pain     r/t disc disease--w/bilateral leg pain  . History of drug abuse     Remote--heroin & cocaine  . 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 Hypertension   . Osteoarthritis   . Pancreatitis   . 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 Hx of radiation therapy 02/08/11 to 02/21/11    palliative- RUQ abd, R lateral hepatic mets  . Pancreatic cancer 03/2010    poorly differentiated adenocarcinoma  . Hx of radiation therapy 05/13/11 to 06/01/11    pelvis    Past Surgical History  Procedure Laterality Date  . Whipple procedure  02/08/2010  . Portacath placement  01/06/11    8/15/12tip in superior cavoatrial junction  . Hernia repair      inguinal hernia repair as a child  . Dx laparoscopy  01/13/2011  . Right total hip replacement  08/14/2010  . Hydrocele excision / repair      Medications:  HOME MEDS: Prior to Admission medications   Medication Sig Start Date End Date Taking? Authorizing Provider  docusate sodium 100 MG CAPS Take 100 mg by mouth 2 (two) times daily. 05/04/12  Yes Sorin Luanne Bras, MD  metoCLOPramide (REGLAN) 5 MG tablet Take 5 mg by mouth 3 (three) times daily before meals.  Yes Historical Provider, MD  metoprolol tartrate (LOPRESSOR) 25 MG tablet TAKE 1 TABLET BY MOUTH EVERY DAY 12/07/12  Yes Ladene Artist, MD  OXYCONTIN 40 MG T12A Take 1 tablet (40 mg total) by mouth every 12 (twelve) hours. 08/23/12  Yes Ladene Artist, MD  polyethylene glycol [facility] / GLYCOLAX) packet Take 17 g by mouth daily as needed. For constipation   Yes Historical Provider, MD   senna-docusate (SENOKOT-S) 8.6-50 MG per tablet Take 2 tablets by mouth at bedtime. 05/04/12  Yes Sorin Luanne Bras, MD  prochlorperazine (COMPAZINE) 10 MG tablet Take 10 mg by mouth every 6 (six) hours as needed. For nausea    Historical Provider, MD     Allergies:  Allergies  Allergen Reactions  . No Known Allergies     Social History:   reports that he has been smoking Cigarettes.  He has a 15 pack-year smoking history. He has never used smokeless tobacco. He reports that he does not drink alcohol or use illicit drugs.  Family History: Family History  Problem Relation Age of Onset  . Cancer Mother   . Cancer Sister   . Cancer Sister      Physical Exam: Filed Vitals:   01/17/13 0229  BP: 151/86  Pulse: 62  Temp: 98.1 F (36.7 C)  TempSrc: Oral  Resp: 18  Height: 6\' 1"  (1.854 m)  Weight: 63.504 kg (140 lb)  SpO2: 100%   Blood pressure 151/86, pulse 62, temperature 98.1 F (36.7 C), temperature source Oral, resp. rate 18, height 6\' 1"  (1.854 m), weight 63.504 kg (140 lb), SpO2 100.00%.  GEN:  Pleasant  patient lying in the stretcher in no acute distress; cooperative with exam. PSYCH:  alert and oriented x4; does not appear anxious or depressed; affect is appropriate. HEENT: Mucous membranes pink and anicteric; PERRLA; EOM intact; no cervical lymphadenopathy nor thyromegaly or carotid bruit; no JVD; There were no stridor. Neck is very supple. Breasts:: Not examined CHEST WALL: No tenderness.  Portacath on right chest wall. CHEST: Normal respiration, clear to auscultation bilaterally.  HEART: Regular rate and rhythm.  There are no murmur, rub, or gallops.   BACK: No kyphosis or scoliosis; no CVA tenderness ABDOMEN: soft and tender in the epigastrium. no masses, no organomegaly, normal abdominal bowel sounds; no pannus; no intertriginous candida. There is no rebound and no distention. Rectal Exam: Not done EXTREMITIES: No bone or joint deformity; age-appropriate arthropathy  of the hands and knees; no edema; no ulcerations.  There is no calf tenderness. Genitalia: not examined PULSES: 2+ and symmetric SKIN: Normal hydration no rash or ulceration CNS: Cranial nerves 2-12 grossly intact no focal lateralizing neurologic deficit.  Speech is fluent; uvula elevated with phonation, facial symmetry and tongue midline. DTR are normal bilaterally, cerebella exam is intact, barbinski is negative and strengths are equaled bilaterally.  No sensory loss.   Labs on Admission:  Basic Metabolic Panel:  Recent Labs Lab 01/17/13 0333  NA 136  K 3.4*  CL 104  CO2 25  GLUCOSE 90  BUN 8  CREATININE 0.88  CALCIUM 7.5*   Liver Function Tests:  Recent Labs Lab 01/17/13 0333  AST 33  ALT 13  ALKPHOS 168*  BILITOT 0.4  PROT 5.5*  ALBUMIN 1.7*    Recent Labs Lab 01/17/13 0333  LIPASE 43   No results found for this basename: AMMONIA,  in the last 168 hours CBC:  Recent Labs Lab 01/17/13 0333  WBC 2.2*  NEUTROABS 1.0*  HGB 7.1*  HCT 22.3*  MCV 94.5  PLT 194   Cardiac Enzymes: No results found for this basename: CKTOTAL, CKMB, CKMBINDEX, TROPONINI,  in the last 168 hours  CBG: No results found for this basename: GLUCAP,  in the last 168 hours   Radiological Exams on Admission: No results found.  Assessment/Plan Present on Admission:  . Pancreatic cancer, s/p whipple 02/09/2010, T2N0 . Neutropenia . Syncope and collapse . Abdominal pain anemia  PLAN: His syncope could be vagal or micturiction syncope, or anemia and volume depletion.  Will place him on telemetry and cycle troponins as part of syncope work up.   The anemia is likely from BM suppression rather than a GI bleed.  Will transfuse him 2 PRBC and provide IVF.  Will give IV pain medication as well.  Will continue his home medication including his narcotics.  I asked him about his code status, and he would like to be a full code.  He will be admitted to [facility] service.  He is stable.  Other  plans as per orders.  Code Status: FULL Unk Lightning, MD. [facility] Pager [phone] 7pm to 7am.  01/17/2013, 6:09 AM

## 2013-01-17 NOTE — ED Notes (Signed)
ems reports that patient states that he had a syncopal episode tonight and he's out of his pain meds and complains of severe abdominal pain

## 2013-01-17 NOTE — ED Provider Notes (Signed)
CSN: [id number]     Arrival date & time 01/17/13  0227 History   First MD Initiated Contact with Patient 01/17/13 [phone]     Chief Complaint  Patient presents with  . Abdominal Pain   (Consider location/radiation/quality/duration/timing/severity/associated sxs/prior Treatment) HPI Comments: Patient with end-stage pancreatic cancer presents with exacerbation of pain, episode of syncope. Patient has generalized abdominal pain for which he takes OxyContin 40 mg and oxycodone. Patient states his pain has been worse over the past few days. He has had nausea but no vomiting. He has had diarrhea. Patient states that tonight he felt the need to have a bowel movement and stood up and passed out. He awoke on the floor. He denies injury from the fall. He was transported to the hospital by EMS. States his blood counts are low. States that chemotherapy was stopped 1-2 months ago due to myelosuppression and treatment ineffective. [facility] notes show transition to palliative care only. Onset of symptoms gradual. Course is constant. Nothing makes symptoms better or worse. The onset of this condition was acute. The course is constant. Aggravating factors: palpation. Alleviating factors: none.    The history is provided by the patient and medical records.    Past Medical History  Diagnosis Date  . Diarrhea   . Nausea   . Rash     on abdomen from radiation   . Chronic back pain     r/t disc disease--w/bilateral leg pain  . History of drug abuse     Remote--heroin & cocaine  . 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 Hypertension   . Osteoarthritis   . Pancreatitis   . 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 Hx of radiation therapy 02/08/11 to 02/21/11    palliative- RUQ abd, R lateral hepatic mets  . Pancreatic cancer 03/2010    poorly differentiated adenocarcinoma  . Hx of radiation therapy 05/13/11 to 06/01/11    pelvis   Past Surgical History  Procedure Laterality Date  . Whipple  procedure  02/08/2010  . Portacath placement  01/06/11    8/15/12tip in superior cavoatrial junction  . Hernia repair      inguinal hernia repair as a child  . Dx laparoscopy  01/13/2011  . Right total hip replacement  08/14/2010  . Hydrocele excision / repair     Family History  Problem Relation Age of Onset  . Cancer Mother   . Cancer Sister   . Cancer Sister    History  Substance Use Topics  . Smoking status: Current Every Day Smoker -- 0.50 packs/day for 30 years    Types: Cigarettes  . Smokeless tobacco: Never Used  . Alcohol Use: No     Comment: occasional basis    Review of Systems  Constitutional: Negative for fever.  HENT: Negative for sore throat and rhinorrhea.   Eyes: Negative for redness.  Respiratory: Negative for cough.   Cardiovascular: Negative for chest pain.  Gastrointestinal: Positive for nausea, abdominal pain and diarrhea. Negative for vomiting and blood in stool.  Genitourinary: Negative for dysuria.  Musculoskeletal: Negative for myalgias.  Skin: Negative for rash.  Neurological: Positive for syncope and light-headedness. Negative for headaches.    Allergies  No known allergies  Home Medications   Current Outpatient Rx  Name  Route  Sig  Dispense  Refill  . docusate sodium 100 MG CAPS   Oral   Take 100 mg by mouth 2 (two) times daily.   10 capsule      .  metoCLOPramide (REGLAN) 5 MG tablet   Oral   Take 5 mg by mouth 3 (three) times daily before meals.         . metoprolol tartrate (LOPRESSOR) 25 MG tablet      TAKE 1 TABLET BY MOUTH EVERY DAY   30 tablet   0     WILL CHECK WITH DR.SHERRILL CONCERNING PT.'S NEXT  ...   . oxyCODONE-acetaminophen (PERCOCET) 10-325 MG per tablet   Oral   Take 1 tablet by mouth every 4 (four) hours as needed for pain. Pain   42 tablet   0   . OXYCONTIN 40 MG T12A   Oral   Take 1 tablet (40 mg total) by mouth every 12 (twelve) hours.   60 tablet   0     Dispense as written.   . polyethylene  glycol (MIRALAX / GLYCOLAX) packet   Oral   Take 17 g by mouth daily as needed. For constipation         . prochlorperazine (COMPAZINE) 10 MG tablet   Oral   Take 10 mg by mouth every 6 (six) hours as needed. For nausea         . senna-docusate (SENOKOT-S) 8.6-50 MG per tablet   Oral   Take 2 tablets by mouth at bedtime.          BP 151/86  Pulse 62  Temp(Src) 98.1 F (36.7 C) (Oral)  Resp 18  Ht 6\' 1"  (1.854 m)  Wt 140 lb (63.504 kg)  BMI 18.47 kg/m2  SpO2 100% Physical Exam  Nursing note and vitals reviewed. Constitutional: He appears well-developed and well-nourished.  HENT:  Head: Normocephalic and atraumatic.  Eyes: Right eye exhibits no discharge. Left eye exhibits no discharge.  Conjunctiva pale  Neck: Normal range of motion. Neck supple.  Cardiovascular: Normal rate, regular rhythm and normal heart sounds.   Pulmonary/Chest: Effort normal and breath sounds normal.  Abdominal: Soft. There is generalized tenderness (worse in LUQ). There is no rigidity, no rebound and no guarding.  Neurological: He is alert.  Skin: Skin is warm and dry.  Psychiatric: He has a normal mood and affect.    ED Course  Procedures (including critical care time) Labs Review Labs Reviewed  CBC WITH DIFFERENTIAL - Abnormal; Notable for the following:    WBC 2.2 (*)    RBC 2.36 (*)    Hemoglobin 7.1 (*)    HCT 22.3 (*)    RDW 17.1 (*)    Neutrophils Relative % 42 (*)    Monocytes Relative 23 (*)    Neutro Abs 1.0 (*)    All other components within normal limits  COMPREHENSIVE METABOLIC PANEL - Abnormal; Notable for the following:    Potassium 3.4 (*)    Calcium 7.5 (*)    Total Protein 5.5 (*)    Albumin 1.7 (*)    Alkaline Phosphatase 168 (*)    All other components within normal limits  LIPASE, BLOOD  URINALYSIS, ROUTINE W REFLEX MICROSCOPIC  PATHOLOGIST SMEAR REVIEW  TYPE AND SCREEN  PREPARE RBC (CROSSMATCH)   Imaging Review No results found.  2:59 AM Patient  seen and examined. Work-up initiated. Medications ordered. Patient was discussed with Audree Camel, MD   Vital signs reviewed and are as follows: Filed Vitals:   01/17/13 0229  BP: 151/86  Pulse: 62  Temp: 98.1 F (36.7 C)  Resp: 18   4:24 AM Hgb low, will transfuse, admit.   5:31 AM Spoke  with Dr. Dierdre Searles who will admit.   Pt refuses hemoccult.    MDM   1. Anemia   2. Syncope    Admit for syncope, suspect 2/2 anemia. Blood transfusion ordered.     Renne Crigler, PA-C 01/17/13 [phone]

## 2013-01-17 NOTE — Progress Notes (Signed)
Pt seen and examined at bedside. Please see earlier admission note by Dr. Conley Rolls. Patient was admitted for evaluation of syncope, pain control. On admission blood work significant for potassium of 3.4, WBC 2.2, hemoglobin 7.1. 2 units of PRBC ordered. We'll also supplement potassium with K. Dur 40 mEq by mouth x1 dose. Repeat CBC and BMP in the morning.  Debbora Presto, MD  [facility] Pager [phone]  If 7PM-7AM, please contact night-coverage [email or web address] Password [email or web address]

## 2013-01-18 ENCOUNTER — Encounter: Payer: Self-pay | Admitting: *Deleted

## 2013-01-18 ENCOUNTER — Telehealth: Payer: Self-pay | Admitting: Dietician

## 2013-01-18 DIAGNOSIS — K921 Melena: Secondary | ICD-10-CM

## 2013-01-18 LAB — TYPE AND SCREEN
Antibody Screen: NEGATIVE
Unit division: 0

## 2013-01-18 LAB — CBC
MCH: 30 pg (ref 26.0–34.0)
Platelets: 170 10*3/uL (ref 150–400)
RBC: 3.27 MIL/uL — ABNORMAL LOW (ref 4.22–5.81)
WBC: 3.2 10*3/uL — ABNORMAL LOW (ref 4.0–10.5)

## 2013-01-18 LAB — BASIC METABOLIC PANEL
CO2: 25 mEq/L (ref 19–32)
Calcium: 7.4 mg/dL — ABNORMAL LOW (ref 8.4–10.5)
Sodium: 134 mEq/L — ABNORMAL LOW (ref 135–145)

## 2013-01-18 MED ORDER — HEPARIN SOD (PORK) LOCK FLUSH 100 UNIT/ML IV SOLN
500.0000 [IU] | INTRAVENOUS | Status: AC | PRN
  Administered 2013-01-18: 500 [IU]

## 2013-01-18 MED ORDER — OXYCODONE-ACETAMINOPHEN 10-650 MG PO TABS: 1.0000 | ORAL_TABLET | Freq: Four times a day (QID) | ORAL | Status: DC | PRN

## 2013-01-18 MED ORDER — SODIUM CHLORIDE 0.9 % IJ SOLN
10.0000 mL | INTRAMUSCULAR | Status: DC | PRN
  Administered 2013-01-18: 10:00:00 10 mL

## 2013-01-18 NOTE — Discharge Summary (Addendum)
Physician Discharge Summary  [name]:[id number] DOB: Jun 14, 1950 DOA: 01/17/2013  PCP: Default, Provider, MD  Admit date: 01/17/2013 Discharge date: 01/18/2013  Recommendations for Outpatient Follow-up:  1. Pt will need to follow up with PCP in 2-3 weeks post discharge 2. Please obtain BMP to evaluate electrolytes and kidney function 3. Please also check CBC to evaluate Hg and Hct levels  Discharge Diagnoses:  Principal Problem:   Syncope and collapse Active Problems:   Pancreatic cancer, s/p whipple 02/09/2010, T2N0   Abdominal pain   Neutropenia   Anemia    Discharge Condition: Stable  Diet recommendation: Heart healthy diet discussed in details   History of present illness:  62 y.o. unfortunate male with hx of end stage pancreatic cancer s/p whipple sx and chemotherapy given at [facility], hx of HTN, low back pain, remote hx of drug abuse, ran out of his pain medication, was going to the bathroom and had a syncopal episode. He denied chest pain, palpitation, nausea, vomiting, HA, fever or chills. He had anemia felt to be from bone marrow suppression. He denied black or bloody stool and hadn't taken any OTC NSAIDS. Work up in the ER included a Hb of 7 g/DL, and WBC of 8.1X, with Platelet count of 194K. His BUN is 8. Hospitalist was asked to admit him for syncope, anemia, and for pain control of his abdominal pain.   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  - Hg and Hct improved over 24 hours   Procedures/Studies:  None  Consultations:  None  Antibiotics:  None  Discharge Exam: Filed Vitals:   01/18/13 0457  BP: 150/80  Pulse: 77  Temp: 98.1 F (36.7 C)  Resp: 18   Filed Vitals:   01/17/13 1330 01/17/13 1400 01/17/13 2136 01/18/13 0457  BP: 136/77 136/75 151/88 150/80  Pulse: 69 69 80 77  Temp: 98.3 F (36.8 C) 98 F (36.7 C) 98.5 F (36.9 C) 98.1 F (36.7 C)  TempSrc: Oral Oral Oral Oral  Resp: 18 18 18 18   Height:      Weight:      SpO2: 100% 100% 100% 100%    General: Pt is alert, follows commands appropriately, not in acute distress, cachectic  Cardiovascular: Regular rate and rhythm, S1/S2 +, no murmurs, no rubs, no gallops Respiratory: Clear to auscultation bilaterally, no wheezing, no crackles, no rhonchi Abdominal: Soft, non tender, non distended, bowel sounds +, no guarding Extremities: no edema, no cyanosis, pulses palpable bilaterally DP and PT Neuro: Grossly nonfocal  Discharge Instructions  Discharge Orders   Future Orders Complete By Expires   Diet - low sodium heart healthy  As directed    Increase activity slowly  As directed        Medication List         DSS 100 MG Caps  Take 100 mg by mouth 2 (two) times  daily.     metoCLOPramide 5 MG tablet  Commonly known as:  REGLAN  Take 5 mg by mouth 3 (three) times daily before meals.     metoprolol tartrate 25 MG tablet  Commonly known as:  LOPRESSOR  TAKE 1 TABLET BY MOUTH EVERY DAY     oxyCODONE-acetaminophen 10-650 MG per tablet  Commonly known as:  PERCOCET  Take 1 tablet by mouth every 6 (six) hours as needed for pain.     OXYCONTIN 40 mg T12a 12 hr tablet  Generic drug:  OxyCODONE  Take 1 tablet (40 mg total) by mouth every 12 (twelve) hours.     polyethylene glycol packet  Commonly known as:  MIRALAX / GLYCOLAX  Take 17 g by mouth daily as needed. For  constipation     prochlorperazine 10 MG tablet  Commonly known as:  COMPAZINE  Take 10 mg by mouth every 6 (six) hours as needed. For nausea     senna-docusate 8.6-50 MG per tablet  Commonly known as:  Senokot-S  Take 2 tablets by mouth at bedtime.           Follow-up Information   Follow up with Debbora Presto, MD. (call my cell phone with any questions [phone])    Specialty:  Internal Medicine   Contact information:   [address] [place] [address] [phone]        The results of significant diagnostics from this hospitalization (including imaging, microbiology, ancillary and laboratory) are listed below for reference.     Microbiology: No results found for this or any previous visit (from the past 240 hour(s)).   Labs: Basic Metabolic Panel:  Recent Labs Lab 01/17/13 0333 01/18/13 0505  NA 136 134*  K 3.4* 3.9  CL 104 104  CO2 25 25  GLUCOSE 90 84  BUN 8 7  CREATININE 0.88 0.75  CALCIUM 7.5* 7.4*   Liver Function Tests:  Recent Labs Lab 01/17/13 0333  AST 33  ALT 13  ALKPHOS 168*  BILITOT 0.4  PROT 5.5*  ALBUMIN 1.7*    Recent Labs Lab 01/17/13 0333  LIPASE 43   CBC:  Recent Labs Lab 01/17/13 0333 01/18/13 0505  WBC 2.2* 3.2*  NEUTROABS 1.0*  --   HGB 7.1* 9.8*  HCT 22.3* 30.1*  MCV 94.5 92.0  PLT 194 170     SIGNED: Time coordinating discharge: Over 30 minutes  Debbora Presto, MD  [facility] 01/18/2013, 10:00 AM Pager [phone]  If 7PM-7AM, please contact night-coverage [email or web address] Password [email or web address]

## 2013-01-18 NOTE — Progress Notes (Signed)
Discharge to home, ambulatory, no complaints of any discomfort upon discharge. Discharge instructions done and was given to the patient, patient does not want further  Discharge education ,"stated I know everything about that, no need for you to tell me".  Port de accessed by IV Rn.

## 2013-01-18 NOTE — Progress Notes (Signed)
Clinical Social Work met with patient in CSW office.  Patient states he was just discharged from hospital and has no transportation to get home.  CSW provided patient with taxi voucher.  Patient stated he is currently overwhelmed and needs to go home.  He states he can no longer receive chemotherapy treatment and he is just "going day by day".  [name] plans to follow up with CSW on Monday.  Kathrin Penner, MSW, LCSW Clinical Social Worker [facility] [phone]

## 2013-01-18 NOTE — Progress Notes (Signed)
After explaining to pt the importance of having a bed alarm on throughout the night for his safety (pt came in with abdominal pain and syncope with collapse in his home at night-time), pt adamantly refuses to let us place the bed alarm on, stating "I don't need all of that".  Pt denies dizziness/lightheadedness when he stands.  After asking pt to call us when he needs to get up, pt states that he can get up by himself and didn't need our help.  Pt has been up and down to the bathroom by himself throughout the night. Will continue to monitor pt throughout the night.

## 2013-01-31 ENCOUNTER — Telehealth: Payer: Self-pay | Admitting: *Deleted

## 2013-01-31 NOTE — Telephone Encounter (Signed)
Called pt to offer office visit. He declined appt. Encouraged him to call office as needed.

## 2013-02-14 ENCOUNTER — Encounter: Payer: Self-pay | Admitting: *Deleted

## 2013-02-14 NOTE — Progress Notes (Signed)
02/14/13 at 9:32am [place] follow up - The research nurse called and spoke to the pt on the phone.  He said he "was hanging in there".  He states that he is no longer going to [facility] for his healthcare needs.  He said that he has a hospice nurse that comes to his home.  He said that she is working on his pain management.  He said that he is able to walk to the store.   The research nurse gave the pt some emotional support and encouraged the pt to call for any needs.  The research nurse left messages for Dr. Kalman Drape nurse about the pt's status.

## 2013-03-08 ENCOUNTER — Inpatient Hospital Stay (HOSPITAL_COMMUNITY)
Admission: EM | Admit: 2013-03-08 | Discharge: 2013-03-15 | DRG: 808 | Disposition: A | Discharge status: Hospice | Attending: Internal Medicine | Admitting: Internal Medicine

## 2013-03-08 ENCOUNTER — Encounter (HOSPITAL_COMMUNITY): Payer: Self-pay | Admitting: Emergency Medicine

## 2013-03-08 ENCOUNTER — Emergency Department (HOSPITAL_COMMUNITY)

## 2013-03-08 DIAGNOSIS — E43 Unspecified severe protein-calorie malnutrition: Secondary | ICD-10-CM | POA: Diagnosis present

## 2013-03-08 DIAGNOSIS — Z66 Do not resuscitate: Secondary | ICD-10-CM | POA: Diagnosis present

## 2013-03-08 DIAGNOSIS — R51 Headache: Secondary | ICD-10-CM | POA: Diagnosis present

## 2013-03-08 DIAGNOSIS — R0609 Other forms of dyspnea: Secondary | ICD-10-CM | POA: Diagnosis present

## 2013-03-08 DIAGNOSIS — C259 Malignant neoplasm of pancreas, unspecified: Secondary | ICD-10-CM

## 2013-03-08 DIAGNOSIS — R1900 Intra-abdominal and pelvic swelling, mass and lump, unspecified site: Secondary | ICD-10-CM

## 2013-03-08 DIAGNOSIS — F17200 Nicotine dependence, unspecified, uncomplicated: Secondary | ICD-10-CM | POA: Diagnosis present

## 2013-03-08 DIAGNOSIS — C7A1 Malignant poorly differentiated neuroendocrine tumors: Secondary | ICD-10-CM

## 2013-03-08 DIAGNOSIS — Z681 Body mass index (BMI) 19 or less, adult: Secondary | ICD-10-CM

## 2013-03-08 DIAGNOSIS — D619 Aplastic anemia, unspecified: Secondary | ICD-10-CM

## 2013-03-08 DIAGNOSIS — C50919 Malignant neoplasm of unspecified site of unspecified female breast: Secondary | ICD-10-CM | POA: Diagnosis present

## 2013-03-08 DIAGNOSIS — R1011 Right upper quadrant pain: Secondary | ICD-10-CM | POA: Diagnosis present

## 2013-03-08 DIAGNOSIS — C787 Secondary malignant neoplasm of liver and intrahepatic bile duct: Secondary | ICD-10-CM | POA: Diagnosis present

## 2013-03-08 DIAGNOSIS — G893 Neoplasm related pain (acute) (chronic): Secondary | ICD-10-CM | POA: Diagnosis present

## 2013-03-08 DIAGNOSIS — R5381 Other malaise: Secondary | ICD-10-CM | POA: Diagnosis present

## 2013-03-08 DIAGNOSIS — M545 Low back pain, unspecified: Secondary | ICD-10-CM

## 2013-03-08 DIAGNOSIS — D61818 Other pancytopenia: Principal | ICD-10-CM

## 2013-03-08 DIAGNOSIS — C7B8 Other secondary neuroendocrine tumors: Secondary | ICD-10-CM

## 2013-03-08 DIAGNOSIS — R109 Unspecified abdominal pain: Secondary | ICD-10-CM

## 2013-03-08 DIAGNOSIS — Z96649 Presence of unspecified artificial hip joint: Secondary | ICD-10-CM

## 2013-03-08 DIAGNOSIS — Z90411 Acquired partial absence of pancreas: Secondary | ICD-10-CM

## 2013-03-08 DIAGNOSIS — I10 Essential (primary) hypertension: Secondary | ICD-10-CM | POA: Diagnosis present

## 2013-03-08 DIAGNOSIS — Z515 Encounter for palliative care: Secondary | ICD-10-CM

## 2013-03-08 DIAGNOSIS — D65 Disseminated intravascular coagulation [defibrination syndrome]: Secondary | ICD-10-CM | POA: Diagnosis present

## 2013-03-08 DIAGNOSIS — D649 Anemia, unspecified: Secondary | ICD-10-CM

## 2013-03-08 DIAGNOSIS — R0989 Other specified symptoms and signs involving the circulatory and respiratory systems: Secondary | ICD-10-CM | POA: Diagnosis present

## 2013-03-08 DIAGNOSIS — D709 Neutropenia, unspecified: Secondary | ICD-10-CM

## 2013-03-08 DIAGNOSIS — Z9221 Personal history of antineoplastic chemotherapy: Secondary | ICD-10-CM

## 2013-03-08 LAB — CBC WITH DIFFERENTIAL/PLATELET
Eosinophils Relative: 1 % (ref 0–5)
Lymphocytes Relative: 29 % (ref 12–46)
Lymphs Abs: 0.3 10*3/uL — ABNORMAL LOW (ref 0.7–4.0)
MCV: 99.1 fL (ref 78.0–100.0)
Monocytes Relative: 17 % — ABNORMAL HIGH (ref 3–12)
Neutrophils Relative %: 53 % (ref 43–77)
Platelets: 6 10*3/uL — CL (ref 150–400)
RBC: 1.12 MIL/uL — ABNORMAL LOW (ref 4.22–5.81)
WBC: 1 10*3/uL — CL (ref 4.0–10.5)

## 2013-03-08 LAB — CBC
HCT: 18.4 % — ABNORMAL LOW (ref 39.0–52.0)
Hemoglobin: 6.2 g/dL — CL (ref 13.0–17.0)
MCH: 30 pg (ref 26.0–34.0)
MCHC: 33.7 g/dL (ref 30.0–36.0)
MCV: 88.9 fL (ref 78.0–100.0)
Platelets: 40 10*3/uL — ABNORMAL LOW (ref 150–400)
RDW: 20.7 % — ABNORMAL HIGH (ref 11.5–15.5)

## 2013-03-08 LAB — COMPREHENSIVE METABOLIC PANEL
ALT: 31 U/L (ref 0–53)
AST: 52 U/L — ABNORMAL HIGH (ref 0–37)
Albumin: 2.1 g/dL — ABNORMAL LOW (ref 3.5–5.2)
Alkaline Phosphatase: 147 U/L — ABNORMAL HIGH (ref 39–117)
Chloride: 98 mEq/L (ref 96–112)
Potassium: 3.3 mEq/L — ABNORMAL LOW (ref 3.5–5.1)
Sodium: 133 mEq/L — ABNORMAL LOW (ref 135–145)
Total Bilirubin: 0.6 mg/dL (ref 0.3–1.2)

## 2013-03-08 LAB — LIPASE, BLOOD: Lipase: 49 U/L (ref 11–59)

## 2013-03-08 LAB — DIC (DISSEMINATED INTRAVASCULAR COAGULATION)PANEL
D-Dimer, Quant: 1.57 ug/mL-FEU — ABNORMAL HIGH (ref 0.00–0.48)
INR: 1.01 (ref 0.00–1.49)
Platelets: 40 10*3/uL — ABNORMAL LOW (ref 150–400)
aPTT: 24 seconds (ref 24–37)

## 2013-03-08 LAB — DIC (DISSEMINATED INTRAVASCULAR COAGULATION) PANEL: Prothrombin Time: 13.1 seconds (ref 11.6–15.2)

## 2013-03-08 LAB — PREPARE RBC (CROSSMATCH)

## 2013-03-08 MED ORDER — PROCHLORPERAZINE MALEATE 10 MG PO TABS
10.0000 mg | ORAL_TABLET | Freq: Four times a day (QID) | ORAL | Status: DC | PRN
  Filled 2013-03-08: qty 1

## 2013-03-08 MED ORDER — POLYETHYLENE GLYCOL 3350 17 G PO PACK
17.0000 g | PACK | Freq: Every day | ORAL | Status: DC | PRN
  Filled 2013-03-08: qty 1

## 2013-03-08 MED ORDER — ONDANSETRON HCL 4 MG/2ML IJ SOLN
4.0000 mg | Freq: Four times a day (QID) | INTRAMUSCULAR | Status: DC | PRN
  Administered 2013-03-08: 4 mg via INTRAVENOUS
  Filled 2013-03-08: qty 2

## 2013-03-08 MED ORDER — SODIUM CHLORIDE 0.9 % IV SOLN
Freq: Once | INTRAVENOUS | Status: AC
  Administered 2013-03-08: 05:00:00 via INTRAVENOUS

## 2013-03-08 MED ORDER — ONDANSETRON HCL 4 MG PO TABS: 4.0000 mg | ORAL_TABLET | Freq: Four times a day (QID) | ORAL | Status: DC | PRN

## 2013-03-08 MED ORDER — SODIUM CHLORIDE 0.9 % IV SOLN
INTRAVENOUS | Status: DC
  Administered 2013-03-09 – 2013-03-11 (×3): via INTRAVENOUS
  Administered 2013-03-12 (×2): 1000 mL via INTRAVENOUS
  Administered 2013-03-13: 17:00:00 via INTRAVENOUS

## 2013-03-08 MED ORDER — HYDROMORPHONE HCL PF 2 MG/ML IJ SOLN
1.5000 mg | INTRAMUSCULAR | Status: DC | PRN
  Administered 2013-03-08 – 2013-03-09 (×4): 1.5 mg via INTRAVENOUS
  Filled 2013-03-08 (×5): qty 1

## 2013-03-08 MED ORDER — METOCLOPRAMIDE HCL 5 MG PO TABS
5.0000 mg | ORAL_TABLET | Freq: Three times a day (TID) | ORAL | Status: DC
  Administered 2013-03-08 – 2013-03-15 (×21): 5 mg via ORAL
  Filled 2013-03-08 (×24): qty 1

## 2013-03-08 MED ORDER — OXYCODONE-ACETAMINOPHEN 5-325 MG PO TABS
2.0000 | ORAL_TABLET | Freq: Four times a day (QID) | ORAL | Status: DC | PRN
  Administered 2013-03-08 – 2013-03-10 (×6): 2 via ORAL
  Filled 2013-03-08 (×7): qty 2

## 2013-03-08 MED ORDER — SENNOSIDES-DOCUSATE SODIUM 8.6-50 MG PO TABS
2.0000 | ORAL_TABLET | Freq: Every day | ORAL | Status: DC
  Administered 2013-03-08: 2 via ORAL
  Filled 2013-03-08 (×3): qty 2

## 2013-03-08 MED ORDER — ONDANSETRON HCL 4 MG/2ML IJ SOLN
4.0000 mg | Freq: Once | INTRAMUSCULAR | Status: AC
  Administered 2013-03-08: 4 mg via INTRAVENOUS
  Filled 2013-03-08: qty 2

## 2013-03-08 MED ORDER — HYDROMORPHONE HCL PF 1 MG/ML IJ SOLN
1.0000 mg | Freq: Once | INTRAMUSCULAR | Status: AC
  Administered 2013-03-08: 1 mg via INTRAVENOUS
  Filled 2013-03-08: qty 1

## 2013-03-08 MED ORDER — ENSURE COMPLETE PO LIQD
237.0000 mL | Freq: Two times a day (BID) | ORAL | Status: DC
  Administered 2013-03-10 – 2013-03-13 (×4): 237 mL via ORAL

## 2013-03-08 NOTE — Progress Notes (Signed)
INITIAL NUTRITION ASSESSMENT  DOCUMENTATION CODES Per approved criteria  -Underweight   INTERVENTION: Diet advancement per MD discretion Provide Ensure Complete BID once diet is advanced  NUTRITION DIAGNOSIS: Increased nutrient needs related to underweight and cancer as evidenced by pt's chart and BMI of 15.4.   Goal: Pt to meet >/= 90% of their estimated nutrition needs   Monitor:  Diet advancement PO intake Weight Labs  Reason for Assessment: Malnutrition Screening Tool, score of 2  62 y.o. male  Admitting Dx: Pancytopenia  ASSESSMENT: 62 y/o male with hx of end stage pancreatic cancer s/p whipple sx and chemotherapy given at [facility], hx of HTN, low back pain, comes in with cc of abd pain. Pt has epigastric abd pain and right sided abd pain - same area and character as his usual pain - just more intense. Pt has some nausea, no emesis and no diarrhea, passing flatus. Pt states he does have hx of COPD and home O2 use. He also reports getting regular transfusions of PRBC, and missing his appt yesterday at [facility].  Pt asleep at time of visit with blood transfusion in progress. Pt assessed by RD 01/17/13 and pt reported good appetite and eating well; he reported weight loss was due to advanced cancer and requested Ensure supplements. Per weight history below pt has had 23% wt loss in less than 7 months- sign of severe malnutrition.   Height: Ht Readings from Last 1 Encounters:  03/08/13 6\' 1"  (1.854 m)    Weight: Wt Readings from Last 1 Encounters:  03/08/13 116 lb 13.5 oz (53 kg)    Ideal Body Weight: 184 lbs  % Ideal Body Weight: 63%  Wt Readings from Last 10 Encounters:  03/08/13 116 lb 13.5 oz (53 kg)  01/17/13 134 lb 0.6 oz (60.8 kg)  08/23/12 150 lb 3.2 oz (68.13 kg)  07/11/12 148 lb 6.4 oz (67.314 kg)  06/26/12 149 lb 9.6 oz (67.858 kg)  05/24/12 150 lb 14.4 oz (68.448 kg)  05/01/12 148 lb 13 oz (67.5 kg)  02/23/12 133 lb 2.5 oz (60.4 kg)  12/06/11 150 lb 9.6 oz  (68.312 kg)  06/22/11 145 lb 9.6 oz (66.044 kg)    Usual Body Weight: 150 lbs (January 2014)  % Usual Body Weight: 77%  BMI:  Body mass index is 15.42 kg/(m^2).  Estimated Nutritional Needs: Kcal: 1800-2000 Protein: 80-90 grams Fluid: 1.8- 2 L/day  Skin: WDL  Diet Order: NPO  EDUCATION NEEDS: -No education needs identified at this time   Intake/Output Summary (Last 24 hours) at 03/08/13 1043 Last data filed at 03/08/13 0933  Gross per 24 hour  Intake    354 ml  Output    325 ml  Net     29 ml    Last BM: PTA  Labs:   Recent Labs Lab 03/08/13 0420  NA 133*  K 3.3*  CL 98  CO2 27  BUN 22  CREATININE 0.87  CALCIUM 8.2*  GLUCOSE 100*    CBG (last 3)  No results found for this basename: GLUCAP,  in the last 72 hours  Scheduled Meds: . metoCLOPramide  5 mg Oral TID AC  . senna-docusate  2 tablet Oral QHS    Continuous Infusions: . sodium chloride 50 mL/hr at 03/08/13 0730    Past Medical History  Diagnosis Date  . Diarrhea   . Nausea   . Rash     on abdomen from radiation   . Chronic back pain  r/t disc disease--w/bilateral leg pain  . History of drug abuse     Remote--heroin & cocaine  . 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 Hypertension   . Osteoarthritis   . Pancreatitis   . 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 Hx of radiation therapy 02/08/11 to 02/21/11    palliative- RUQ abd, R lateral hepatic mets  . Pancreatic cancer 03/2010    poorly differentiated adenocarcinoma  . Hx of radiation therapy 05/13/11 to 06/01/11    pelvis    Past Surgical History  Procedure Laterality Date  . Whipple procedure  02/08/2010  . Portacath placement  01/06/11    8/15/12tip in superior cavoatrial junction  . Hernia repair      inguinal hernia repair as a child  . Dx laparoscopy  01/13/2011  . Right total hip replacement  08/14/2010  . Hydrocele excision / repair      Ian Malkin RD, LDN Inpatient Clinical  Dietitian Pager: [phone] After Hours Pager: [phone]

## 2013-03-08 NOTE — ED Notes (Signed)
EMS called to home.  Found patient alert and oriented x3.  Nonproductive cough. 4L O2 @ 100%.  Abdominal pain described 8 of 10 and is like is pancreatic cancer pain but  Is worse than normal.  He has been taking his pain medication with no help.  Patient was  Suppose to be at [facility] for a blood transfusion yesterday but missed his appointment.

## 2013-03-08 NOTE — ED Provider Notes (Addendum)
CSN: [id number]     Arrival date & time 03/08/13  4098 History   First MD Initiated Contact with Patient 03/08/13 [phone]     Chief Complaint  Patient presents with  . Shortness of Breath  . Abdominal Pain   (Consider location/radiation/quality/duration/timing/severity/associated sxs/prior Treatment) HPI Comments: 62 y/o male with hx of end stage pancreatic cancer s/p whipple sx and chemotherapy given at [facility], hx of HTN, low back pain, comes in with cc of abd pain. Pt has epigastric abd pain and right sided abd pain - same area and character as his usual pain - just more intense. Pt has some nausea, no emesis and no diarrhea, passing flatus. Pt also has mild dib, states he does have hx of COPD and home O2 use. He also reports getting regular transfusions of PRBC, and missing his appt y'day at [facility].   Patient is a 62 y.o. male presenting with shortness of breath and abdominal pain. The history is provided by the patient and medical records.  Shortness of Breath Associated symptoms: abdominal pain   Associated symptoms: no chest pain, no cough, no fever, no headaches, no neck pain and no vomiting   Abdominal Pain Associated symptoms: constipation, nausea and shortness of breath   Associated symptoms: no chest pain, no chills, no cough, no diarrhea, no dysuria, no fever and no vomiting     Past Medical History  Diagnosis Date  . Diarrhea   . Nausea   . Rash     on abdomen from radiation   . Chronic back pain     r/t disc disease--w/bilateral leg pain  . History of drug abuse     Remote--heroin & cocaine  . 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 Hypertension   . Osteoarthritis   . Pancreatitis   . 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 Hx of radiation therapy 02/08/11 to 02/21/11    palliative- RUQ abd, R lateral hepatic mets  . Pancreatic cancer 03/2010    poorly differentiated adenocarcinoma  . Hx of radiation therapy 05/13/11 to 06/01/11    pelvis   Past  Surgical History  Procedure Laterality Date  . Whipple procedure  02/08/2010  . Portacath placement  01/06/11    8/15/12tip in superior cavoatrial junction  . Hernia repair      inguinal hernia repair as a child  . Dx laparoscopy  01/13/2011  . Right total hip replacement  08/14/2010  . Hydrocele excision / repair     Family History  Problem Relation Age of Onset  . Cancer Mother   . Cancer Sister   . Cancer Sister    History  Substance Use Topics  . Smoking status: Current Every Day Smoker -- 0.50 packs/day for 30 years    Types: Cigarettes  . Smokeless tobacco: Never Used  . Alcohol Use: No     Comment: occasional basis    Review of Systems  Constitutional: Negative for fever, chills and activity change.  Eyes: Negative for visual disturbance.  Respiratory: Positive for shortness of breath. Negative for cough and chest tightness.   Cardiovascular: Negative for chest pain.  Gastrointestinal: Positive for nausea, abdominal pain and constipation. Negative for vomiting, diarrhea, blood in stool and abdominal distention.  Genitourinary: Negative for dysuria, enuresis and difficulty urinating.  Musculoskeletal: Negative for arthralgias and neck pain.  Neurological: Positive for dizziness, weakness and light-headedness. Negative for syncope and headaches.  Hematological: Does not bruise/bleed easily.  Psychiatric/Behavioral: Negative for confusion.  Allergies  No known allergies  Home Medications   Current Outpatient Rx  Name  Route  Sig  Dispense  Refill  . docusate sodium 100 MG CAPS   Oral   Take 100 mg by mouth 2 (two) times daily.   10 capsule      . metoCLOPramide (REGLAN) 5 MG tablet   Oral   Take 5 mg by mouth 3 (three) times daily before meals.         . metoprolol tartrate (LOPRESSOR) 25 MG tablet      TAKE 1 TABLET BY MOUTH EVERY DAY   30 tablet   0     WILL CHECK WITH DR.SHERRILL CONCERNING PT.'S NEXT  ...   . oxyCODONE-acetaminophen (PERCOCET)  10-650 MG per tablet   Oral   Take 1 tablet by mouth every 6 (six) hours as needed for pain.   90 tablet   0   . OXYCONTIN 40 MG T12A   Oral   Take 1 tablet (40 mg total) by mouth every 12 (twelve) hours.   60 tablet   0     Dispense as written.   . polyethylene glycol (MIRALAX / GLYCOLAX) packet   Oral   Take 17 g by mouth daily as needed. For constipation         . prochlorperazine (COMPAZINE) 10 MG tablet   Oral   Take 10 mg by mouth every 6 (six) hours as needed. For nausea         . senna-docusate (SENOKOT-S) 8.6-50 MG per tablet   Oral   Take 2 tablets by mouth at bedtime.          There were no vitals taken for this visit. Physical Exam  Nursing note and vitals reviewed. Constitutional: He is oriented to person, place, and time. He appears well-developed.  HENT:  Head: Normocephalic and atraumatic.  Eyes: Conjunctivae and EOM are normal. Pupils are equal, round, and reactive to light.  Neck: Normal range of motion. Neck supple.  Cardiovascular: Normal rate and regular rhythm.   Pulmonary/Chest: Effort normal and breath sounds normal.  Abdominal: Soft. Bowel sounds are normal. He exhibits no distension. There is tenderness. There is no rebound and no guarding.  Epigastric, RUQ tenderness  Neurological: He is alert and oriented to person, place, and time.  Skin: Skin is warm.    ED Course  Procedures (including critical care time) Labs Review Labs Reviewed  CBC WITH DIFFERENTIAL  COMPREHENSIVE METABOLIC PANEL  LIPASE, BLOOD  TYPE AND SCREEN   Imaging Review No results found.  EKG Interpretation   None       MDM  No diagnosis found.  Pt comes in with cc of abd pain and dib. His main concern is abd pain - which he describes as his cancer pain. Oral meds not as helpful. We will give iv meds here to see if we can break his pain.  Pt also has some dizziness, dib. Hx of anemia requiring transfusion - probably related to that. Will check CBC  and transfuse if needed.  PE considered in the ddx as well - but he is not tachycardic and nor does have any chest pains - and we have alternate diagnoses in anemia that is probably to explain his dib.  As fas as cancer is concerned, it is terminal he is no longer getting chemo/radiation.  Derwood Kaplan, MD 03/08/13 0425  6:20 AM Pt has PANCYTOPENIA. Unknown etiology. Has had anemia and neutropenia in the past -  due to chemo. Not on chemo now, so suspect other etiology. Neutropenic precautions started. Spoke with Heme-onc, goal is platelet > 10 K, Hb > 7 for now. Will start transfusion now. Has a med PORT. Will try to get a 2nd line.  Goals of care discussed - and patient CODE STATUS is DNR/DNI - but he does want to to be treated for now and get transfusions.  CRITICAL CARE Performed by: Derwood Kaplan   Total critical care time: 75 minutes  Critical care time was exclusive of separately billable procedures and treating other patients.  Critical care was necessary to treat or prevent imminent or life-threatening deterioration.  Critical care was time spent personally by me on the following activities: development of treatment plan with patient and/or surrogate as well as nursing, discussions with consultants, evaluation of patient's response to treatment, examination of patient, obtaining history from patient or surrogate, ordering and performing treatments and interventions, ordering and review of laboratory studies, ordering and review of radiographic studies, pulse oximetry and re-evaluation of patient's condition.   Derwood Kaplan, MD 03/08/13 [phone]

## 2013-03-08 NOTE — Progress Notes (Addendum)
IP PROGRESS NOTE  Subjective:   [name] is well-known to me with a history of metastatic pancreas cancer. He has most recently been cared for by Dr. Lisette Grinder at [facility] after completing treatment on a clinical trial they are. He was last treated with chemotherapy in July of this year. [name] is currently enrolled in hospice care.  He reports feeling "weak "and he continues to have abdominal pain. He reports becoming disoriented yesterday and he presented to the emergency room. He was admitted for evaluation and management of severe pancytopenia. He denies fever and bleeding. He has developed "chills "today. He is currently hungry. No focal neurologic symptoms or headache. No problem with the Port-A-Cath.  Objective: Vital signs in last 24 hours: Blood pressure 153/81, pulse 99, temperature 99.5 F (37.5 C), temperature source Oral, resp. rate 18, height 6\' 1"  (1.854 m), weight 116 lb 13.5 oz (53 kg), SpO2 98.00%.  Intake/Output from previous day: 10/23 0701 - 10/24 0700 In: 12.5 [Blood:12.5] Out: -   Physical Exam:  HEENT: No thrush or bleeding, mild edema at the malar area bilaterally, no neck mass or edema Lungs: Clear bilaterally Cardiac: Regular rate and rhythm Abdomen: No hepatosplenomegaly, no apparent ascites, mass in the right upper abdomen and at the right lateral costal margin Extremities: Trace pitting edema at the low leg and ankle bilaterally Neurologic: Alert and oriented, follows commands Skin: No petechiae or ecchymoses  Portacath/PICC-without erythema  Lab Results:  Recent Labs  03/08/13 0420 03/08/13 1730  WBC 1.0* 1.0*  HGB 3.6* 6.2*  HCT 11.1* 18.4*  PLT 6* 40*  40*   ANC 0.5  Peripheral blood sugar: Markedly decreased platelets, numerous ovalocytes, teardrops, and cigar cells. A few schistocytes. Nucleated red cells are present. The white cell morphology is unremarkable. The polychromasia isn't increased.  BMET  Recent Labs  03/08/13 0420   NA 133*  K 3.3*  CL 98  CO2 27  GLUCOSE 100*  BUN 22  CREATININE 0.87  CALCIUM 8.2*   total bilirubin 0.6  Studies/Results: Dg Abd Acute W/chest  03/08/2013   CLINICAL DATA:  Shortness of breath, abdominal pain, nausea  EXAM: ACUTE ABDOMEN SERIES (ABDOMEN 2 VIEW & CHEST 1 VIEW)  COMPARISON:  The prior CT from 05/01/2012  FINDINGS: Right-sided Port-A-Cath is in place with tip overlying the cavoatrial junction. Cardiac and mediastinal silhouettes are within normal limits.  The lungs are hyperinflated with attenuation of the pulmonary markings, consistent with emphysema. No focal infiltrate, pleural effusion, pulmonary edema, or pneumothorax is identified.  The visualized bowel gas pattern is nonspecific without evidence of obstruction or ileus. No abnormal bowel wall thickening is identified. No free intraperitoneal air. No soft tissue masses or abnormal calcifications. Suture material overlies the right upper quadrant.  Right total hip arthroplasty is partially visualized. Degenerative changes are present within the visualized spine. No acute osseous abnormality.  IMPRESSION: Negative abdominal radiographs. No acute cardiopulmonary disease. Emphysema.   Electronically Signed   By: Rise Mu M.D.   On: 03/08/2013 04:38    Medications: I have reviewed the patient's current medications.  Assessment/Plan:  1. Metastatic pancreas cancer-most recently treated with modified FOLFIRINOX at [facility], last given in July. Discontinued after disease progression confirmed on a CT scan. Currently enrolled in hospice care.  2. Pain secondary to metastatic pancreas cancer involving abdominal wall masses  3. Severe pancytopenia-the pancytopenia is most likely related to metastatic pancreas cancer involving the bone marrow or tumor related DIC. I have a low clinical suspicion  for a primary hematologic process or sepsis.    [name] has metastatic pancreas cancer that has progressed despite  several lines of systemic therapy. He is currently enrolled in hospice care and appears to be declining more rapidly. I recommend  comfort/supportive care to include narcotic analgesics, red cell transfusion support for comfort, and continued hospice care. He can no longer live alone and will need placement with hospice.  Recommendations:  1. Followup DIC screen, antibiotic therapy if there is clinical suspicion of an underlying infection  2. Transfuse packed red blood cells for comfort  3. Transfuse platelets for bleeding  4. Consult the [facility] hospice program to evaluate him for [facility]  5. Resume outpatient narcotic regimen   Oncology will check on him 03/09/2013. Please call as needed. I will see him on 03/11/2013.   LOS: 0 days   Christal Lagerstrom, Jillyn Hidden  03/08/2013, 5:48 PM

## 2013-03-08 NOTE — H&P (Signed)
[facility] History and Physical  [name] AVW:[id number] DOB: February 27, 1951 DOA: 03/08/2013  Referring physician: Dr. Rhunette [name] PCP: Default, Provider, MD  Specialists: oncology   Chief Complaint: shortness of breath, abdominal pain  HPI: [name] is a 62 y.o. male has a past medical history significant for end stage pancreatic cancer followed by Dr. Truett [name] here and also followed at [facility] s/p whipple sx and chemotherapy given at [facility], hx of HTN, low back pain, remote hx of drug abuse presents with weakness and shortness of breath. Patient offers little information to me as he clearly states that he wants blood and his pain medications. He does not remember when he last had chemotherapy but states that it has been more than a month ago. Per chart review, as of 10/02 patient stated that he is no longer going to [facility] for his needs and has set up a hospice nurse that helps him. He had an appointment few days ago here for blood work however he was too weak to come. He had a recent hospitalization 6 weeks ago for syncope/weakness and poor pain control at home and requiring blood transfusions then as well. In the ED his Hb is 3.6 and his platelets are 6. He denies chest pain, endorses abdominal pain, denies vomiting. Denies fever or chills. He denies blood in his stool or dark tarry stools. He has no blood in his urine.   Review of Systems: as per HPI otherwise negative.   Past Medical History  Diagnosis Date  . Diarrhea   . Nausea   . Rash     on abdomen from radiation   . Chronic back pain     r/t disc disease--w/bilateral leg pain  . History of drug abuse     Remote--heroin & cocaine  . 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 Hypertension   . Osteoarthritis   . Pancreatitis   . 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 Hx of radiation therapy 02/08/11 to 02/21/11    palliative- RUQ abd, R lateral hepatic mets  . Pancreatic cancer 03/2010     poorly differentiated adenocarcinoma  . Hx of radiation therapy 05/13/11 to 06/01/11    pelvis   Past Surgical History  Procedure Laterality Date  . Whipple procedure  02/08/2010  . Portacath placement  01/06/11    8/15/12tip in superior cavoatrial junction  . Hernia repair      inguinal hernia repair as a child  . Dx laparoscopy  01/13/2011  . Right total hip replacement  08/14/2010  . Hydrocele excision / repair     Social History:  reports that he has been smoking Cigarettes.  He has a 15 pack-year smoking history. He has never used smokeless tobacco. He reports that he does not drink alcohol or use illicit drugs.  Allergies  Allergen Reactions  . No Known Allergies     Family History  Problem Relation Age of Onset  . Cancer Mother   . Cancer Sister   . Cancer Sister    Prior to Admission medications   Medication Sig Start Date End Date Taking? Authorizing Provider  metoCLOPramide (REGLAN) 5 MG tablet Take 5 mg by mouth 3 (three) times daily before meals.   Yes Historical Provider, MD  metoprolol tartrate (LOPRESSOR) 25 MG tablet TAKE 1 TABLET BY MOUTH EVERY DAY 12/07/12  Yes Ladene Artist, MD  oxyCODONE-acetaminophen (PERCOCET) 10-650 MG per tablet Take 1 tablet by mouth every 6 (six) hours as needed for  pain. 01/18/13  Yes Dorothea Ogle, MD  polyethylene glycol [facility] / GLYCOLAX) packet Take 17 g by mouth daily as needed. For constipation   Yes Historical Provider, MD  prochlorperazine (COMPAZINE) 10 MG tablet Take 10 mg by mouth every 6 (six) hours as needed. For nausea   Yes Historical Provider, MD  senna-docusate (SENOKOT-S) 8.6-50 MG per tablet Take 2 tablets by mouth at bedtime. 05/04/12   Sorin Luanne Bras, MD   Physical Exam: Filed Vitals:   03/08/13 0900 03/08/13 0933 03/08/13 1000 03/08/13 1100  BP: 129/76 135/71 137/73 135/81  Pulse: 74 80 80 76  Temp:  98.4 F (36.9 C)  98.3 F (36.8 C)  TempSrc:  Oral  Oral  Resp: 17 12 14 14   Height:      Weight:      SpO2:  100% 100% 100% 100%    General:  NAD  Eyes: PERRL, EOMI, no scleral icterus  ENT: moist oropharynx  Neck: supple, no JVD  Cardiovascular: regular rate without MRG; 2+ peripheral pulses  Respiratory: CTA biL, good air movement without wheezing, rhonchi or crackles  Abdomen: soft, diffusely tender to palpation  Skin: no rashes  Musculoskeletal: no peripheral edema  Psychiatric: normal mood and affect  Neurologic: non focal  Labs on Admission:  Basic Metabolic Panel:  Recent Labs Lab 03/08/13 0420  NA 133*  K 3.3*  CL 98  CO2 27  GLUCOSE 100*  BUN 22  CREATININE 0.87  CALCIUM 8.2*   Liver Function Tests:  Recent Labs Lab 03/08/13 0420  AST 52*  ALT 31  ALKPHOS 147*  BILITOT 0.6  PROT 5.6*  ALBUMIN 2.1*    Recent Labs Lab 03/08/13 0420  LIPASE 49   CBC:  Recent Labs Lab 03/08/13 0420  WBC 1.0*  NEUTROABS 0.5*  HGB 3.6*  HCT 11.1*  MCV 99.1  PLT 6*   Radiological Exams on Admission: Dg Abd Acute W/chest  03/08/2013   CLINICAL DATA:  Shortness of breath, abdominal pain, nausea  EXAM: ACUTE ABDOMEN SERIES (ABDOMEN 2 VIEW & CHEST 1 VIEW)  COMPARISON:  The prior CT from 05/01/2012  FINDINGS: Right-sided Port-A-Cath is in place with tip overlying the cavoatrial junction. Cardiac and mediastinal silhouettes are within normal limits.  The lungs are hyperinflated with attenuation of the pulmonary markings, consistent with emphysema. No focal infiltrate, pleural effusion, pulmonary edema, or pneumothorax is identified.  The visualized bowel gas pattern is nonspecific without evidence of obstruction or ileus. No abnormal bowel wall thickening is identified. No free intraperitoneal air. No soft tissue masses or abnormal calcifications. Suture material overlies the right upper quadrant.  Right total hip arthroplasty is partially visualized. Degenerative changes are present within the visualized spine. No acute osseous abnormality.  IMPRESSION: Negative  abdominal radiographs. No acute cardiopulmonary disease. Emphysema.   Electronically Signed   By: Rise Mu M.D.   On: 03/08/2013 04:38   EKG: Independently reviewed.  Assessment/Plan Principal Problem:   Pancytopenia Active Problems:   Pancreatic cancer, s/p whipple 02/09/2010, T2N0   Pain in lower back   Abdominal pain   Right upper quadrant pain   Neutropenia   Anemia  Symptomatic anemia - likely due to bone marrow suppression. Transfuse 2U pRBC and recheck, likely needs more. Will try to get his Hb ~ 8. Pancytopenia - platelets critically low at [facility]. No evidence of bleeding. Transfuse 1U and recheck. Also neutropenic on precautions.  Dyspnea - likely due to #1 Abdominal pain - due to pancreatic cancer  likely.  Pancreatic cancer - it does not seem like he is getting any active chemo right now. Oncology consulted, appreciate input.   Code Status: patient has durable DNR/DNI  Family Communication: none  Disposition Plan: inpatient  Time spent: 96  Ahmeer Tuman M. Elvera Lennox, MD [facility] Pager [phone]  If 7PM-7AM, please contact night-coverage [email or web address] Password [email or web address] 03/08/2013, 11:15 AM

## 2013-03-09 DIAGNOSIS — G893 Neoplasm related pain (acute) (chronic): Secondary | ICD-10-CM

## 2013-03-09 DIAGNOSIS — C259 Malignant neoplasm of pancreas, unspecified: Secondary | ICD-10-CM

## 2013-03-09 DIAGNOSIS — D709 Neutropenia, unspecified: Secondary | ICD-10-CM

## 2013-03-09 LAB — CBC
MCHC: 34.6 g/dL (ref 30.0–36.0)
MCV: 86.9 fL (ref 78.0–100.0)
RBC: 2.83 MIL/uL — ABNORMAL LOW (ref 4.22–5.81)
RDW: 17.6 % — ABNORMAL HIGH (ref 11.5–15.5)

## 2013-03-09 LAB — BASIC METABOLIC PANEL
GFR calc Af Amer: >90 mL/min (ref >90)
GFR calc non Af Amer: >90 mL/min (ref >90)
Potassium: 3.1 mEq/L — ABNORMAL LOW (ref 3.5–5.1)
Sodium: 137 mEq/L (ref 135–145)

## 2013-03-09 MED ORDER — HYDROMORPHONE 0.3 MG/ML IV SOLN
INTRAVENOUS | Status: DC
  Administered 2013-03-09: 1 mg via INTRAVENOUS
  Administered 2013-03-09: 4.99 mg via INTRAVENOUS
  Administered 2013-03-09 – 2013-03-10 (×2): via INTRAVENOUS
  Administered 2013-03-10: 6.47 mg via INTRAVENOUS
  Administered 2013-03-10: 1.96 mg via INTRAVENOUS
  Filled 2013-03-09 (×2): qty 25

## 2013-03-09 MED ORDER — HYDROMORPHONE HCL PF 2 MG/ML IJ SOLN: 3.0000 mg | Freq: Once | INTRAMUSCULAR | Status: DC

## 2013-03-09 MED ORDER — HYDROMORPHONE HCL PF 2 MG/ML IJ SOLN
2.0000 mg | INTRAMUSCULAR | Status: DC | PRN
  Administered 2013-03-09 (×4): 2 mg via INTRAVENOUS
  Filled 2013-03-09 (×4): qty 1

## 2013-03-09 MED ORDER — POTASSIUM CHLORIDE CRYS ER 20 MEQ PO TBCR
40.0000 meq | EXTENDED_RELEASE_TABLET | Freq: Once | ORAL | Status: AC
  Administered 2013-03-09: 40 meq via ORAL
  Filled 2013-03-09: qty 2

## 2013-03-09 MED ORDER — MORPHINE SULFATE ER 30 MG PO TBCR
30.0000 mg | EXTENDED_RELEASE_TABLET | Freq: Two times a day (BID) | ORAL | Status: DC
  Administered 2013-03-09 (×2): 30 mg via ORAL
  Filled 2013-03-09 (×2): qty 1

## 2013-03-09 NOTE — Consult Note (Signed)
[name]   DOB:Nov 27, 1950   WU#:[id number]   VOZ#:[id number]  Subjective:  Discussed overnight events with his am Nurse [name].  He received a total of 4 units of pRBCs for comfort.  She is awaiting FOBT.  Pain meds increased.  Patient with a flat effect but denies pain this am.   Objective:  Filed Vitals:   03/09/13 0800  BP:   Pulse:   Temp: 98.4 F (36.9 C)  Resp:     Body mass index is 15.86 kg/(m^2).  Intake/Output Summary (Last 24 hours) at 03/09/13 0833 Last data filed at 03/09/13 0800  Gross per 24 hour  Intake 2615.41 ml  Output    900 ml  Net 1715.41 ml               Flat effect otherwise appeared to be comfortable; thin  Sclerae unicteric  Oropharynx clear  Lungs clear -- no rales or rhonchi in the anterior fields  Heart regular rate and rhythm  Abdomen TTP, +BS  MSK no peripheral edema  Neuro nonfocal  Portacath/PICC-without erythema  Labs:  Lab Results  Component Value Date   WBC 1.2* 03/09/2013   HGB 8.5* 03/09/2013   HCT 24.6* 03/09/2013   MCV 86.9 03/09/2013   PLT 30* 03/09/2013   NEUTROABS 0.5* 03/08/2013    Basic Metabolic Panel:  Recent Labs Lab 03/08/13 0420 03/09/13 0540  NA 133* 137  K 3.3* 3.1*  CL 98 105  CO2 27 25  GLUCOSE 100* 90  BUN 22 13  CREATININE 0.87 0.76  CALCIUM 8.2* 8.0*   GFR Estimated Creatinine Clearance: 75.7 ml/min (by C-G formula based on Cr of 0.76). Liver Function Tests:  Recent Labs Lab 03/08/13 0420  AST 52*  ALT 31  ALKPHOS 147*  BILITOT 0.6  PROT 5.6*  ALBUMIN 2.1*    Recent Labs Lab 03/08/13 0420  LIPASE 49   Coagulation profile  Recent Labs Lab 03/08/13 1730  INR 1.01    CBC:  Recent Labs Lab 03/08/13 0420 03/08/13 1730 03/09/13 0540  WBC 1.0* 1.0* 1.2*  NEUTROABS 0.5*  --   --   HGB 3.6* 6.2* 8.5*  HCT 11.1* 18.4* 24.6*  MCV 99.1 88.9 86.9  PLT 6* 40*  40* 30*   D-Dimer  Recent Labs  03/08/13 1730  DDIMER 1.57*   Results for [name] (MRN  [id number]) as of 03/09/2013 08:43  Ref. Range 03/08/2013 17:30  APTT Latest Range: 24-37 seconds 24   Results for [name] (MRN [id number]) as of 03/09/2013 08:43  Ref. Range 03/08/2013 17:30  Smear Review No range found SCHISTOCYTES PRESENT (2-5/hpf)  D-Dimer, Quant Latest Range: 0.00-0.48 ug/mL-FEU 1.57 (H)  Fibrinogen Latest Range: 204-475 mg/dL 884   Results for [name] (MRN [id number]) as of 03/09/2013 08:43  Ref. Range 03/08/2013 17:30  Smear Review No range found SCHISTOCYTES PRESENT (2-5/hpf)   Microbiology Recent Results (from the past 240 hour(s))  MRSA PCR SCREENING     Status: None   Collection Time    03/08/13  8:15 AM      Result Value Range Status   MRSA by PCR NEGATIVE  NEGATIVE Final   Comment:            The GeneXpert MRSA Assay (FDA     approved for NASAL specimens     only), is one component of a     comprehensive MRSA colonization     surveillance program. It is not     intended to diagnose  MRSA     infection nor to guide or     monitor treatment for     MRSA infections.   Studies:  Dg Abd Acute W/chest  03/08/2013   CLINICAL DATA:  Shortness of breath, abdominal pain, nausea  EXAM: ACUTE ABDOMEN SERIES (ABDOMEN 2 VIEW & CHEST 1 VIEW)  COMPARISON:  The prior CT from 05/01/2012  FINDINGS: Right-sided Port-A-Cath is in place with tip overlying the cavoatrial junction. Cardiac and mediastinal silhouettes are within normal limits.  The lungs are hyperinflated with attenuation of the pulmonary markings, consistent with emphysema. No focal infiltrate, pleural effusion, pulmonary edema, or pneumothorax is identified.  The visualized bowel gas pattern is nonspecific without evidence of obstruction or ileus. No abnormal bowel wall thickening is identified. No free intraperitoneal air. No soft tissue masses or abnormal calcifications. Suture material overlies the right upper quadrant.  Right total hip arthroplasty is partially visualized.  Degenerative changes are present within the visualized spine. No acute osseous abnormality.  IMPRESSION: Negative abdominal radiographs. No acute cardiopulmonary disease. Emphysema.   Electronically Signed   By: Rise Mu M.D.   On: 03/08/2013 04:38    Assessment: 62 y.o. with the following:  1. Metastatic pancreas cancer-most recently treated with modified FOLFIRINOX at [facility], last given in July. Discontinued after disease progression confirmed on a CT scan. Currently enrolled in hospice care.   2. Pain secondary to metastatic pancreas cancer involving abdominal wall masses   3. Severe pancytopenia-the pancytopenia is most likely related to metastatic pancreas cancer involving the bone marrow or tumor related DIC. I agree with a low clinical suspicion for a primary hematologic process or sepsis. DIC screen with elevated D-dimer, normal INT, aPTT, normal fibrogen and + schizocytes. Likely low grade DIC.     [name] has metastatic pancreas cancer that has progressed despite several lines of systemic therapy. He is currently enrolled in hospice care and appears to be declining more rapidly. I recommend comfort/supportive care to include narcotic analgesics, red cell transfusion support for comfort, and continued hospice care. He can no longer live alone and will need placement with hospice.    1.  Continue neutropenia precautions 2. Transfuse packed red blood cells for comfort  3. Transfuse platelets for bleeding  4. Consult the [facility] hospice program to evaluate him for [facility]  5. Resume outpatient narcotic regimen and titrate to comfort  LOS: 1 day  [name] Pagan, MD 03/09/2013  8:33 AM

## 2013-03-09 NOTE — Progress Notes (Signed)
[facility] PROGRESS NOTE  [name] ZOX:[id number] DOB: 1951-02-06 DOA: 03/08/2013 PCP: Default, Provider, MD  Assessment/Plan: Symptomatic anemia - likely due to bone marrow suppression and low grade DIC. Transfused a total of 4U pRBC. Hb better this morning.  Pancytopenia - platelets critically low at [facility] No evidence of bleeding. Transfuse 1U - better this morning. No evidence of bleeding  Dyspnea - likely due to #1. Improved.  Abdominal pain - due to pancreatic cancer likely. Controlled Pancreatic cancer - disease progressing despite several lines of chemotherapy. Consulted SW for residential hospice. His life expectancy less than 6 months.   Diet: regular Fluids: NS DVT Prophylaxis: SCD  Code Status: DNR Family Communication: none  Disposition Plan: residential hospice  Consultants:  Oncology  Procedures:  none   Antibiotics none  HPI/Subjective: Very flat affect. Somewhat ignoring me this morning but answers "yes"/"no" to questions.   Objective: Filed Vitals:   03/09/13 0215 03/09/13 0356 03/09/13 0800 03/09/13 0810  BP: 142/83   158/94  Pulse: 81 76  80  Temp: 98.4 F (36.9 C) 98.7 F (37.1 C) 98.4 F (36.9 C)   TempSrc: Axillary Oral Oral   Resp: 12 18  0  Height:      Weight:  54.5 kg (120 lb 2.4 oz)    SpO2: 99% 99%  96%    Intake/Output Summary (Last 24 hours) at 03/09/13 1139 Last data filed at 03/09/13 0900  Gross per 24 hour  Intake 2302.91 ml  Output    900 ml  Net 1402.91 ml   Filed Weights   03/08/13 0816 03/09/13 0356  Weight: 53 kg (116 lb 13.5 oz) 54.5 kg (120 lb 2.4 oz)    Exam:   General:  NAD  Cardiovascular: regular rate and rhythm, without MRG  Respiratory: clear Data Reviewed: Basic Metabolic Panel:  Recent Labs Lab 03/08/13 0420 03/09/13 0540  NA 133* 137  K 3.3* 3.1*  CL 98 105  CO2 27 25  GLUCOSE 100* 90  BUN 22 13  CREATININE 0.87 0.76  CALCIUM 8.2* 8.0*   Liver Function  Tests:  Recent Labs Lab 03/08/13 0420  AST 52*  ALT 31  ALKPHOS 147*  BILITOT 0.6  PROT 5.6*  ALBUMIN 2.1*    Recent Labs Lab 03/08/13 0420  LIPASE 49   CBC:  Recent Labs Lab 03/08/13 0420 03/08/13 1730 03/09/13 0540  WBC 1.0* 1.0* 1.2*  NEUTROABS 0.5*  --   --   HGB 3.6* 6.2* 8.5*  HCT 11.1* 18.4* 24.6*  MCV 99.1 88.9 86.9  PLT 6* 40*  40* 30*   Recent Results (from the past 240 hour(s))  MRSA PCR SCREENING     Status: None   Collection Time    03/08/13  8:15 AM      Result Value Range Status   MRSA by PCR NEGATIVE  NEGATIVE Final   Comment:            The GeneXpert MRSA Assay (FDA     approved for NASAL specimens     only), is one component of a     comprehensive MRSA colonization     surveillance program. It is not     intended to diagnose MRSA     infection nor to guide or     monitor treatment for     MRSA infections.     Studies: Dg Abd Acute W/chest  03/08/2013   CLINICAL DATA:  Shortness of breath, abdominal pain, nausea  EXAM: ACUTE  ABDOMEN SERIES (ABDOMEN 2 VIEW & CHEST 1 VIEW)  COMPARISON:  The prior CT from 05/01/2012  FINDINGS: Right-sided Port-A-Cath is in place with tip overlying the cavoatrial junction. Cardiac and mediastinal silhouettes are within normal limits.  The lungs are hyperinflated with attenuation of the pulmonary markings, consistent with emphysema. No focal infiltrate, pleural effusion, pulmonary edema, or pneumothorax is identified.  The visualized bowel gas pattern is nonspecific without evidence of obstruction or ileus. No abnormal bowel wall thickening is identified. No free intraperitoneal air. No soft tissue masses or abnormal calcifications. Suture material overlies the right upper quadrant.  Right total hip arthroplasty is partially visualized. Degenerative changes are present within the visualized spine. No acute osseous abnormality.  IMPRESSION: Negative abdominal radiographs. No acute cardiopulmonary disease. Emphysema.    Electronically Signed   By: Rise Mu M.D.   On: 03/08/2013 04:38    Scheduled Meds: . feeding supplement (ENSURE COMPLETE)  237 mL Oral BID BM  . metoCLOPramide  5 mg Oral TID AC  . senna-docusate  2 tablet Oral QHS   Continuous Infusions: . sodium chloride 50 mL/hr at 03/09/13 1013    Principal Problem:   Pancytopenia Active Problems:   Pancreatic cancer, s/p whipple 02/09/2010, T2N0   Pain in lower back   Abdominal pain   Right upper quadrant pain   Neutropenia   Anemia  Time spent: 15  Pamella Pert, MD [facility] Pager [phone]. If 7 PM - 7 AM, please contact night-coverage at [email or web address], password [facility] 03/09/2013, 11:39 AM  LOS: 1 day

## 2013-03-10 DIAGNOSIS — C7951 Secondary malignant neoplasm of bone: Secondary | ICD-10-CM

## 2013-03-10 LAB — OCCULT BLOOD X 1 CARD TO LAB, STOOL: Fecal Occult Bld: NEGATIVE

## 2013-03-10 LAB — BASIC METABOLIC PANEL
CO2: 23 mEq/L (ref 19–32)
Calcium: 7.8 mg/dL — ABNORMAL LOW (ref 8.4–10.5)
Chloride: 106 mEq/L (ref 96–112)
GFR calc Af Amer: >90 mL/min (ref >90)
Glucose, Bld: 86 mg/dL (ref 70–99)
Potassium: 3.4 mEq/L — ABNORMAL LOW (ref 3.5–5.1)
Sodium: 135 mEq/L (ref 135–145)

## 2013-03-10 LAB — CBC
MCH: 30.7 pg (ref 26.0–34.0)
Platelets: 19 10*3/uL — CL (ref 150–400)
RBC: 2.64 MIL/uL — ABNORMAL LOW (ref 4.22–5.81)
RDW: 18.1 % — ABNORMAL HIGH (ref 11.5–15.5)
WBC: 1 10*3/uL — CL (ref 4.0–10.5)

## 2013-03-10 MED ORDER — SODIUM CHLORIDE 0.9 % IV SOLN
2.0000 mg/h | INTRAVENOUS | Status: DC
  Administered 2013-03-10: 2 mg/h via INTRAVENOUS
  Filled 2013-03-10: qty 10

## 2013-03-10 MED ORDER — MORPHINE SULFATE 25 MG/ML IV SOLN: 2.0000 mg/h | INTRAVENOUS | Status: DC

## 2013-03-10 MED ORDER — DEXTROSE 5 % IV SOLN
2.0000 mg/h | INTRAVENOUS | Status: DC
  Filled 2013-03-10: qty 10

## 2013-03-10 MED ORDER — POLYETHYLENE GLYCOL 3350 17 G PO PACK
17.0000 g | PACK | Freq: Every day | ORAL | Status: DC
  Administered 2013-03-10 – 2013-03-11 (×2): 17 g via ORAL
  Filled 2013-03-10 (×6): qty 1

## 2013-03-10 MED ORDER — HYDROMORPHONE 0.3 MG/ML IV SOLN
INTRAVENOUS | Status: DC
  Administered 2013-03-10: 4.68 mg via INTRAVENOUS
  Administered 2013-03-10 (×2): via INTRAVENOUS
  Administered 2013-03-11: 4.96 mg via INTRAVENOUS
  Administered 2013-03-11: 06:00:00 via INTRAVENOUS
  Administered 2013-03-11: 6.43 mg via INTRAVENOUS
  Administered 2013-03-11 (×2): via INTRAVENOUS
  Administered 2013-03-11: 7.69 mg via INTRAVENOUS
  Administered 2013-03-11: 5.8 mg via INTRAVENOUS
  Administered 2013-03-11: 9 mg via INTRAVENOUS
  Administered 2013-03-11: via INTRAVENOUS
  Administered 2013-03-11: 7.08 mg via INTRAVENOUS
  Administered 2013-03-11: 01:00:00 via INTRAVENOUS
  Administered 2013-03-11: 11.6 mg via INTRAVENOUS
  Administered 2013-03-12: 14.33 mg via INTRAVENOUS
  Administered 2013-03-12 (×2): via INTRAVENOUS
  Administered 2013-03-12: 3.45 mg via INTRAVENOUS
  Administered 2013-03-12: 7.99 mg via INTRAVENOUS
  Administered 2013-03-12: 23:00:00 via INTRAVENOUS
  Administered 2013-03-12: 5.6 mg via INTRAVENOUS
  Administered 2013-03-13: 13:00:00 via INTRAVENOUS
  Administered 2013-03-13: 4.58 mg via INTRAVENOUS
  Administered 2013-03-13: 20:00:00 via INTRAVENOUS
  Administered 2013-03-13: 6.73 mg via INTRAVENOUS
  Administered 2013-03-13: 7.06 mg via INTRAVENOUS
  Administered 2013-03-13: 17:00:00 via INTRAVENOUS
  Administered 2013-03-13: 6.42 mg via INTRAVENOUS
  Administered 2013-03-13: 09:00:00 via INTRAVENOUS
  Administered 2013-03-13: 16.19 mg via INTRAVENOUS
  Administered 2013-03-13 – 2013-03-14 (×3): via INTRAVENOUS
  Administered 2013-03-14: 7.49 mg via INTRAVENOUS
  Administered 2013-03-14: 23:00:00 via INTRAVENOUS
  Administered 2013-03-14: 11.43 mg via INTRAVENOUS
  Administered 2013-03-14: 7.48 mg via INTRAVENOUS
  Administered 2013-03-14 – 2013-03-15 (×2): via INTRAVENOUS
  Administered 2013-03-15: 7.638 mg via INTRAVENOUS
  Administered 2013-03-15: 7.47 mg via INTRAVENOUS
  Administered 2013-03-15: 03:00:00 via INTRAVENOUS
  Filled 2013-03-10 (×26): qty 25

## 2013-03-10 MED ORDER — MORPHINE SULFATE 25 MG/ML IV SOLN
2.0000 mg/h | INTRAVENOUS | Status: DC
  Filled 2013-03-10: qty 10

## 2013-03-10 MED ORDER — MORPHINE SULFATE 2 MG/ML IJ SOLN: 4.0000 mg | INTRAMUSCULAR | Status: DC | PRN

## 2013-03-10 MED ORDER — MORPHINE BOLUS VIA INFUSION
4.0000 mg | INTRAVENOUS | Status: DC | PRN
  Administered 2013-03-10: 4 mg via INTRAVENOUS
  Filled 2013-03-10: qty 4

## 2013-03-10 MED ORDER — HYDROMORPHONE 0.3 MG/ML IV SOLN
INTRAVENOUS | Status: DC
  Administered 2013-03-10: 13:00:00 via INTRAVENOUS
  Filled 2013-03-10: qty 25

## 2013-03-10 MED ORDER — SENNOSIDES-DOCUSATE SODIUM 8.6-50 MG PO TABS
2.0000 | ORAL_TABLET | Freq: Two times a day (BID) | ORAL | Status: DC
  Administered 2013-03-10 – 2013-03-12 (×3): 2 via ORAL
  Administered 2013-03-12: 1 via ORAL
  Administered 2013-03-13 – 2013-03-15 (×5): 2 via ORAL
  Filled 2013-03-10 (×14): qty 2

## 2013-03-10 NOTE — Progress Notes (Signed)
Clinical Social Work Department BRIEF PSYCHOSOCIAL ASSESSMENT 03/10/2013  Patient:  [name],[name]     Account Number:  [id number]     Admit date:  03/08/2013  Clinical Social Worker:  Doroteo Glassman  Date/Time:  03/10/2013 03:20 PM  Referred by:  Physician  Date Referred:  03/10/2013 Referred for  Residential hospice placement   Other Referral:   Interview type:  Patient Other interview type:    PSYCHOSOCIAL DATA Living Status:  ALONE Admitted from facility:   Level of care:   Primary support name:  Remus Blake Primary support relationship to patient:  FAMILY Degree of support available:   unk    CURRENT CONCERNS Current Concerns  Post-Acute Placement   Other Concerns:    SOCIAL WORK ASSESSMENT / PLAN Met with Pt to discuss d/c plans.  Pt clearly tired and kept his eyes closed during assessment.    Pt confirmed that he is followed by [facility].  Pt stated that he's not familiar with [facility] but that he understands that it's a Residential Hospice facility; he'd like to go there.    Pt gave CSW permission to notify [facility] of his wish.    CSW thanked Pt for his time.   Notified [facility] liaison, Forrestine Him.   Weekday CSW to follow.   Assessment/plan status:  Psychosocial Support/Ongoing Assessment of Needs Other assessment/ plan:   Information/referral to community resources:   n/a--Pt wants BP    PATIENT'S/FAMILY'S RESPONSE TO PLAN OF CARE: Pt's response to his plan of care was resigination.  Pt was sleepy and it was hard to assess how he's coping with end of life.    Pt thanked CSW for time and assistance.

## 2013-03-10 NOTE — Progress Notes (Signed)
03/10/13 1230  Morphine Drip stopped by MD per patient request. Patient states the Morphine drip make him to drowsy. Wasted Morphine 1mg /1hr 90mL in sink with Arline Asp, Charity fundraiser.

## 2013-03-10 NOTE — Progress Notes (Signed)
CRITICAL VALUE ALERT  Critical value received:  Platelet count 192 Date of notification:  03/10/13  Time of notification:  0640  Critical value read back:yes  Nurse who received alert:  Gloriajean Dell RN  MD notified (1st page):  Daphane Shepherd  Time of first page: [phone]  MD notified (2nd page):  Time of second page:  Responding MD  M.Lynch  Time MD responded:  [phone]  No new orders at this time.

## 2013-03-10 NOTE — Progress Notes (Signed)
[facility] PROGRESS NOTE  [name] AVW:[id number] DOB: 04/10/1951 DOA: 03/08/2013 PCP: Default, Provider, MD  HPI/Subjective: - upset this morning. He does not like "how morphine feels" and wants that discontinued and to go back on dilaudid PCA. He wants to "be aware" of his surroundings and feels less drowsy on dilaudid.   Assessment/Plan: Symptomatic anemia - likely due to bone marrow suppression and low grade DIC. Transfused a total of 4U pRBC.  - Hb stable. No bleeding Pancytopenia - platelets critically low at [facility]. No evidence of bleeding. Transfuse 1U - Platelets trending down. No bleeding. Will transfuse for < 10. Dyspnea - likely due to #1. Improved.  Abdominal pain - due to pancreatic cancer likely. Controlled Pancreatic cancer - disease progressing despite several lines of chemotherapy. Consulted SW for residential hospice. His life expectancy less than 6 months.   Diet: regular Fluids: NS DVT Prophylaxis: SCD  Code Status: DNR Family Communication: none  Disposition Plan: residential hospice  Consultants:  Oncology  Procedures:  none   Antibiotics none  Objective: Filed Vitals:   03/09/13 2330 03/10/13 0345 03/10/13 0454 03/10/13 0848  BP:   145/85   Pulse:   84   Temp:   98.4 F (36.9 C)   TempSrc:   Oral   Resp: 20 13 13 16   Height:      Weight:      SpO2: 95% 97% 97% 98%    Intake/Output Summary (Last 24 hours) at 03/10/13 1110 Last data filed at 03/10/13 0600  Gross per 24 hour  Intake 1456.5 ml  Output    100 ml  Net 1356.5 ml   Filed Weights   03/08/13 0816 03/09/13 0356 03/09/13 1305  Weight: 53 kg (116 lb 13.5 oz) 54.5 kg (120 lb 2.4 oz) 59.8 kg (131 lb 13.4 oz)    Exam:   General:  NAD  Cardiovascular: regular rate and rhythm, without MRG  Respiratory: clear Data Reviewed: Basic Metabolic Panel:  Recent Labs Lab 03/08/13 0420 03/09/13 0540 03/10/13 0500  NA 133* 137 135  K 3.3* 3.1* 3.4*  CL 98 105  106  CO2 27 25 23   GLUCOSE 100* 90 86  BUN 22 13 12   CREATININE 0.87 0.76 0.69  CALCIUM 8.2* 8.0* 7.8*   Liver Function Tests:  Recent Labs Lab 03/08/13 0420  AST 52*  ALT 31  ALKPHOS 147*  BILITOT 0.6  PROT 5.6*  ALBUMIN 2.1*    Recent Labs Lab 03/08/13 0420  LIPASE 49   CBC:  Recent Labs Lab 03/08/13 0420 03/08/13 1730 03/09/13 0540 03/10/13 0500  WBC 1.0* 1.0* 1.2* 1.0*  NEUTROABS 0.5*  --   --   --   HGB 3.6* 6.2* 8.5* 8.1*  HCT 11.1* 18.4* 24.6* 23.3*  MCV 99.1 88.9 86.9 88.3  PLT 6* 40*  40* 30* 19*   Recent Results (from the past 240 hour(s))  MRSA PCR SCREENING     Status: None   Collection Time    03/08/13  8:15 AM      Result Value Range Status   MRSA by PCR NEGATIVE  NEGATIVE Final   Comment:            The GeneXpert MRSA Assay (FDA     approved for NASAL specimens     only), is one component of a     comprehensive MRSA colonization     surveillance program. It is not     intended to diagnose MRSA  infection nor to guide or     monitor treatment for     MRSA infections.     Studies: No results found.  Scheduled Meds: . feeding supplement (ENSURE COMPLETE)  237 mL Oral BID BM  . HYDROmorphone PCA 0.3 mg/mL   Intravenous Q4H  . metoCLOPramide  5 mg Oral TID AC  . polyethylene glycol  17 g Oral Q1400  . senna-docusate  2 tablet Oral BID   Continuous Infusions: . sodium chloride 50 mL/hr at 03/10/13 4782    Principal Problem:   Pancytopenia Active Problems:   Pancreatic cancer, s/p whipple 02/09/2010, T2N0   Pain in lower back   Abdominal pain   Right upper quadrant pain   Neutropenia   Anemia  Time spent: 25  Pamella Pert, MD [facility] Pager [phone]. If 7 PM - 7 AM, please contact night-coverage at [email or web address], password [facility] 03/10/2013, 11:10 AM  LOS: 2 days

## 2013-03-10 NOTE — Progress Notes (Signed)
PT Cancellation Note  Patient Details Name: Corian Handley MRN: [id number] DOB: 02-03-1951   Cancelled Treatment:    Reason Eval/Treat Not Completed: Fatigue/lethargy limiting ability to participate; c/o fatigue after ambulating to/from bathroom.  Attempted to encourage pt and explain PT plan, pt continued to decline and seems irritated about his situation.  Will attempt tomorrow.   WYNN,CYNDI 03/10/2013, 2:56 PM

## 2013-03-10 NOTE — Progress Notes (Signed)
[name]   DOB:[birth year]   ZO#:[id number]   JWJ#:[id number]  Subjective: eating breakfast in bed; very irritable; "I'm in pain; this isn't working." Had BM yesterday, tells me not hard. Mild nausea, no vomiting. No family in room   Objective: middle aged [place] American male examined in bed Filed Vitals:   03/10/13 0454  BP: 145/85  Pulse: 84  Temp: 98.4 F (36.9 C)  Resp: 13    Body mass index is 17.4 kg/(m^2).  Intake/Output Summary (Last 24 hours) at 03/10/13 0826 Last data filed at 03/10/13 0600  Gross per 24 hour  Intake 1606.5 ml  Output    100 ml  Net 1506.5 ml     Lungs -- no rales or rhonchi, auscultated anterolaterally  Heart regular rate and rhythm  Abdomen firm, diminished BS, NT to mild palpation  MSK no peripheral edema  Neuro nonfocal, well-oriented, irritable affect    CBG (last 3)  No results found for this basename: GLUCAP,  in the last 72 hours   Labs:  Lab Results  Component Value Date   WBC 1.0* 03/10/2013   HGB 8.1* 03/10/2013   HCT 23.3* 03/10/2013   MCV 88.3 03/10/2013   PLT 19* 03/10/2013   NEUTROABS 0.5* 03/08/2013    @LASTCHEMISTRY @  Urine Studies No results found for this basename: UACOL, UAPR, USPG, UPH, UTP, UGL, UKET, UBIL, UHGB, UNIT, UROB, ULEU, UEPI, UWBC, URBC, UBAC, CAST, CRYS, UCOM, BILUA,  in the last 72 hours  Basic Metabolic Panel:  Recent Labs Lab 03/08/13 0420 03/09/13 0540 03/10/13 0500  NA 133* 137 135  K 3.3* 3.1* 3.4*  CL 98 105 106  CO2 27 25 23   GLUCOSE 100* 90 86  BUN 22 13 12   CREATININE 0.87 0.76 0.69  CALCIUM 8.2* 8.0* 7.8*   GFR Estimated Creatinine Clearance: 83.1 ml/min (by C-G formula based on Cr of 0.69). Liver Function Tests:  Recent Labs Lab 03/08/13 0420  AST 52*  ALT 31  ALKPHOS 147*  BILITOT 0.6  PROT 5.6*  ALBUMIN 2.1*    Recent Labs Lab 03/08/13 0420  LIPASE 49   No results found for this basename: AMMONIA,  in the last 168 hours Coagulation profile  Recent  Labs Lab 03/08/13 1730  INR 1.01    CBC:  Recent Labs Lab 03/08/13 0420 03/08/13 1730 03/09/13 0540 03/10/13 0500  WBC 1.0* 1.0* 1.2* 1.0*  NEUTROABS 0.5*  --   --   --   HGB 3.6* 6.2* 8.5* 8.1*  HCT 11.1* 18.4* 24.6* 23.3*  MCV 99.1 88.9 86.9 88.3  PLT 6* 40*  40* 30* 19*   Cardiac Enzymes: No results found for this basename: CKTOTAL, CKMB, CKMBINDEX, TROPONINI,  in the last 168 hours BNP: No components found with this basename: POCBNP,  CBG: No results found for this basename: GLUCAP,  in the last 168 hours D-Dimer  Recent Labs  03/08/13 1730  DDIMER 1.57*   Hgb A1c No results found for this basename: HGBA1C,  in the last 72 hours Lipid Profile No results found for this basename: CHOL, HDL, LDLCALC, TRIG, CHOLHDL, LDLDIRECT,  in the last 72 hours Thyroid function studies No results found for this basename: TSH, T4TOTAL, FREET3, T3FREE, THYROIDAB,  in the last 72 hours Anemia work up No results found for this basename: VITAMINB12, FOLATE, FERRITIN, TIBC, IRON, RETICCTPCT,  in the last 72 hours Microbiology Recent Results (from the past 240 hour(s))  MRSA PCR SCREENING     Status: None   Collection  Time    03/08/13  8:15 AM      Result Value Range Status   MRSA by PCR NEGATIVE  NEGATIVE Final   Comment:            The GeneXpert MRSA Assay (FDA     approved for NASAL specimens     only), is one component of a     comprehensive MRSA colonization     surveillance program. It is not     intended to diagnose MRSA     infection nor to guide or     monitor treatment for     MRSA infections.      Studies:  No results found.  Assessment: 62 y.o. [name] man with  1. Metastatic pancreas cancer-most recently treated with modified FOLFIRINOX at [facility], last given in July. Discontinued after disease progression confirmed on a CT scan. No further cancer-specific therapy planned 2. Pain secondary to metastatic pancreas cancer involving abdominal wall masses  3.  Severe pancytopenia-most likely related to metastatic pancreas cancer involving the bone marrow and/or tumor related DIC. 4 malnutrition, severe 4. Advanced directives-- DNR in place-- not clear if HCPOA documented  Plan: [name] is very irritated this AM as pain is not controlled despite a combination of dilaudid IVP/PCA, morphioe po and percocet PRN; we discussed this at length and I am changing his narcotics to morphine alone; he will be on a drip and IV pushes PRN; goal is to bring pain down to a level of 3/10 oir less with patient alert. We will adjust his dosage as needed to achieve those goals  Patient lives alone; he will need placement preferably in [facility]; I have entered a SW consult to facilitate. Patient has a DNR but apparently not a HCPOA ("my family will take care of that." Once patient's pain is betyter controlled this issue shuld be clarified with him and he should be helped to declare a HCPOA and get the documents formalized  I have increased his bowel prophylaxis. No need to transfuse platelets unless bleeding or <10K.  Will follow with you   Lowella Dell, MD 03/10/2013  8:26 AM

## 2013-03-11 LAB — CBC
HCT: 23.4 % — ABNORMAL LOW (ref 39.0–52.0)
Hemoglobin: 7.9 g/dL — ABNORMAL LOW (ref 13.0–17.0)
MCHC: 33.8 g/dL (ref 30.0–36.0)
RDW: 18 % — ABNORMAL HIGH (ref 11.5–15.5)
WBC: 1.1 10*3/uL — CL (ref 4.0–10.5)

## 2013-03-11 LAB — PREPARE PLATELET PHERESIS: Unit division: 0

## 2013-03-11 LAB — BASIC METABOLIC PANEL
BUN: 9 mg/dL (ref 6–23)
Chloride: 106 mEq/L (ref 96–112)
GFR calc Af Amer: >90 mL/min (ref >90)
GFR calc non Af Amer: >90 mL/min (ref >90)
Potassium: 3.3 mEq/L — ABNORMAL LOW (ref 3.5–5.1)
Sodium: 136 mEq/L (ref 135–145)

## 2013-03-11 MED ORDER — OXYCODONE HCL 5 MG PO TABS
10.0000 mg | ORAL_TABLET | Freq: Four times a day (QID) | ORAL | Status: DC
  Administered 2013-03-11 – 2013-03-12 (×4): 10 mg via ORAL
  Filled 2013-03-11 (×5): qty 2

## 2013-03-11 MED ORDER — OXYCODONE HCL 5 MG PO TABS
10.0000 mg | ORAL_TABLET | ORAL | Status: DC | PRN
  Administered 2013-03-11 – 2013-03-15 (×17): 10 mg via ORAL
  Filled 2013-03-11 (×18): qty 2

## 2013-03-11 MED ORDER — VITAMINS A & D EX OINT
TOPICAL_OINTMENT | CUTANEOUS | Status: AC
  Administered 2013-03-11: 1
  Filled 2013-03-11: qty 5

## 2013-03-11 NOTE — Progress Notes (Signed)
IP PROGRESS NOTE  Subjective:   He continues to have abdominal pain. He developed somnolence on IV morphine.  Objective: Vital signs in last 24 hours: Blood pressure 152/82, pulse 86, temperature 98.9 F (37.2 C), temperature source Oral, resp. rate 16, height 6\' 1"  (1.854 m), weight 131 lb 13.4 oz (59.8 kg), SpO2 97.00%.  Intake/Output from previous day: 10/26 0701 - 10/27 0700 In: 2870 [P.O.:1680; I.V.:1190] Out: 100 [Urine:100]  Physical Exam:  HEENT: No thrush or bleeding  Abdomen: No hepatosplenomegaly, no apparent ascites, mass in the right upper abdomen and at the right lateral costal margin Neurologic: Alert and oriented, follows commands Skin: No petechiae or ecchymoses  Portacath/PICC-without erythema  Lab Results:  Recent Labs  03/10/13 0500 03/11/13 0605  WBC 1.0* 1.1*  HGB 8.1* 7.9*  HCT 23.3* 23.4*  PLT 19* 16*   ANC 0.5 on 03/08/2013   BMET  Recent Labs  03/10/13 0500 03/11/13 0605  NA 135 136  K 3.4* 3.3*  CL 106 106  CO2 23 25  GLUCOSE 86 89  BUN 12 9  CREATININE 0.69 0.59  CALCIUM 7.8* 7.7*   total bilirubin 0.6  Studies/Results: No results found.  Medications: I have reviewed the patient's current medications.  Assessment/Plan:  1. Metastatic pancreas cancer-most recently treated with modified FOLFIRINOX at [facility], last given in July. Discontinued after disease progression confirmed on a CT scan. Currently enrolled in hospice care.  2. Pain secondary to metastatic pancreas cancer involving abdominal wall masses  3. Severe pancytopenia-the pancytopenia is most likely related to metastatic pancreas cancer involving the bone marrow . No significant coagulopathy found on the DIC screen 03/08/2013  [name] appears to be declining secondary to metastatic pancreas cancer. The pancytopenia is most likely related to tumor involving the bone marrow. His prognosis is poor related to the pancreas cancer. I do not recommend a diagnostic bone  marrow biopsy. I discussed the option of a diagnostic bone marrow biopsy with [name] today. He agrees with a supportive care approach.  His performance status has declined over the past several weeks to months. He can no longer live alone. He agrees to placement at [facility]. He was taking oxycodone for breakthrough pain prior to hospital admission. He will begin a trial of oxycodone here.  Recommendations:  1. transfuse packed red blood cells for symptomatic anemia, transfuse platelets for bleeding  2. consult [facility] hospice for placement at [facility]  3. trial of oral oxycodone for pain     LOS: 3 days   Amina Menchaca  03/11/2013, 2:09 PM

## 2013-03-11 NOTE — Progress Notes (Addendum)
PT Cancellation Note  Patient Details Name: [name] MRN: [id number] DOB: [birth year]   Cancelled Treatment:    Reason Eval/Treat Not Completed: Patient declined. (Pt stated he walked in the hallway this morning holding IV pole and doesn't want to get up again. Will follow. )   Tamala Ser 03/11/2013, 11:45 AM [phone]

## 2013-03-11 NOTE — Progress Notes (Signed)
Pt lying in bed with eyes closed when writer arrived.  He was awake.  After Clinical research associate introduced self  Pt expressed disappointment in hospice for not being able to get a volunteer to clean his house right after he made the request.  Per Pt had he not made the decision to contact to contact EMS when he did he would not be here now (NOTE: pt did not contact [facility] prior to going to the ER.)  Pt reported that he will not make any decisions at this time because he feels that he is not able to think straight.  [facility] has the pt listed as a FULL CODE.  When writer approached the subject of his wishes in the event that his lungs and heart stop he restated that he will not make any decisions at this time.  He plans on speaking with his family.  Pt did report that he knows that he should not be living alone.  Writer unable to discuss [employer] with the pt due to his insistence that no further questions be asked.    Reviewed chart.  Pt went to the ER with complaints of Dyspnea.  HGB was 3.6, and Platelets were 6.  Pt is currently under hospice with dx of Pancreatic Ca with mets to liver and peritoneum and anemia.  This is a hospice related admission.   Please contact [facility] at [phone] with any Pt movements. Elijah Birk RN, [facility] RN, [facility] [place], [phone]

## 2013-03-11 NOTE — Care Management Note (Signed)
   CARE MANAGEMENT NOTE 03/11/2013  Patient:  [name],[name]   Account Number:  [id number]  Date Initiated:  03/11/2013  Documentation initiated by:  Sosaia Pittinger  Subjective/Objective Assessment:   62 yo male admitted with pancytopenia.     Action/Plan:   Home when stable   Anticipated DC Date:     Anticipated DC Plan:  HOME/SELF CARE      DC Planning Services  CM consult      Choice offered to / List presented to:  NA   DME arranged  NA      DME agency  NA     HH arranged  NA      HH agency  NA   Status of service:  In process, will continue to follow Medicare Important Message given?   (If response is "NO", the following Medicare IM given date fields will be blank) Date Medicare IM given:   Date Additional Medicare IM given:    Discharge Disposition:    Per UR Regulation:  Reviewed for med. necessity/level of care/duration of stay  If discussed at Long Length of Stay Meetings, dates discussed:    Comments:  03/11/13 1410 Norberta Stobaugh,RN,MSN [phone] Chart reviewed for utilization of services. Awaiting PT eval to further assess dc needs.

## 2013-03-11 NOTE — Progress Notes (Signed)
Rm 1309    [name]                [facility] SW Note:  Several family members present including pt's brother Jamaree Hosier, a nephew Jomarie Longs and sister in Occupational hygienist. Discussed pt's desires for placement. Discussed SNF and BP. Explained criteria for BP as well as services and cost. Explained that BP will provide comfort measures only. Completed BP financial assessment with pt. Pt is open to BP for care. SW will continue to follow to assist/ support as needed. Althia Forts , LCSW

## 2013-03-11 NOTE — Progress Notes (Signed)
CSW received phone call from [facility] SW, Trucia Truesdale.  Per [facility] SW, [facility] has been attempting to develop a plan for pt, but a plan has not yet been determined.  CSW discussed that recommendation at this time is for [facility].  [facility] SW, Trucia Truesdale requested CSW fax clinicals to [facility] for [employer] MD to review.  CSW faxed clinicals to [facility] and awaiting determination on if pt eligible for [facility] at this time.  CSW to continue to follow.  Jacklynn Lewis, MSW, LCSWA  Clinical Social Work [phone]

## 2013-03-11 NOTE — Progress Notes (Signed)
[facility] PROGRESS NOTE  [name] RUE:[id number] DOB: 01-09-1951 DOA: 03/08/2013 PCP: Default, Provider, MD  HPI/Subjective: - pain better this morning, but still significant. Wants to avoid morphine, will try oxycodone.   Assessment/Plan: Symptomatic anemia - likely due to bone marrow suppression and low grade DIC. Transfused a total of 4U pRBC.  - Hb stable but trending down. No bleeding; FOBT negative Pancytopenia - platelets critically low at [facility] on admission. No evidence of bleeding. Transfuse 1U - Platelets trending down. No bleeding. Will transfuse for < 10. Dyspnea - likely due to #1. Improved. Abdominal pain - due to pancreatic cancer likely.  Pancreatic cancer - disease progressing despite several lines of chemotherapy. Consulted SW for residential hospice. His life expectancy less than 6 months.   Diet: regular Fluids: NS DVT Prophylaxis: SCD  Code Status: DNR Family Communication: none  Disposition Plan: residential hospice  Consultants:  Oncology  Procedures:  none   Antibiotics none  Objective: Filed Vitals:   03/11/13 0020 03/11/13 0418 03/11/13 0500 03/11/13 0558  BP:   152/82   Pulse:   86   Temp:   98.9 F (37.2 C)   TempSrc:   Oral   Resp: 14 14 16 14   Height:      Weight:      SpO2: 97% 98% 97% 97%    Intake/Output Summary (Last 24 hours) at 03/11/13 0727 Last data filed at 03/10/13 1813  Gross per 24 hour  Intake 2132.5 ml  Output    100 ml  Net 2032.5 ml   Filed Weights   03/08/13 0816 03/09/13 0356 03/09/13 1305  Weight: 53 kg (116 lb 13.5 oz) 54.5 kg (120 lb 2.4 oz) 59.8 kg (131 lb 13.4 oz)    Exam:  General:  NAD  Cardiovascular: regular rate and rhythm, without MRG  Respiratory: clear  Data Reviewed: Basic Metabolic Panel:  Recent Labs Lab 03/08/13 0420 03/09/13 0540 03/10/13 0500 03/11/13 0605  NA 133* 137 135 136  K 3.3* 3.1* 3.4* 3.3*  CL 98 105 106 106  CO2 27 25 23 25   GLUCOSE 100* 90  86 89  BUN 22 13 12 9   CREATININE 0.87 0.76 0.69 0.59  CALCIUM 8.2* 8.0* 7.8* 7.7*   Liver Function Tests:  Recent Labs Lab 03/08/13 0420  AST 52*  ALT 31  ALKPHOS 147*  BILITOT 0.6  PROT 5.6*  ALBUMIN 2.1*    Recent Labs Lab 03/08/13 0420  LIPASE 49   CBC:  Recent Labs Lab 03/08/13 0420 03/08/13 1730 03/09/13 0540 03/10/13 0500 03/11/13 0605  WBC 1.0* 1.0* 1.2* 1.0* 1.1*  NEUTROABS 0.5*  --   --   --   --   HGB 3.6* 6.2* 8.5* 8.1* 7.9*  HCT 11.1* 18.4* 24.6* 23.3* 23.4*  MCV 99.1 88.9 86.9 88.3 89.7  PLT 6* 40*  40* 30* 19* 16*   Recent Results (from the past 240 hour(s))  MRSA PCR SCREENING     Status: None   Collection Time    03/08/13  8:15 AM      Result Value Range Status   MRSA by PCR NEGATIVE  NEGATIVE Final   Comment:            The GeneXpert MRSA Assay (FDA     approved for NASAL specimens     only), is one component of a     comprehensive MRSA colonization     surveillance program. It is not     intended to diagnose  MRSA     infection nor to guide or     monitor treatment for     MRSA infections.     Studies: No results found.  Scheduled Meds: . feeding supplement (ENSURE COMPLETE)  237 mL Oral BID BM  . HYDROmorphone PCA 0.3 mg/mL   Intravenous Q4H  . metoCLOPramide  5 mg Oral TID AC  . polyethylene glycol  17 g Oral Q1400  . senna-docusate  2 tablet Oral BID   Continuous Infusions: . sodium chloride 50 mL/hr at 03/11/13 0415    Principal Problem:   Pancytopenia Active Problems:   Pancreatic cancer, s/p whipple 02/09/2010, T2N0   Pain in lower back   Abdominal pain   Right upper quadrant pain   Neutropenia   Anemia  Pamella Pert, MD [facility] Pager [phone]. If 7 PM - 7 AM, please contact night-coverage at [email or web address], password [facility] 03/11/2013, 7:27 AM  LOS: 3 days

## 2013-03-12 LAB — TYPE AND SCREEN
Antibody Screen: NEGATIVE
Unit division: 0
Unit division: 0

## 2013-03-12 LAB — BASIC METABOLIC PANEL
BUN: 13 mg/dL (ref 6–23)
CO2: 24 mEq/L (ref 19–32)
Chloride: 102 mEq/L (ref 96–112)
GFR calc non Af Amer: >90 mL/min (ref >90)
Glucose, Bld: 118 mg/dL — ABNORMAL HIGH (ref 70–99)
Potassium: 3.3 mEq/L — ABNORMAL LOW (ref 3.5–5.1)
Sodium: 133 mEq/L — ABNORMAL LOW (ref 135–145)

## 2013-03-12 LAB — CBC
HCT: 24.5 % — ABNORMAL LOW (ref 39.0–52.0)
Hemoglobin: 8.2 g/dL — ABNORMAL LOW (ref 13.0–17.0)
MCV: 90.1 fL (ref 78.0–100.0)
Platelets: 11 10*3/uL — CL (ref 150–400)
RBC: 2.72 MIL/uL — ABNORMAL LOW (ref 4.22–5.81)
WBC: 1.4 10*3/uL — CL (ref 4.0–10.5)

## 2013-03-12 LAB — OCCULT BLOOD X 1 CARD TO LAB, STOOL: Fecal Occult Bld: POSITIVE — AB

## 2013-03-12 MED ORDER — OXYCODONE HCL 5 MG PO TABS
20.0000 mg | ORAL_TABLET | Freq: Four times a day (QID) | ORAL | Status: DC
  Administered 2013-03-12: 20 mg via ORAL
  Administered 2013-03-12: 10 mg via ORAL
  Administered 2013-03-13 – 2013-03-14 (×4): 20 mg via ORAL
  Administered 2013-03-14 – 2013-03-15 (×4): 10 mg via ORAL
  Filled 2013-03-12 (×9): qty 4

## 2013-03-12 NOTE — Progress Notes (Signed)
[facility] PROGRESS NOTE  Amar Sippel ZOX:[id number] DOB: September 07, 1950 DOA: 03/08/2013 PCP: Default, Provider, MD  HPI/Subjective: - pain decent this morning, thinks that the oxy is working. Very resistant to discontinuing the PCA today and easily irritable when discussing PCA.  Assessment/Plan: Symptomatic anemia - likely due to bone marrow suppression and low grade DIC. Transfused a total of 4U pRBC.  - Hb stable this morning. No bleeding; FOBT negative Pancytopenia - platelets critically low at [facility] on admission. No evidence of bleeding. Transfused 1U - Platelets trending down. No bleeding.  - platelets 11 this morning, will transfuse an additional 1U with palliative purposes.  Dyspnea - likely due to #1. Improved. Abdominal pain - due to pancreatic cancer likely.  - I increased his Oxycodone to 20 mg every 6 hours this morning, and think about discontinuing the PCA later today or tomorrow morning. Once his pain is better he will be able to go to [employer] [facility].  Pancreatic cancer - disease progressing despite several lines of chemotherapy. Consulted SW for residential hospice. His life expectancy less than 6 months.  - pending discharge to hospice tomorrow.  Diet: regular Fluids: NS DVT Prophylaxis: SCD  Code Status: DNR Family Communication: none  Disposition Plan: residential hospice, plan for 10/29  Consultants:  Oncology  Procedures:  none   Antibiotics none  Objective: Filed Vitals:   03/12/13 0437 03/12/13 0442 03/12/13 0525 03/12/13 0723  BP:   138/84   Pulse: 97 97 95   Temp:   98.7 F (37.1 C)   TempSrc:   Oral   Resp: 15 15 12 12   Height:      Weight:      SpO2: 97% 97% 98% 97%    Intake/Output Summary (Last 24 hours) at 03/12/13 0952 Last data filed at 03/12/13 8119  Gross per 24 hour  Intake 2720.41 ml  Output   1200 ml  Net 1520.41 ml   Filed Weights   03/08/13 0816 03/09/13 0356 03/09/13 1305  Weight: 53 kg (116 lb 13.5 oz)  54.5 kg (120 lb 2.4 oz) 59.8 kg (131 lb 13.4 oz)    Exam:  General:  NAD  Cardiovascular: regular rate and rhythm, without MRG  Respiratory: clear  Data Reviewed: Basic Metabolic Panel:  Recent Labs Lab 03/08/13 0420 03/09/13 0540 03/10/13 0500 03/11/13 0605 03/12/13 0810  NA 133* 137 135 136 133*  K 3.3* 3.1* 3.4* 3.3* 3.3*  CL 98 105 106 106 102  CO2 27 25 23 25 24   GLUCOSE 100* 90 86 89 118*  BUN 22 13 12 9 13   CREATININE 0.87 0.76 0.69 0.59 0.55  CALCIUM 8.2* 8.0* 7.8* 7.7* 7.6*   Liver Function Tests:  Recent Labs Lab 03/08/13 0420  AST 52*  ALT 31  ALKPHOS 147*  BILITOT 0.6  PROT 5.6*  ALBUMIN 2.1*    Recent Labs Lab 03/08/13 0420  LIPASE 49   CBC:  Recent Labs Lab 03/08/13 0420 03/08/13 1730 03/09/13 0540 03/10/13 0500 03/11/13 0605 03/12/13 0810  WBC 1.0* 1.0* 1.2* 1.0* 1.1* 1.4*  NEUTROABS 0.5*  --   --   --   --   --   HGB 3.6* 6.2* 8.5* 8.1* 7.9* 8.2*  HCT 11.1* 18.4* 24.6* 23.3* 23.4* 24.5*  MCV 99.1 88.9 86.9 88.3 89.7 90.1  PLT 6* 40*  40* 30* 19* 16* 11*   Recent Results (from the past 240 hour(s))  MRSA PCR SCREENING     Status: None   Collection Time  03/08/13  8:15 AM      Result Value Range Status   MRSA by PCR NEGATIVE  NEGATIVE Final   Comment:            The GeneXpert MRSA Assay (FDA     approved for NASAL specimens     only), is one component of a     comprehensive MRSA colonization     surveillance program. It is not     intended to diagnose MRSA     infection nor to guide or     monitor treatment for     MRSA infections.     Studies: No results found.  Scheduled Meds: . feeding supplement (ENSURE COMPLETE)  237 mL Oral BID BM  . HYDROmorphone PCA 0.3 mg/mL   Intravenous Q4H  . metoCLOPramide  5 mg Oral TID AC  . oxyCODONE  20 mg Oral Q6H  . polyethylene glycol  17 g Oral Q1400  . senna-docusate  2 tablet Oral BID   Continuous Infusions: . sodium chloride 1,000 mL (03/12/13 0015)    Principal  Problem:   Pancytopenia Active Problems:   Pancreatic cancer, s/p whipple 02/09/2010, T2N0   Pain in lower back   Abdominal pain   Right upper quadrant pain   Neutropenia   Anemia  Pamella Pert, MD [facility] Pager [phone]. If 7 PM - 7 AM, please contact night-coverage at [email or web address], password [facility] 03/12/2013, 9:52 AM  LOS: 4 days

## 2013-03-12 NOTE — Progress Notes (Signed)
IP PROGRESS NOTE  Subjective:   He reports good pain relief with the oxycodone and IV Dilaudid. No bleeding. He is agreeable to [facility].  Objective: Vital signs in last 24 hours: Blood pressure 139/88, pulse 93, temperature 99.1 F (37.3 C), temperature source Oral, resp. rate 16, height 6\' 1"  (1.854 m), weight 131 lb 13.4 oz (59.8 kg), SpO2 100.00%.  Intake/Output from previous day: 10/27 0701 - 10/28 0700 In: 2840.4 [P.O.:1400; I.V.:1440.4] Out: 1200 [Urine:1200]  Physical Exam: Not performed today  Portacath/PICC-without erythema  Lab Results:  Recent Labs  03/11/13 0605 03/12/13 0810  WBC 1.1* 1.4*  HGB 7.9* 8.2*  HCT 23.4* 24.5*  PLT 16* 11*   ANC 0.5 on 03/08/2013   BMET  Recent Labs  03/11/13 0605 03/12/13 0810  NA 136 133*  K 3.3* 3.3*  CL 106 102  CO2 25 24  GLUCOSE 89 118*  BUN 9 13  CREATININE 0.59 0.55  CALCIUM 7.7* 7.6*   total bilirubin 0.6  Studies/Results: No results found.  Medications: I have reviewed the patient's current medications.  Assessment/Plan:  1. Metastatic pancreas cancer-most recently treated with modified FOLFIRINOX at [facility], last given in July. Discontinued after disease progression confirmed on a CT scan. Currently enrolled in hospice care.  2. Pain secondary to metastatic pancreas cancer involving abdominal wall masses  3. Severe pancytopenia-the pancytopenia is most likely related to metastatic pancreas cancer involving the bone marrow . No significant coagulopathy found on the DIC screen 03/08/2013  Mr. Avakian appears unchanged today. No bleeding. The severe thrombocytopenia is most likely related to bone marrow involvement with pancreas cancer. The differential diagnosis includes a primary bone marrow process such as aplastic anemia or myelodysplasia. He could have ITP, but this would not explain the severe anemia and leukopenia. He agrees to comfort care and transferred to [facility]. Recommendations:  1.  transfuse packed red blood cells for symptomatic anemia, transfuse platelets for bleeding  2. transferred to [facility] with a bed is available  3. increase the scheduled oxycodone dose and continue as needed oxycodone     LOS: 4 days   Najla Aughenbaugh  03/12/2013, 2:14 PM

## 2013-03-12 NOTE — Progress Notes (Signed)
CSW continuing to follow for residential hospice placement.  CSW spoke with MD who stated that plan is to get pt pain under control and then transition to [facility] and MD is hopeful for tomorrow.  CSW contacted [facility] SW, Trucia Truesdale to notify and [facility] SW is in communication with [facility] to determine if [facility] bed will be available for tomorrow.  CSW awaiting response from [facility] SW, Trucia Truesdale re: [facility] bed availability.  CSW to continue to follow to assist with disposition needs.   Jacklynn Lewis, MSW, LCSWA  Clinical Social Work [phone]

## 2013-03-12 NOTE — Progress Notes (Signed)
Pt sitting up on then side of the bed when writer arrived.  Two family members present.  Pt reported that he has a bad headache.  Temp now 99.3.  Assigned RN aware.  Pt due for oxycodone at 4 pm per RN.  He used the PCA.  Per Pt he wants to take a nap and see how he feels when he wakes back up.    Reviewed chart.  Platelets started to trend downwards.  WBC-1.4.  Pt has agreed to [facility] placement. Oxycodone started.  Pt remains on the IV dilaudid.   Plan is to discharge once pain controlled and a bed is available at [facility].    This is a hospice related admission.  Please contact [facility] at [phone] with any pt movements.  Elijah Birk RN, [facility] RN, [facility] [place] [phone]

## 2013-03-13 LAB — PREPARE PLATELET PHERESIS: Unit division: 0

## 2013-03-13 MED ORDER — ENSURE COMPLETE PO LIQD: 237.0000 mL | ORAL | Status: DC | PRN

## 2013-03-13 NOTE — Progress Notes (Signed)
[facility] PROGRESS NOTE  [name] XBJ:[id number] DOB: 22-Jun-1950 DOA: 03/08/2013 PCP: Default, Provider, MD  Assessment/Plan: 1. Metastatic pancreatic cancer, having disease progression despite undergoing chemotherapy. Given the advanced stage of his illness, inpatient hospice have been elected. Today patient not feeling ready for this transfer, wishing to speak to Dr. Truett Perna. I notified social work and Sports coach, looking into possible transfer tomorrow morning. 2. Abdominal pain. Secondary to pancreatic cancer, continue Dilaudid PCA with when necessary oxycodone. Patient states that current regimen is working and does not want his pain regimen to be modified.  3. Pancytopenia. On 03/05/1999 1400 white count 1400 with platelet count of 11. Patient with advanced cancer. 4. Symptomatic anemia. Patient was transfused a total of 4 units of packed red blood cells.   Code Status: DO NOT RESUSCITATE Disposition Plan: Reassess tomorrow morning, however plan would be for discharge to inpatient hospice in a.m.   Consultants:  Medical oncology  Hospice    HPI/Subjective: Patient with history of metastatic pancreatic cancer, who is currently in room with hospice services. Plan had been to transfer to Inpatient Hospice today. This morning he became quickly irritated with me in mentioning discharge plans and transfer to the inpatient hospice facility today. He states not feeling ready for this today, and wanted to speak to Dr Truett Perna this afternoon about discharge plans. Otherwise he reports symptoms are controlled and wishes to continue the current regimen.    Objective: Filed Vitals:   03/13/13 1328  BP:   Pulse:   Temp:   Resp: 14    Intake/Output Summary (Last 24 hours) at 03/13/13 1427 Last data filed at 03/13/13 0730  Gross per 24 hour  Intake 1613.63 ml  Output   1325 ml  Net 288.63 ml   Filed Weights   03/08/13 0816 03/09/13 0356 03/09/13 1305  Weight: 53  kg (116 lb 13.5 oz) 54.5 kg (120 lb 2.4 oz) 59.8 kg (131 lb 13.4 oz)    Exam:   General:  Patient is irritated with my mentioning of discharge today. Otherwise reports doing well, quite adamant about not modifying his pain regimen.  Cardiovascular: Regular rate rhythm normal S1-S2  Respiratory: Lungs clear to auscultation  Abdomen: Patient continues to have some generalized tenderness to palpation   Musculoskeletal: No edema  Data Reviewed: Basic Metabolic Panel:  Recent Labs Lab 03/08/13 0420 03/09/13 0540 03/10/13 0500 03/11/13 0605 03/12/13 0810  NA 133* 137 135 136 133*  K 3.3* 3.1* 3.4* 3.3* 3.3*  CL 98 105 106 106 102  CO2 27 25 23 25 24   GLUCOSE 100* 90 86 89 118*  BUN 22 13 12 9 13   CREATININE 0.87 0.76 0.69 0.59 0.55  CALCIUM 8.2* 8.0* 7.8* 7.7* 7.6*   Liver Function Tests:  Recent Labs Lab 03/08/13 0420  AST 52*  ALT 31  ALKPHOS 147*  BILITOT 0.6  PROT 5.6*  ALBUMIN 2.1*    Recent Labs Lab 03/08/13 0420  LIPASE 49   No results found for this basename: AMMONIA,  in the last 168 hours CBC:  Recent Labs Lab 03/08/13 0420 03/08/13 1730 03/09/13 0540 03/10/13 0500 03/11/13 0605 03/12/13 0810  WBC 1.0* 1.0* 1.2* 1.0* 1.1* 1.4*  NEUTROABS 0.5*  --   --   --   --   --   HGB 3.6* 6.2* 8.5* 8.1* 7.9* 8.2*  HCT 11.1* 18.4* 24.6* 23.3* 23.4* 24.5*  MCV 99.1 88.9 86.9 88.3 89.7 90.1  PLT 6* 40*  40* 30* 19* 16*  11*   Cardiac Enzymes: No results found for this basename: CKTOTAL, CKMB, CKMBINDEX, TROPONINI,  in the last 168 hours BNP (last 3 results) No results found for this basename: PROBNP,  in the last 8760 hours CBG: No results found for this basename: GLUCAP,  in the last 168 hours  Recent Results (from the past 240 hour(s))  MRSA PCR SCREENING     Status: None   Collection Time    03/08/13  8:15 AM      Result Value Range Status   MRSA by PCR NEGATIVE  NEGATIVE Final   Comment:            The GeneXpert MRSA Assay (FDA      approved for NASAL specimens     only), is one component of a     comprehensive MRSA colonization     surveillance program. It is not     intended to diagnose MRSA     infection nor to guide or     monitor treatment for     MRSA infections.     Studies: No results found.  Scheduled Meds: . feeding supplement (ENSURE COMPLETE)  237 mL Oral BID BM  . HYDROmorphone PCA 0.3 mg/mL   Intravenous Q4H  . metoCLOPramide  5 mg Oral TID AC  . oxyCODONE  20 mg Oral Q6H  . polyethylene glycol  17 g Oral Q1400  . senna-docusate  2 tablet Oral BID   Continuous Infusions: . sodium chloride 1,000 mL (03/12/13 1916)    Principal Problem:   Pancytopenia Active Problems:   Pancreatic cancer, s/p whipple 02/09/2010, T2N0   Pain in lower back   Abdominal pain   Right upper quadrant pain   Neutropenia   Anemia    Time spent: 35 minutes    Jeralyn Bennett  [facility] Pager [phone]. If 7PM-7AM, please contact night-coverage at [email or web address], password [facility] 03/13/2013, 2:27 PM  LOS: 5 days

## 2013-03-13 NOTE — Progress Notes (Signed)
NUTRITION FOLLOW UP  Intervention:   - Nutrition signing off as plan is comfort care, please consult if needed.  - Will change Ensure Complete to PRN  Nutrition Dx:   Increased nutrient needs related to underweight and cancer as evidenced by pt's chart and BMI of 15.4 - ongoing  Goal:   Pt to meet >/= 90% of their estimated nutrition needs - not met, plan is comfort care    Assessment:   PO intake 50% of meals, has been refusing Ensure Complete half of the time. Noted life expectancy of less than 6 months per MD. Per oncology notes, plan is comfort care with d/c to [facility]. Pt's weight up 15 pounds since admission.   Height: Ht Readings from Last 1 Encounters:  03/09/13 6\' 1"  (1.854 m)    Weight Status:   Wt Readings from Last 1 Encounters:  03/09/13 131 lb 13.4 oz (59.8 kg)    Re-estimated needs:  Kcal: 1800-2000  Protein: 80-90 grams  Fluid: 1.8- 2 L/day  Skin: Intact   Diet Order: General   Intake/Output Summary (Last 24 hours) at 03/13/13 1423 Last data filed at 03/13/13 0730  Gross per 24 hour  Intake 1613.63 ml  Output   1325 ml  Net 288.63 ml    Last BM: 10/28   Labs:   Recent Labs Lab 03/10/13 0500 03/11/13 0605 03/12/13 0810  NA 135 136 133*  K 3.4* 3.3* 3.3*  CL 106 106 102  CO2 23 25 24   BUN 12 9 13   CREATININE 0.69 0.59 0.55  CALCIUM 7.8* 7.7* 7.6*  GLUCOSE 86 89 118*    CBG (last 3)  No results found for this basename: GLUCAP,  in the last 72 hours  Scheduled Meds: . feeding supplement (ENSURE COMPLETE)  237 mL Oral BID BM  . HYDROmorphone PCA 0.3 mg/mL   Intravenous Q4H  . metoCLOPramide  5 mg Oral TID AC  . oxyCODONE  20 mg Oral Q6H  . polyethylene glycol  17 g Oral Q1400  . senna-docusate  2 tablet Oral BID    Continuous Infusions: . sodium chloride 1,000 mL (03/12/13 1916)    Levon Hedger MS, RD, LDN [phone] Pager [phone] After Hours Pager     Levon Hedger MS, RD, LDN [phone] Pager [phone] After  Hours Pager

## 2013-03-13 NOTE — Progress Notes (Addendum)
CSW received voicemail message from [facility] SW, Trucia Truesdale that pt had bed available at [facility] today 10/29 if MD feels pt is medically stable for discharge.  CSW spoke with MD who reported that pt feels he is not yet ready for transfer to [facility] and pt very irritated with anyone trying to hurry him up in conversations, instructions, etc.   CSW discussed with RN who stated that pt requested to only speak with [facility] SW, Trucia Truesdale.  CSW notified [facility] SW.   CSW received return phone call from [facility] SW, Trucia Truesdale who stated that she had had discussion with pt, pt family, and [facility]. Per [facility] SW, [facility] can accept pt tomorrow and [facility] SW plans to discuss with pt and pt family in order to prepare for d/c to [facility].  CSW discussed with attending MD.  CSW noted that Dr. Truett Perna plans to visit pt this afternoon and will hopefully be able to assist pt with his hesitations about transitioning to [facility].   CSW to assist with pt discharge needs to [facility].  CSW appreciates assistance from [facility] RN and CSW.   Jacklynn Lewis, MSW, LCSWA  Clinical Social Work [phone]

## 2013-03-13 NOTE — Progress Notes (Signed)
Room WL 1309 [facility] RN Visit-R.Kathalene Sporer RN  Related admission to [facility] diagnosis of pancreatic cancer.   Pt is DNR code with OOF DNR on shadow chart.    Pt alert & oriented, sitting upright in bed, with complaints of pain @8 .5/10 in BLE, abd, back and a  headache.  Pt on Dilaudid drip and breakthrough pain meds.  Pt's brother entered during visit. Patient's home medication list is on shadow chart.   Pt states he is not ready for [facility] today - wants to talk with Dr. Truett Perna this afternoon.  Pt states he gets very irritated with anyone trying to hurry him up in conversations, instructions, etc.   Please call [facility] @ [phone]- ask for RN Liaison or after hours,ask for on-call RN with any hospice needs.   Thank you.  Joneen Boers, RN  [facility]  Hospice Liaison  [phone])

## 2013-03-13 NOTE — Progress Notes (Signed)
RM 1309  [name]                       [facility] MSW Note:  Met with pt who reported an ongoing headache. Explained to pt that MSW spoke with hospital CSW Suzanna who informed that his MD feels he is ready for discharge tomorrow. Informed pt that a bed will be available tomorrow 03/14/13 at [facility]. However, pt feels that he is not medically stable enough for discharge and stated that his family is in agreement. He intends to discuss his discharge with Dr. Alcide Evener in the morning and will request discharge no earlier than Friday. MSW explained that hospice does not dictate discharge but he will have a bed at [facility] when he is discharged from the hospital. MSW will follow up with pt/family and CSW tomorrow. [name],LCSW

## 2013-03-13 NOTE — Progress Notes (Signed)
IP PROGRESS NOTE  Subjective:   Events since 10/28 noted. No changes overnight. He reports good pain relief with the current schedule oxycodone/ Dilaudid IV and as needed oxycodone. No bleeding . No new labs today. He is agreeable to [facility] but wishes to stay another day in hospital, he states he is "not ready". Complains of a headache.  Objective: Vital signs in last 24 hours: Blood pressure 134/68, pulse 97, temperature 98.5 F (36.9 C), temperature source Oral, resp. rate 14, height 6\' 1"  (1.854 m), weight 131 lb 13.4 oz (59.8 kg), SpO2 98.00%.  Intake/Output from previous day: 10/28 0701 - 10/29 0700 In: 2146.7 [P.O.:830; I.V.:1304.2; Blood:12.5] Out: 1226 [Urine:1225; Stool:1]  Physical Exam: Not performed today  Portacath/PICC-without erythema  Lab Results:  Recent Labs  03/11/13 0605 03/12/13 0810  WBC 1.1* 1.4*  HGB 7.9* 8.2*  HCT 23.4* 24.5*  PLT 16* 11*   ANC 0.5 on 03/08/2013   BMET  Recent Labs  03/11/13 0605 03/12/13 0810  NA 136 133*  K 3.3* 3.3*  CL 106 102  CO2 25 24  GLUCOSE 89 118*  BUN 9 13  CREATININE 0.59 0.55  CALCIUM 7.7* 7.6*   total bilirubin 0.6  Studies/Results: No results found.  Medications:  reviewed the patient's current medications.  Assessment/Plan:  1. Metastatic pancreas cancer-most recently treated with modified FOLFIRINOX at [facility], last given in July. Discontinued after disease progression confirmed on a CT scan. Currently enrolled in hospice care.  2. Pain secondary to metastatic pancreas cancer involving abdominal wall masses  3. Severe pancytopenia-the pancytopenia is most likely related to metastatic pancreas cancer involving the bone marrow . No significant coagulopathy found on the DIC screen 03/08/2013  He appears unchanged. He is agreeable to [facility]. He would like to check a CBC on 03/14/2013. I will again review the peripheral blood smear and attempt to contact Dr. Lisette Grinder. At present I do not  recommend a bone marrow biopsy. Recommendations:  1. transfuse packed red blood cells for symptomatic anemia, transfuse platelets for bleeding  2. transferred to [facility] as per admitting team. Bed currently available  3.Continue  the scheduled oxycodone dose at 20 mg po q 6 hrs  and as needed oxycodone at 10 mg po q 4 hrs prn, along with IV Dilaudid as needed. He can likely be transitioned off of the PCA and use as needed bolus Dilaudid at [facility]   LOS: 5 days   [facility] E  03/13/2013, 9:22 AM The above note was edited by me. I discussed the current situation and disposition plans with [name] today.

## 2013-03-13 NOTE — Progress Notes (Signed)
PT Cancellation Note  Patient Details Name: [name] MRN: [id number] DOB: 12-13-1950   Cancelled Treatment:    Reason Eval/Treat Not Completed: Other (comment)chart reviewed and noted pt to transfer to [facility].   Rada Hay 03/13/2013, 2:25 PM Blanchard Kelch PT [phone]

## 2013-03-14 DIAGNOSIS — C7B8 Other secondary neuroendocrine tumors: Secondary | ICD-10-CM

## 2013-03-14 LAB — CBC
Hemoglobin: 7.3 g/dL — ABNORMAL LOW (ref 13.0–17.0)
MCH: 30.2 pg (ref 26.0–34.0)
MCHC: 33.5 g/dL (ref 30.0–36.0)
Platelets: 43 10*3/uL — ABNORMAL LOW (ref 150–400)
RDW: 17.3 % — ABNORMAL HIGH (ref 11.5–15.5)

## 2013-03-14 LAB — FERRITIN: Ferritin: 1394 ng/mL — ABNORMAL HIGH (ref 22–322)

## 2013-03-14 MED ORDER — OXYCODONE HCL 20 MG PO TABS: 20.0000 mg | ORAL_TABLET | Freq: Four times a day (QID) | ORAL | Status: AC

## 2013-03-14 MED ORDER — ENSURE COMPLETE PO LIQD: 237.0000 mL | ORAL | Status: AC | PRN

## 2013-03-14 NOTE — Progress Notes (Signed)
RM 1309 Sanda Klein          [facility] MSW Note:   Discussed pt's discharge with Dr. Darnelle Catalan, CSW Tresa Endo, pt's brother Leonette Most, pt's son and Pt. All in agreement that pt will be discharged tomorrow to [facility]. Althia Forts, LCSW

## 2013-03-14 NOTE — Progress Notes (Signed)
Room WL 1309 - [address] [place] -[facility] RN Visit-R.Torianne Laflam RN  Related admission to [facility] diagnosis of metastatic pancreatic cancer.   Pt is DNR code.    Pt alert & oriented, sitting up in bed, eating 90% breakfast.   No family present.   Patient's home medication list is on shadow chart.   Pt slated to be transferred to [facility] possibly Friday 10/31.  Per Dr. Kalman Drape notes, may receive transfusion prior to transfer.  Pt states headache has had some relief-continued low grade pain in back, abdomen, BLE.  Pt does complain of dizziness at times.   Please call [facility] @ [phone]- ask for RN Liaison or after hours,ask for on-call RN with any hospice needs.   Thank you.  Joneen Boers, RN  [facility]  Hospice Liaison  [phone])

## 2013-03-14 NOTE — Discharge Summary (Deleted)
Physician Discharge Summary  [name]:[id number] DOB: 1950-05-24 DOA: 03/08/2013  PCP: Default, Provider, MD Oncologist: Dr. Mancel Bale  Admit date: 03/08/2013 Discharge date: 03/14/2013  Recommendations for Outpatient Follow-up:  1. The patient is being discharged to residential hospice facility.  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  Diet recommendation: Low-sodium, heart healthy.  History of present illness:  [name] is an 62 y.o. male with a PMH of metastatic pancreatic cancer who was admitted on 03/08/2013 with severe pancytopenia.  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  -No current recommendations for further evaluation with bone marrow biopsy.  -Platelet count stable and rising over time. White blood cell count currently 1.3.  -Hemoglobin 7.3. Status post 4 units during this hospital stay.  Active Problems:  Pancreatic cancer, s/p whipple 02/09/2010 with metastasis in the right rectus abdominis muscle, right hepatic dome  -Recently treated with modified FOLFIRINOX at [facility], last given in July. Discontinued secondary to disease progression confirmed on CT scan. Dr. Truett Perna consulting.  -Transfer to residential hospice facility soon.  -Continue pain control efforts.  Cancer related pain in lower back / Abdominal pain / Right upper quadrant pain  -Treated with Dilaudid-HP by PCA, oxycodone when necessary.  Procedures:  None.  Consultations:  Dr. Mancel Bale, Oncology  Discharge Exam: Filed Vitals:   03/14/13 1029  BP:   Pulse:   Temp:   Resp: 13   Filed Vitals:   03/14/13 0054 03/14/13 0540 03/14/13 0648 03/14/13 1029  BP:  144/72    Pulse:  100    Temp:  99.2 F (37.3 C)    TempSrc:  Oral    Resp: 18 16 18 13   Height:      Weight:      SpO2: 99% 97% 97% 98%    Gen:  NAD Cardiovascular:  RRR, No M/R/G Respiratory: Lungs CTAB Gastrointestinal: Abdomen soft, NT/ND with normal active bowel sounds. Extremities: No C/E/C   Discharge Instructions  Discharge Orders   Future Orders Complete By Expires   Call MD for:  persistant nausea and vomiting  As directed    Call MD for:  severe uncontrolled pain  As directed    Call MD for:  temperature >100.4  As directed    Diet - low sodium heart healthy  As directed    Increase activity slowly  As directed        Medication List    STOP taking these medications       metoprolol tartrate 25 MG tablet  Commonly known as:  LOPRESSOR     oxyCODONE-acetaminophen 10-650 MG per tablet  Commonly known as:  PERCOCET      TAKE these medications       feeding supplement (ENSURE COMPLETE) Liqd  Take 237 mLs by mouth as needed (As desired by pt for additional nutrition).     metoCLOPramide 5 MG tablet  Commonly known as:  REGLAN  Take 5 mg by mouth 3 (three) times daily before meals.  Oxycodone HCl 20 MG Tabs  Take 1 tablet (20 mg total) by mouth every 6 (six) hours.     polyethylene glycol packet  Commonly known as:  MIRALAX / GLYCOLAX  Take 17 g by mouth daily as needed. For constipation     prochlorperazine 10 MG tablet  Commonly known as:  COMPAZINE  Take 10 mg by mouth every 6 (six) hours as needed. For nausea     senna-docusate 8.6-50 MG per tablet  Commonly known as:  Senokot-S  Take 2 tablets by mouth at bedtime.           Follow-up Information   Follow up with [name], MD. (As needed)    Specialty:  Oncology   Contact information:    [address] [place] [address] [phone]        The results of significant diagnostics from this hospitalization (including imaging, microbiology, ancillary and laboratory) are listed below for reference.    Significant Diagnostic Studies: Dg Abd Acute W/chest  03/08/2013   CLINICAL DATA:  Shortness of breath, abdominal pain, nausea  EXAM: ACUTE ABDOMEN SERIES (ABDOMEN 2 VIEW & CHEST 1 VIEW)  COMPARISON:  The prior CT from 05/01/2012  FINDINGS: Right-sided Port-A-Cath is in place with tip overlying the cavoatrial junction. Cardiac and mediastinal silhouettes are within normal limits.  The lungs are hyperinflated with attenuation of the pulmonary markings, consistent with emphysema. No focal infiltrate, pleural effusion, pulmonary edema, or pneumothorax is identified.  The visualized bowel gas pattern is nonspecific without evidence of obstruction or ileus. No abnormal bowel wall thickening is identified. No free intraperitoneal air. No soft tissue masses or abnormal calcifications. Suture material overlies the right upper quadrant.  Right total hip arthroplasty is partially visualized. Degenerative changes are present within the visualized spine. No acute osseous abnormality.  IMPRESSION: Negative abdominal radiographs. No acute cardiopulmonary disease. Emphysema.   Electronically Signed   By: Rise Mu M.D.   On: 03/08/2013 04:38    Labs:  Basic Metabolic Panel:  Recent Labs Lab 03/08/13 0420 03/09/13 0540 03/10/13 0500 03/11/13 0605 03/12/13 0810  NA 133* 137 135 136 133*  K 3.3* 3.1* 3.4* 3.3* 3.3*  CL 98 105 106 106 102  CO2 27 25 23 25 24   GLUCOSE 100* 90 86 89 118*  BUN 22 13 12 9 13   CREATININE 0.87 0.76 0.69 0.59 0.55  CALCIUM 8.2* 8.0* 7.8* 7.7* 7.6*   GFR Estimated Creatinine Clearance: 83.1 ml/min (by C-G formula based on Cr of 0.55). Liver Function Tests:  Recent Labs Lab 03/08/13 0420  AST 52*  ALT 31  ALKPHOS 147*  BILITOT 0.6   PROT 5.6*  ALBUMIN 2.1*    Recent Labs Lab 03/08/13 0420  LIPASE 49   Coagulation profile  Recent Labs Lab 03/08/13 1730  INR 1.01    CBC:  Recent Labs Lab 03/08/13 0420  03/09/13 0540 03/10/13 0500 03/11/13 0605 03/12/13 0810 03/14/13 0620  WBC 1.0*  < > 1.2* 1.0* 1.1* 1.4* 1.3*  NEUTROABS 0.5*  --   --   --   --   --   --   HGB 3.6*  < > 8.5* 8.1* 7.9* 8.2* 7.3*  HCT 11.1*  < > 24.6* 23.3* 23.4* 24.5* 21.8*  MCV 99.1  < > 86.9 88.3 89.7 90.1 90.1  PLT 6*  < > 30* 19* 16* 11* 43*  < > = values in this interval not displayed.  Microbiology Recent Results (from the past 240 hour(s))  MRSA PCR SCREENING     Status: None   Collection Time    03/08/13  8:15 AM      Result Value Range Status   MRSA by PCR NEGATIVE  NEGATIVE Final   Comment:            The GeneXpert MRSA Assay (FDA     approved for NASAL specimens     only), is one component of a     comprehensive MRSA colonization     surveillance program. It is not     intended to diagnose MRSA     infection nor to guide or     monitor treatment for     MRSA infections.    Time coordinating discharge: 35 minutes.  Signed:  Sabien Umland  Pager [phone] [facility] 03/14/2013, 11:32 AM

## 2013-03-14 NOTE — Progress Notes (Addendum)
IP PROGRESS NOTE  Subjective:   No headache this morning. No bleeding. Good pain control with the current regimen. He agrees to transfer to [facility] within the next few days. He feels "lightheaded "at times.  Objective: Vital signs in last 24 hours: Blood pressure 144/72, pulse 100, temperature 99.2 F (37.3 C), temperature source Oral, resp. rate 18, height 6\' 1"  (1.854 m), weight 131 lb 13.4 oz (59.8 kg), SpO2 97.00%.  Intake/Output from previous day: 10/29 0701 - 10/30 0700 In: 120 [P.O.:120] Out: 800 [Urine:800]  Physical Exam: HEENT: Mouth without bleeding Lungs: Clear bilaterally, no respiratory distress Cardiac: Regular rate and rhythm Abdomen: Right upper abdominal wall mass, mass at the right costal margin, mildly distended Vascular: No leg edema Skin: No petechiae or ecchymoses  Portacath/PICC-without erythema  Lab Results:  Recent Labs  03/12/13 0810 03/14/13 0620  WBC 1.4* 1.3*  HGB 8.2* 7.3*  HCT 24.5* 21.8*  PLT 11* 43*   ANC 0.5 on 03/08/2013  I reviewed the admission peripheral blood smear again today. The platelets are markedly decreased with no platelet clumps noted.  BMET  Recent Labs  03/12/13 0810  NA 133*  K 3.3*  CL 102  CO2 24  GLUCOSE 118*  BUN 13  CREATININE 0.55  CALCIUM 7.6*   total bilirubin 0.6  Studies/Results: No results found.  Medications:  reviewed the patient's current medications.  Assessment/Plan:  1. Metastatic pancreas cancer-most recently treated with modified FOLFIRINOX at [facility], last given in July. Discontinued after disease progression confirmed on a CT scan. Currently enrolled in hospice care.  2. Pain secondary to metastatic pancreas cancer involving abdominal wall masses  3. Severe pancytopenia-the pancytopenia is most likely related to metastatic pancreas cancer involving the bone marrow or treatment related myelodysplasia. No significant coagulopathy found on the DIC screen 03/08/2013  He appears  unchanged. He is agreeable to [facility]. I discussed the case with Dr. Lisette Grinder at [facility]. She feels the severe pancytopenia is most likely secondary to treatment-related myelodysplasia versus tumor involving the bone marrow. She does not recommend a diagnostic bone marrow biopsy. Dr. Lisette Grinder agrees with  comfort care.   Recommendations:  1. transfuse packed red blood cells for symptomatic anemia, transfuse platelets for bleeding. Consider another red cell transfusion prior to the [facility] transfer for palliation of symptoms.  2. transfer to [facility] as per admitting team.   3.Continue  the scheduled oxycodone dose at 20 mg po q 6 hrs  and as needed oxycodone at 10 mg po q 4 hrs prn, along with IV Dilaudid as needed. He can likely be transitioned off of the PCA and use as needed bolus Dilaudid at [facility]  4. Check vitamin B12 and ferritin levels to look for a treatable condition that may be contributing to the anemia.  I will follow him at [facility]. I will be out until 03/18/2013. Please call oncology as needed.   LOS: 6 days   Omunique Pederson  03/14/2013, 8:26 AM

## 2013-03-14 NOTE — Progress Notes (Signed)
[facility] PROGRESS NOTE  [name] ZOX:[id number] DOB: 07-06-1950 DOA: 03/08/2013 PCP: Default, Provider, MD  Brief narrative: [name] is an 62 y.o. male with a PMH of metastatic pancreatic cancer who was admitted on 03/08/2013 with severe pancytopenia.  Assessment/Plan: Principal Problem:   Pancytopenia with anemia, neutropenia, thrombocytopenia -Thought to be from treatment-related myelodysplasia versus tumor involving the bone marrow. -No current recommendations for further evaluation with bone marrow biopsy. -Platelet count stable and rising over time. White blood cell count currently 1.3. Hemoglobin 7.3. -Will give an additional unit of packed red blood cells today. Status post 4 units during this hospital stay. Active Problems:   Pancreatic cancer, s/p whipple 02/09/2010 with metastasis in the right rectus abdominis muscle, right hepatic dome -Recently treated with modified FOLFIRINOX at [facility], last given in July. Discontinued secondary to disease progression confirmed on CT scan.  Dr. Truett Perna consulting. -Transfer to residential hospice facility soon. -Continue pain control efforts.   Cancer related pain in lower back / Abdominal pain / Right upper quadrant pain -Continue Dilaudid-HP by PCA, oxycodone when necessary.  Code Status: DNR Family Communication: Son updated at bedside. Disposition Plan: [facility] when bed available.   Medical Consultants:  Dr. Mancel Bale, Oncology.  Other Consultants:  Physical therapy  Anti-infectives:  None.  HPI/Subjective: [name] has an angry affect.  He is refusing to go to a residential hospice facility today and tells me that his doctor wants him to have a unit of blood (I assume he means Dr. Truett Perna).  He tells me has chronic pain, but that the pain medication is controlling it. Bowels moved this morning. Denies dyspnea.  Objective: Filed Vitals:   03/14/13 0054 03/14/13 0540 03/14/13  0648 03/14/13 1029  BP:  144/72    Pulse:  100    Temp:  99.2 F (37.3 C)    TempSrc:  Oral    Resp: 18 16 18 13   Height:      Weight:      SpO2: 99% 97% 97% 98%    Intake/Output Summary (Last 24 hours) at 03/14/13 1106 Last data filed at 03/14/13 1000  Gross per 24 hour  Intake    360 ml  Output    775 ml  Net   -415 ml    Exam: Gen:  NAD, cachectic appearing Cardiovascular: Tachycardic, No M/R/G Respiratory:  Lungs diminished Gastrointestinal:  Abdomen soft, NT/ND, + BS Extremities:  No C/E/C  Data Reviewed: Basic Metabolic Panel:  Recent Labs Lab 03/08/13 0420 03/09/13 0540 03/10/13 0500 03/11/13 0605 03/12/13 0810  NA 133* 137 135 136 133*  K 3.3* 3.1* 3.4* 3.3* 3.3*  CL 98 105 106 106 102  CO2 27 25 23 25 24   GLUCOSE 100* 90 86 89 118*  BUN 22 13 12 9 13   CREATININE 0.87 0.76 0.69 0.59 0.55  CALCIUM 8.2* 8.0* 7.8* 7.7* 7.6*   GFR Estimated Creatinine Clearance: 83.1 ml/min (by C-G formula based on Cr of 0.55). Liver Function Tests:  Recent Labs Lab 03/08/13 0420  AST 52*  ALT 31  ALKPHOS 147*  BILITOT 0.6  PROT 5.6*  ALBUMIN 2.1*    Recent Labs Lab 03/08/13 0420  LIPASE 49   Coagulation profile  Recent Labs Lab 03/08/13 1730  INR 1.01    CBC:  Recent Labs Lab 03/08/13 0420  03/09/13 0540 03/10/13 0500 03/11/13 0605 03/12/13 0810 03/14/13 0620  WBC 1.0*  < > 1.2* 1.0* 1.1* 1.4* 1.3*  NEUTROABS 0.5*  --   --   --   --   --   --  HGB 3.6*  < > 8.5* 8.1* 7.9* 8.2* 7.3*  HCT 11.1*  < > 24.6* 23.3* 23.4* 24.5* 21.8*  MCV 99.1  < > 86.9 88.3 89.7 90.1 90.1  PLT 6*  < > 30* 19* 16* 11* 43*  < > = values in this interval not displayed.  Microbiology Recent Results (from the past 240 hour(s))  MRSA PCR SCREENING     Status: None   Collection Time    03/08/13  8:15 AM      Result Value Range Status   MRSA by PCR NEGATIVE  NEGATIVE Final   Comment:            The GeneXpert MRSA Assay (FDA     approved for NASAL specimens      only), is one component of a     comprehensive MRSA colonization     surveillance program. It is not     intended to diagnose MRSA     infection nor to guide or     monitor treatment for     MRSA infections.     Procedures and Diagnostic Studies: Dg Abd Acute W/chest  03/08/2013   CLINICAL DATA:  Shortness of breath, abdominal pain, nausea  EXAM: ACUTE ABDOMEN SERIES (ABDOMEN 2 VIEW & CHEST 1 VIEW)  COMPARISON:  The prior CT from 05/01/2012  FINDINGS: Right-sided Port-A-Cath is in place with tip overlying the cavoatrial junction. Cardiac and mediastinal silhouettes are within normal limits.  The lungs are hyperinflated with attenuation of the pulmonary markings, consistent with emphysema. No focal infiltrate, pleural effusion, pulmonary edema, or pneumothorax is identified.  The visualized bowel gas pattern is nonspecific without evidence of obstruction or ileus. No abnormal bowel wall thickening is identified. No free intraperitoneal air. No soft tissue masses or abnormal calcifications. Suture material overlies the right upper quadrant.  Right total hip arthroplasty is partially visualized. Degenerative changes are present within the visualized spine. No acute osseous abnormality.  IMPRESSION: Negative abdominal radiographs. No acute cardiopulmonary disease. Emphysema.   Electronically Signed   By: Rise Mu M.D.   On: 03/08/2013 04:38    Scheduled Meds: . HYDROmorphone PCA 0.3 mg/mL   Intravenous Q4H  . metoCLOPramide  5 mg Oral TID AC  . oxyCODONE  20 mg Oral Q6H  . polyethylene glycol  17 g Oral Q1400  . senna-docusate  2 tablet Oral BID   Continuous Infusions: . sodium chloride 50 mL/hr at 03/13/13 1644    Time spent: 25 minutes.   LOS: 6 days   Bryan Omura  [facility] Pager [phone].   *Please note that the hospitalists switch teams on Wednesdays. Please call the flow manager at [phone] if you are having difficulty reaching the hospitalist taking  care of this patient as she can update you and provide the most up-to-date pager number of provider caring for the patient. If 8PM-8AM, please contact night-coverage at [email or web address], password [facility]  03/14/2013, 11:06 AM

## 2013-03-15 LAB — TYPE AND SCREEN: Antibody Screen: NEGATIVE

## 2013-03-15 MED ORDER — HEPARIN SOD (PORK) LOCK FLUSH 100 UNIT/ML IV SOLN
500.0000 [IU] | INTRAVENOUS | Status: AC | PRN
  Administered 2013-03-15: 500 [IU]
  Filled 2013-03-15 (×2): qty 5

## 2013-03-15 NOTE — Plan of Care (Signed)
Problem: Discharge Progression Outcomes Goal: Discharge plan in place and appropriate Outcome: Completed/Met Date Met:  03/15/13 [facility]

## 2013-03-15 NOTE — Progress Notes (Signed)
Patient medically stable. DC'd to [facility] via [place]. Family at bedside.

## 2013-03-15 NOTE — Progress Notes (Signed)
Pt called this RN into the room. Pt's PCA monitoring was beeping and pt asked to have the PCA monitoring turned off. This RN informed pt that it is against policy to turn it off. Spoke with night coverage, who did not give orders to turn off the PCA monitoring. Pt informed, pt expressed displeasure when informed of this and pt stated, "I'm going to turn it off myself, it's driving me crazy and I can't get any sleep." This RN to monitor. Eugene Garnet RN

## 2013-03-15 NOTE — Discharge Summary (Signed)
Physician Discharge Summary  Garland WUJ:[id number] DOB: August 15, 1950 DOA: 03/08/2013  PCP: Default, Provider, MD  Admit date: 03/08/2013 Discharge date: 03/15/2013  Recommendations for Outpatient Follow-up:  1. The patient is being discharged to a residential hospice facility.  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   Diet recommendation: Low-sodium, heart healthy.   History of present illness:  [name] is an 62 y.o. male with a PMH of metastatic pancreatic cancer who was admitted on 03/08/2013 with severe pancytopenia.   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  -No current recommendations for further evaluation with bone marrow biopsy.  -Platelet count stable and rising over time. White blood cell count currently 1.3.  -Status post  units during this hospital stay.  Active Problems:  Pancreatic cancer, s/p whipple 02/09/2010 with metastasis in the right rectus abdominis muscle, right hepatic dome  -Recently treated with modified FOLFIRINOX at [facility], last given in July. Discontinued secondary to disease progression confirmed on CT scan. Dr. Truett Perna consulting.  -Transfer to residential hospice facility today.  -Continue pain control efforts.  Cancer related pain in lower back / Abdominal pain / Right upper quadrant pain  -Treated with Dilaudid-HP by PCA, oxycodone when necessary.  Procedures:  None. Consultations:  Dr. Mancel Bale, Oncology   Discharge Exam: Filed Vitals:   03/15/13 0615   BP: 123/80  Pulse: 94  Temp: 98.2 F (36.8 C)  Resp: 16   Filed Vitals:   03/14/13 2145 03/14/13 2328 03/15/13 0322 03/15/13 0615  BP: 122/80   123/80  Pulse: 102   94  Temp: 99.3 F (37.4 C)   98.2 F (36.8 C)  TempSrc: Oral   Oral  Resp: 16 16 16 16   Height:      Weight:      SpO2: 100% 98% 97% 94%    Gen:  NAD Cardiovascular:  RRR, No M/R/G Respiratory: Lungs CTAB Gastrointestinal: Abdomen soft, NT/ND with normal active bowel sounds. Extremities: No C/E/C   Discharge Instructions  Discharge Orders   Future Orders Complete By Expires   Call MD for:  persistant nausea and vomiting  As directed    Call MD for:  severe uncontrolled pain  As directed    Call MD for:  temperature >100.4  As directed    Diet - low sodium heart healthy  As directed    Increase activity slowly  As directed        Medication List    STOP taking these medications       metoprolol tartrate 25 MG tablet  Commonly known as:  LOPRESSOR     oxyCODONE-acetaminophen 10-650 MG per tablet  Commonly known as:  PERCOCET      TAKE these medications       feeding supplement (ENSURE COMPLETE) Liqd  Take 237 mLs by mouth as needed (As desired by pt for additional nutrition).     metoCLOPramide 5 MG tablet  Commonly known as:  REGLAN  Take 5 mg by mouth 3 (three) times daily before meals.     Oxycodone  HCl 20 MG Tabs  Take 1 tablet (20 mg total) by mouth every 6 (six) hours.     polyethylene glycol packet  Commonly known as:  MIRALAX / GLYCOLAX  Take 17 g by mouth daily as needed. For constipation     prochlorperazine 10 MG tablet  Commonly known as:  COMPAZINE  Take 10 mg by mouth every 6 (six) hours as needed. For nausea     senna-docusate 8.6-50 MG per tablet  Commonly known as:  Senokot-S  Take 2 tablets by mouth at bedtime.           Follow-up Information   Follow up with [name], MD. (As needed)    Specialty:  Oncology   Contact information:   [address]  [address] [place] [address] [phone]        The results of significant diagnostics from this hospitalization (including imaging, microbiology, ancillary and laboratory) are listed below for reference.    Significant Diagnostic Studies: Dg Abd Acute W/chest  03/08/2013   CLINICAL DATA:  Shortness of breath, abdominal pain, nausea  EXAM: ACUTE ABDOMEN SERIES (ABDOMEN 2 VIEW & CHEST 1 VIEW)  COMPARISON:  The prior CT from 05/01/2012  FINDINGS: Right-sided Port-A-Cath is in place with tip overlying the cavoatrial junction. Cardiac and mediastinal silhouettes are within normal limits.  The lungs are hyperinflated with attenuation of the pulmonary markings, consistent with emphysema. No focal infiltrate, pleural effusion, pulmonary edema, or pneumothorax is identified.  The visualized bowel gas pattern is nonspecific without evidence of obstruction or ileus. No abnormal bowel wall thickening is identified. No free intraperitoneal air. No soft tissue masses or abnormal calcifications. Suture material overlies the right upper quadrant.  Right total hip arthroplasty is partially visualized. Degenerative changes are present within the visualized spine. No acute osseous abnormality.  IMPRESSION: Negative abdominal radiographs. No acute cardiopulmonary disease. Emphysema.   Electronically Signed   By: Rise Mu M.D.   On: 03/08/2013 04:38    Labs:  Basic Metabolic Panel:  Recent Labs Lab 03/09/13 0540 03/10/13 0500 03/11/13 0605 03/12/13 0810  NA 137 135 136 133*  K 3.1* 3.4* 3.3* 3.3*  CL 105 106 106 102  CO2 25 23 25 24   GLUCOSE 90 86 89 118*  BUN 13 12 9 13   CREATININE 0.76 0.69 0.59 0.55  CALCIUM 8.0* 7.8* 7.7* 7.6*   GFR Estimated Creatinine Clearance: 83.1 ml/min (by C-G formula based on Cr of 0.55).  Coagulation profile  Recent Labs Lab 03/08/13 1730  INR 1.01    CBC:  Recent Labs Lab 03/09/13 0540 03/10/13 0500 03/11/13 0605 03/12/13 0810 03/14/13 0620   WBC 1.2* 1.0* 1.1* 1.4* 1.3*  HGB 8.5* 8.1* 7.9* 8.2* 7.3*  HCT 24.6* 23.3* 23.4* 24.5* 21.8*  MCV 86.9 88.3 89.7 90.1 90.1  PLT 30* 19* 16* 11* 43*   Anemia work up  Recent Labs  03/14/13 1011  VITAMINB12 >2000*  FERRITIN 1394*   Microbiology Recent Results (from the past 240 hour(s))  MRSA PCR SCREENING     Status: None   Collection Time    03/08/13  8:15 AM      Result Value Range Status   MRSA by PCR NEGATIVE  NEGATIVE Final   Comment:            The GeneXpert MRSA Assay (FDA     approved for NASAL specimens     only), is one component of a     comprehensive MRSA colonization  surveillance program. It is not     intended to diagnose MRSA     infection nor to guide or     monitor treatment for     MRSA infections.    Time coordinating discharge: 35 minutes.  Signed:  Amariona [name]  Pager [phone] [facility] 03/15/2013, 7:09 AM

## 2013-03-15 NOTE — Progress Notes (Signed)
Pt d/c to [facility] today via ambulance transport.  Cori Razor LCSW [phone]

## 2013-03-21 ENCOUNTER — Telehealth: Payer: Self-pay | Admitting: *Deleted

## 2013-03-21 NOTE — Telephone Encounter (Signed)
Call from Garth Bigness, RN at [facility]. Pt is requesting a blood transfusion. Dennie Bible is requesting order for CBC. OK, per Dr. Truett Perna. She reports his behaviors are escalating now that his family have all gone back home.

## 2013-03-29 ENCOUNTER — Telehealth: Payer: Self-pay | Admitting: *Deleted

## 2013-03-29 NOTE — Telephone Encounter (Signed)
Wanted MD aware that he has become alert, cheerful, pleasant and talkative to staff. Positive interactions and thanking staff for their care. Ambulatory and eating well. Calling people and asking them to bring him sandwiches from different shops from area he used to live in. Occasionally inappropriate-brought his underwear up to nursing station today and did not seem to understand what to do with it. Just wanted MD aware.

## 2013-04-16 ENCOUNTER — Encounter: Payer: Self-pay | Admitting: *Deleted

## 2013-04-16 NOTE — Progress Notes (Signed)
04/16/13 at 1:14pm - The pt is in hospice care at [facility]. Dr. Truett Perna visits the pt every Tuesday.  Dr. Truett Perna stated the pt was fine this am.  He states his blood work has "improved".  The pt is eating and walking to the nurse's station often.  The pt is stable at this time.  Will continue to check on this pt's status monthly for the [facility] 04-22 study.

## 2013-05-03 ENCOUNTER — Encounter: Payer: Self-pay | Admitting: Oncology

## 2013-05-20 ENCOUNTER — Encounter: Payer: Self-pay | Admitting: *Deleted

## 2013-05-20 ENCOUNTER — Telehealth: Payer: Self-pay | Admitting: *Deleted

## 2013-05-20 NOTE — Telephone Encounter (Signed)
Calling to report feet/ankles are swollen up to her knees-no pitting. Says there is burning in his feet when he puts them down. Patient insistent that she call even though MD rounds there every Tuesday. Made MD aware of patient complaint. Per Dr. Benay Spice, he will evaluate this tomorrow at his visit. Nurse made aware.

## 2013-05-20 NOTE — Progress Notes (Signed)
05/20/13 at 9:04am - [facility] follow up note:  The pt's monthly follow up is due, and Dr. Benay Spice states he still sees the pt every Tuesday.  He states the pt is stable, but he does have some episodes of confusion.  He states the pt still goes outside and smokes on a regular basis. The research nurse spoke directly to the patient on 05/08/13 to wish him a "Merry Christmas".  Will continue to check on this pt's status monthly for the [facility] 04-22 study.

## 2013-06-04 ENCOUNTER — Ambulatory Visit (HOSPITAL_BASED_OUTPATIENT_CLINIC_OR_DEPARTMENT_OTHER): Payer: Medicaid Other | Admitting: Oncology

## 2013-06-04 DIAGNOSIS — C259 Malignant neoplasm of pancreas, unspecified: Secondary | ICD-10-CM

## 2013-06-05 NOTE — Progress Notes (Signed)
See notes in [facility] chart.  He was seen at [facility] place on 06/04/2013

## 2013-06-16 NOTE — Progress Notes (Signed)
Witnessed waste of 90 ml Morphine in sink with Diana Eves.

## 2013-06-18 ENCOUNTER — Encounter: Payer: Self-pay | Admitting: *Deleted

## 2013-06-18 NOTE — Progress Notes (Signed)
06/18/13 at 9:14am - [facility] 04-22 monthly follow up - The research nurse spoke to Dr. Benay Spice this am about the pt's status to complete his monthly study follow up.  Dr. Benay Spice stated that the patient is still at [facility] facility.  Dr. Benay Spice visited the patient this am.  He said the patient is now "bed-bound" and is declining.  He said that the patient was given thorazine and ativan last night.  The research nurse will continue following this patient monthly for reporting purposes.

## 2013-07-14 DEATH — deceased

## 2014-01-07 ENCOUNTER — Other Ambulatory Visit: Payer: Self-pay | Admitting: *Deleted

## 2014-07-14 IMAGING — CR DG CHEST 2V
2 series · 2 of 2 positions shown · non-contrast
Comparison: 02/23/2012

CLINICAL DATA: Short of breath.  Cough.  Abdominal pain.
Pancreatic carcinoma.

CHEST - 2 VIEW

[w chest pa]
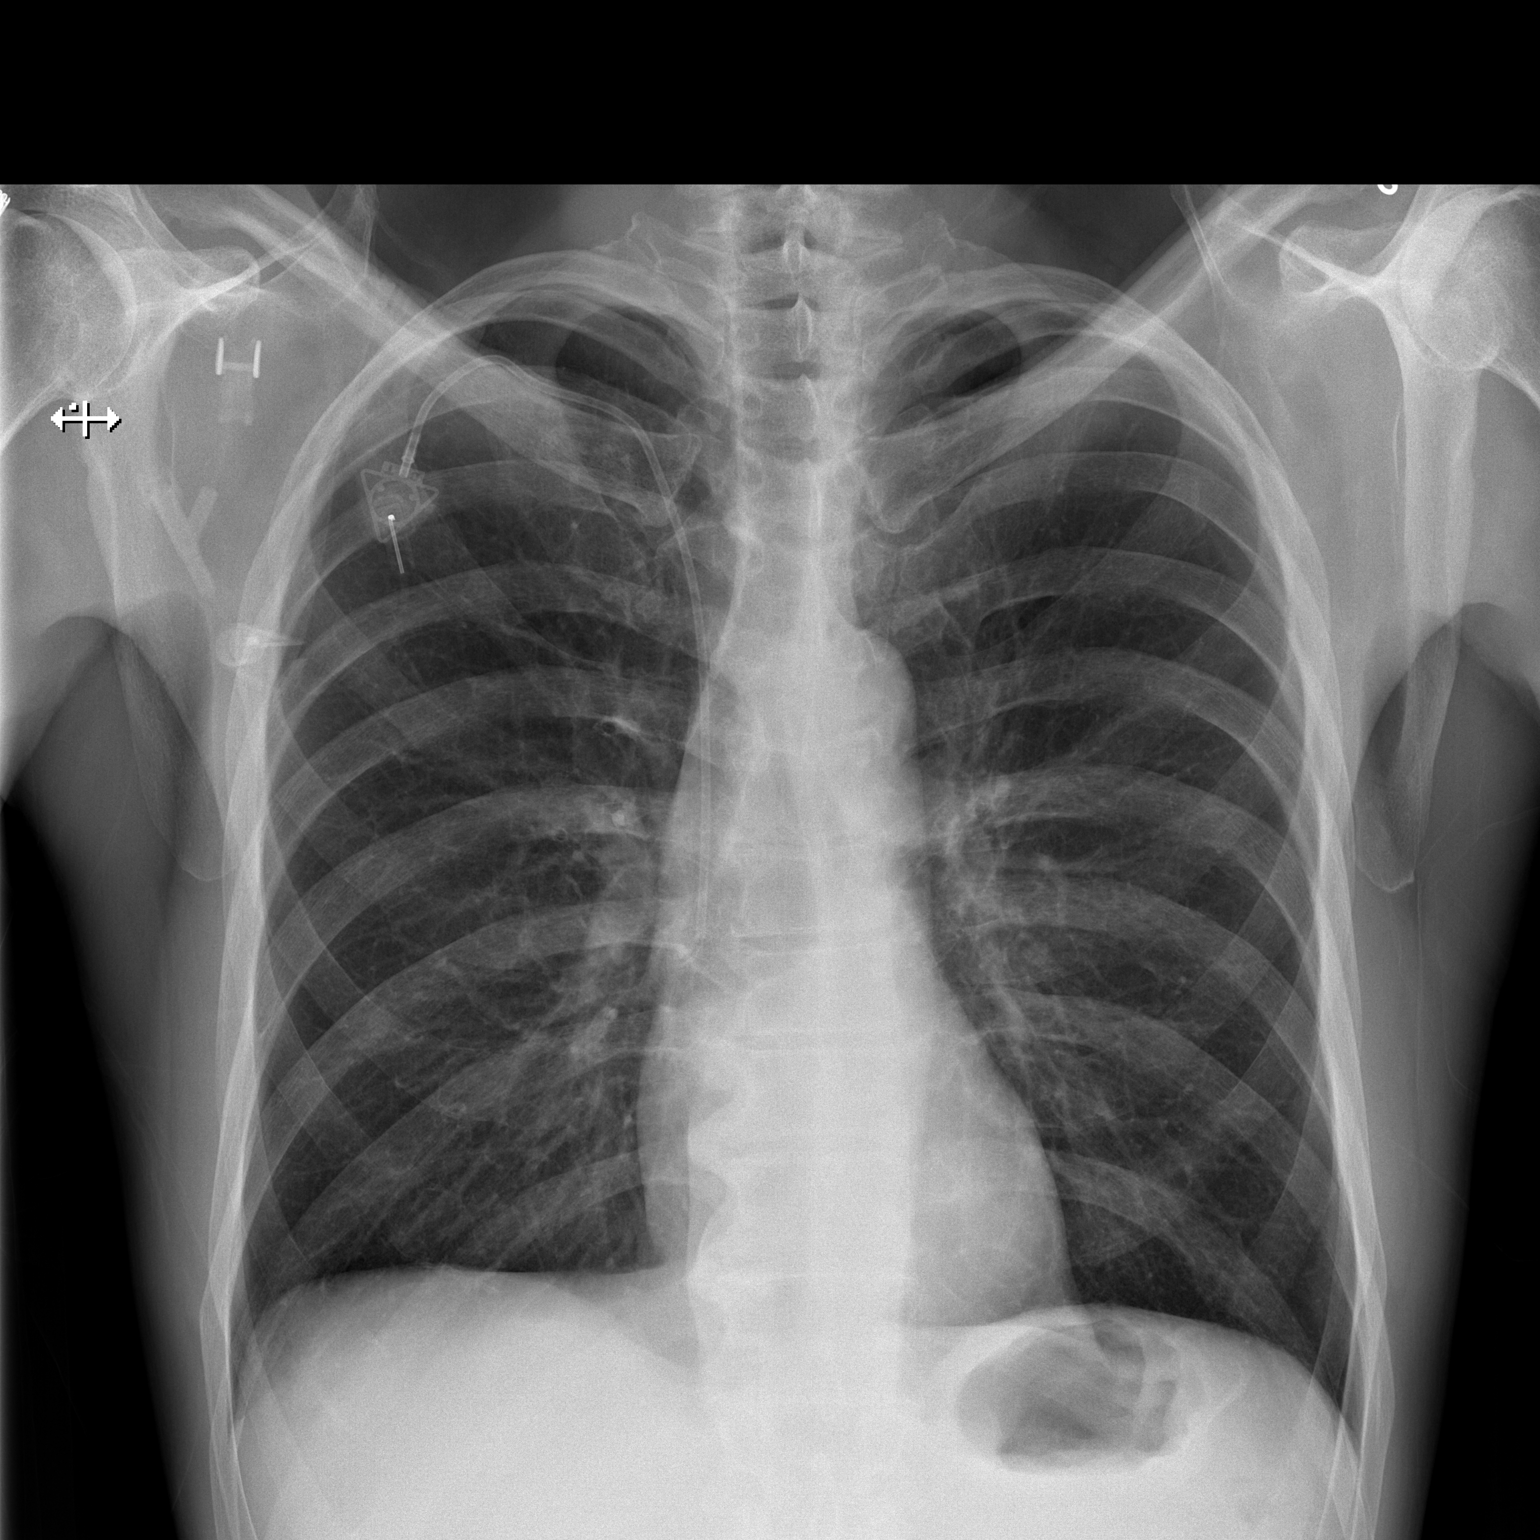

[w chest lat]
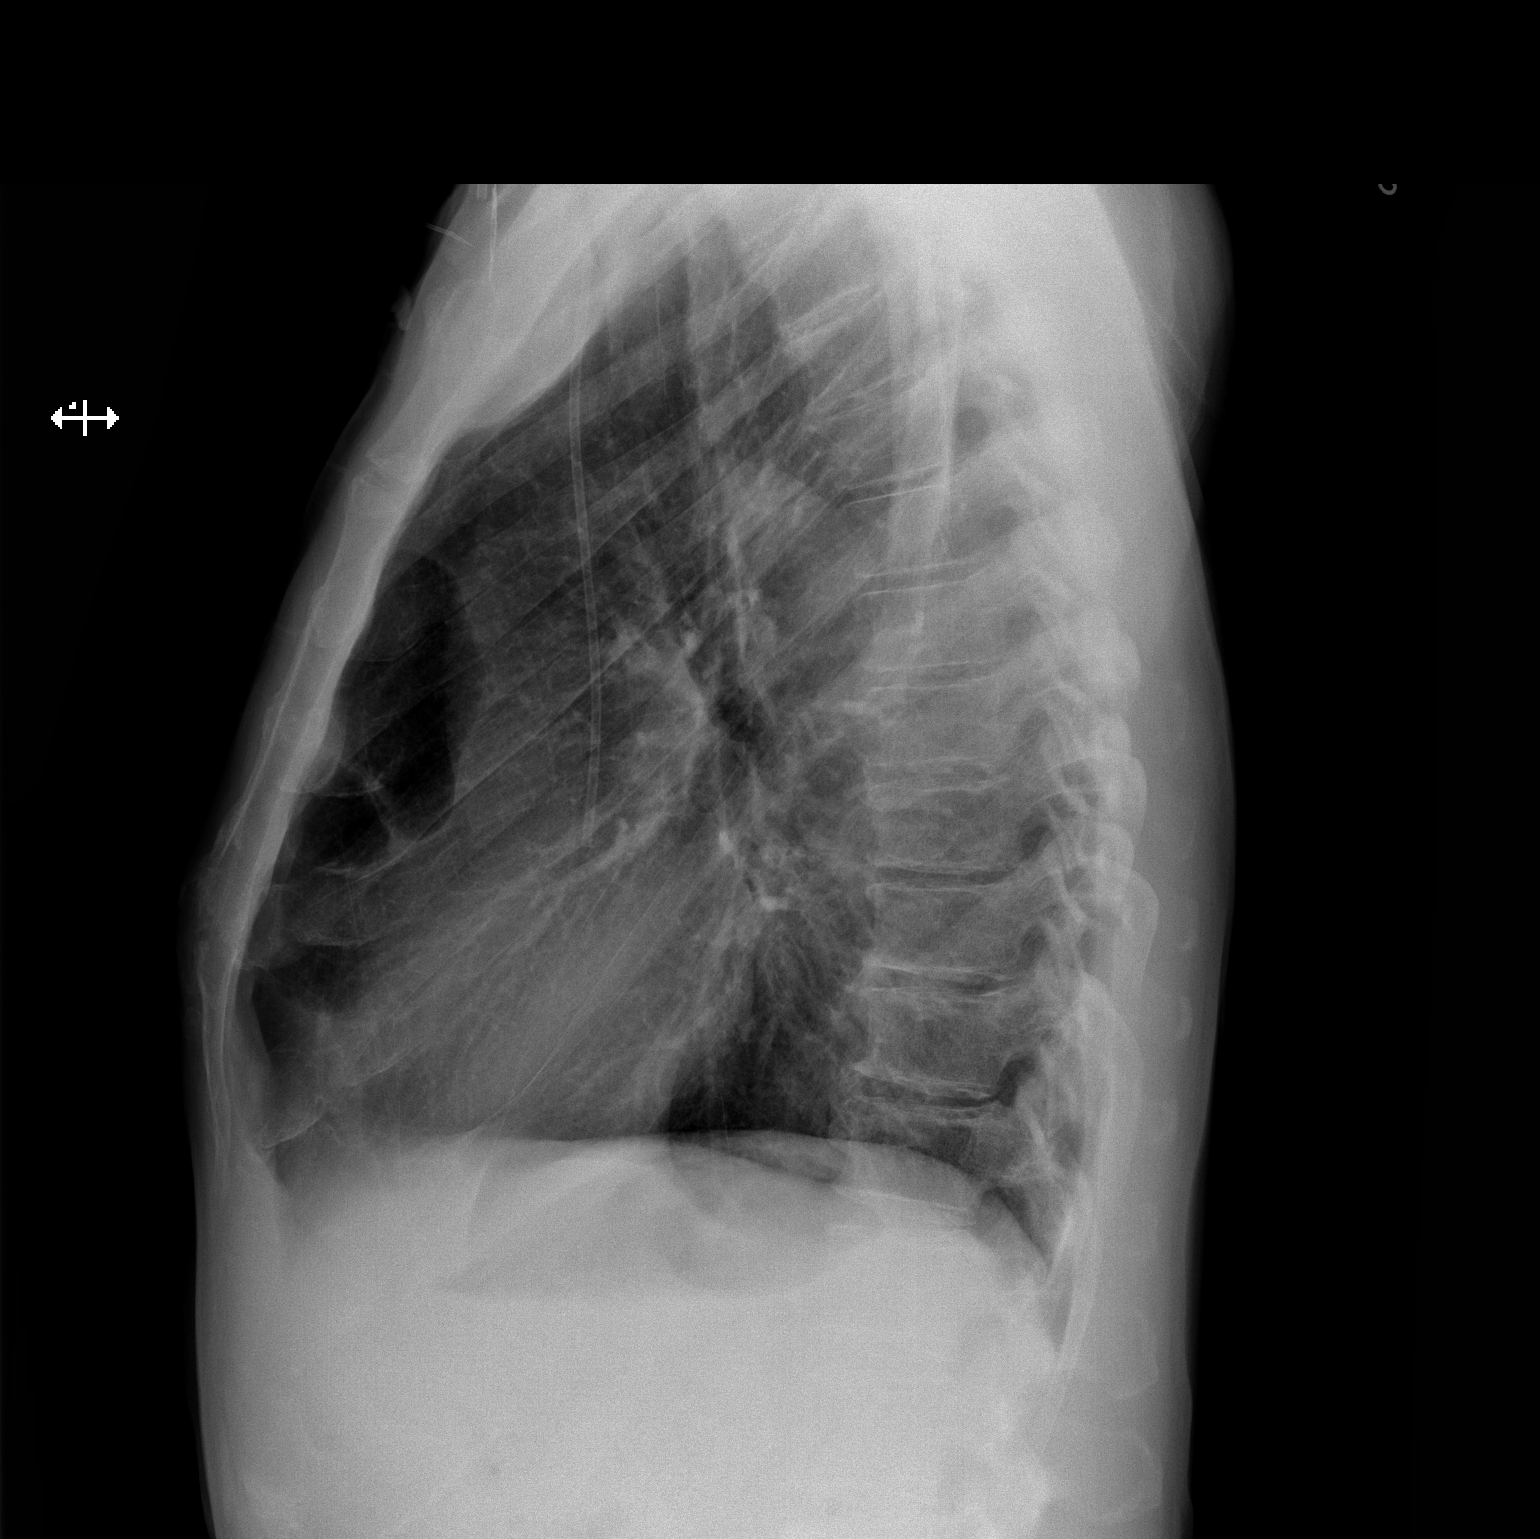

[2 of 2 positions shown; findings below may reference images not displayed]

FINDINGS: Heart size and mediastinal contours are normal.  Both
lungs are clear.  No evidence of pleural effusion.  No mass or
lymphadenopathy identified.  Right-sided power port remains in
appropriate position.
IMPRESSION: No active cardiopulmonary disease.

## 2014-07-14 IMAGING — CT CT ANGIO CHEST
2 of 5 series · 18 of 32 positions shown · IV contrast (OMNIPAQUE 300)
Comparison: CT abdomen pelvis dated 02/23/2012.  CT chest dated
01/31/2011.

CTA CHEST

CLINICAL DATA: Metastatic pancreatic cancer status post Whipple,
abdominal pain, evaluate for PE

CT ANGIOGRAPHY CHEST
CT ABDOMEN AND PELVIS WITH CONTRAST
TECHNIQUE: Multidetector CT imaging of the chest was performed
using the standard protocol during bolus administration of
intravenous contrast.  Multiplanar CT image reconstructions
including MIPs were obtained to evaluate the vascular anatomy.
Multidetector CT imaging of the abdomen and pelvis was performed
intravenous contrast.
Contrast: 100mL OMNIPAQUE IOHEXOL 350 MG/ML SOLN

[Series 5: abd/pel with · axial · 0.74mm/px · z∈[+1089,+1319]mm · 3 of 94 slices shown]
[im 24/94  lung]
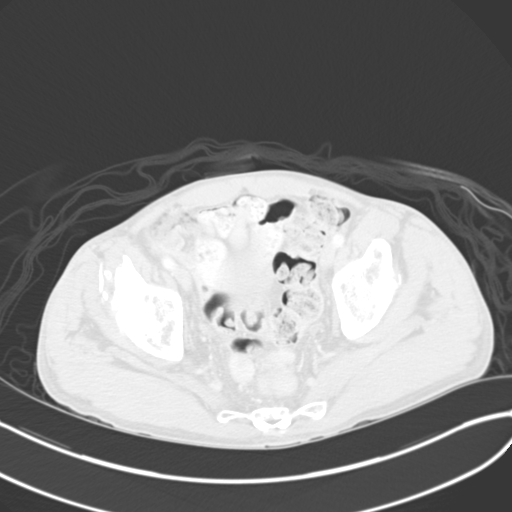
[im 47/94  lung]
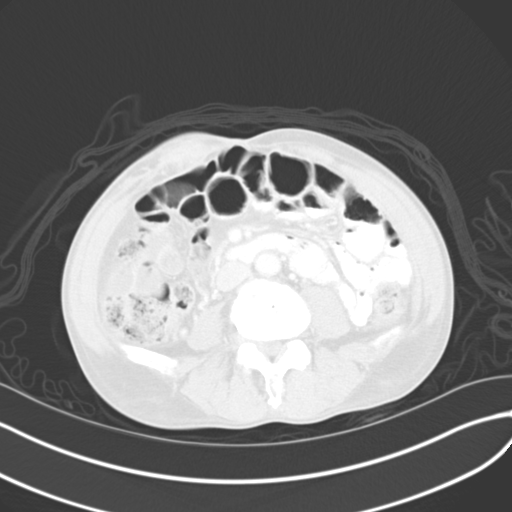
[im 70/94  lung]
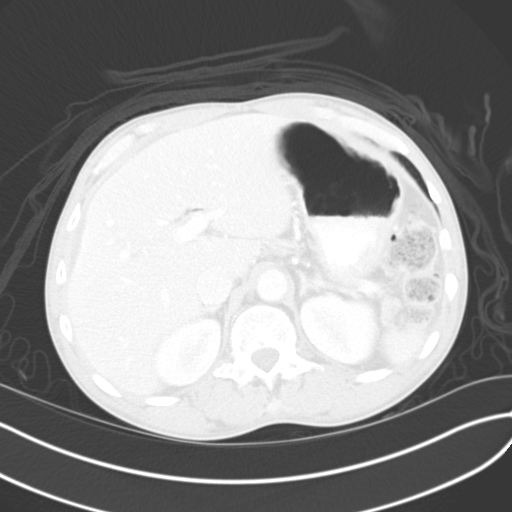

[Series 12: thins for pacs · axial · 0.74mm/px · z∈[+1330,+1596]mm · 15 of 304 slices shown]
[im 19/304  lung]
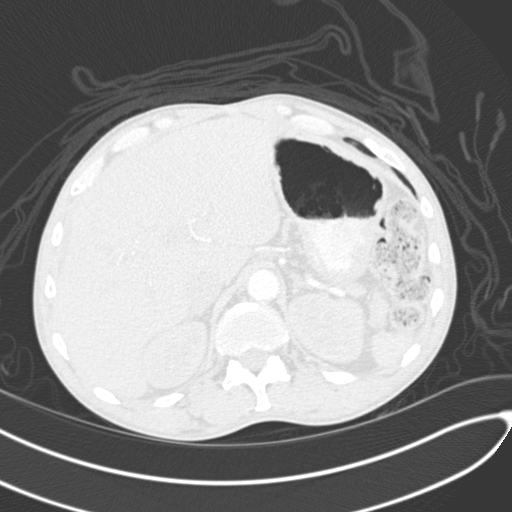
[im 38/304  soft-tissue]
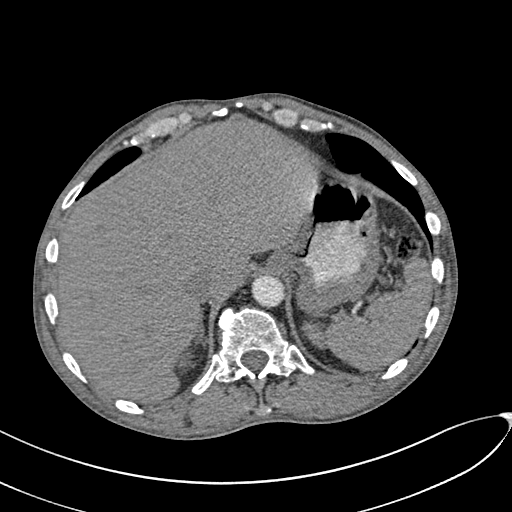
[im 57/304  lung]
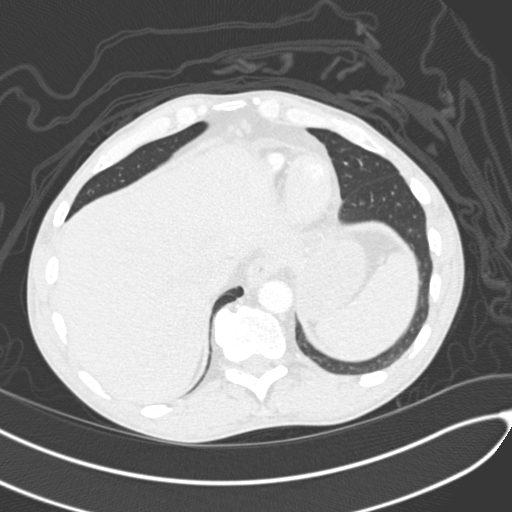
[im 76/304  soft-tissue]
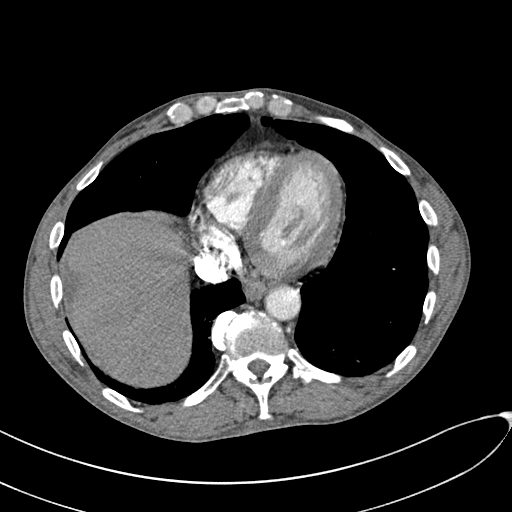
[im 95/304  lung]
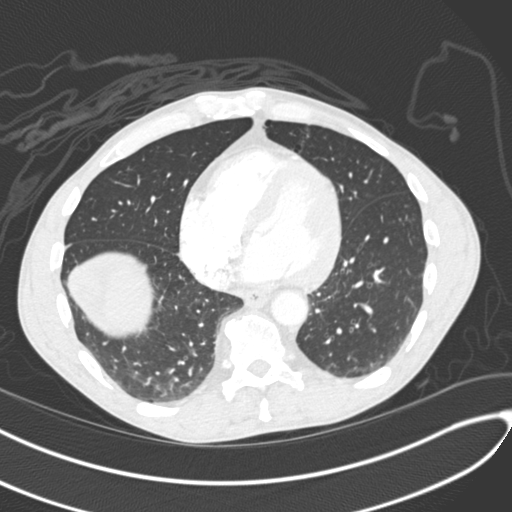
[im 114/304  soft-tissue]
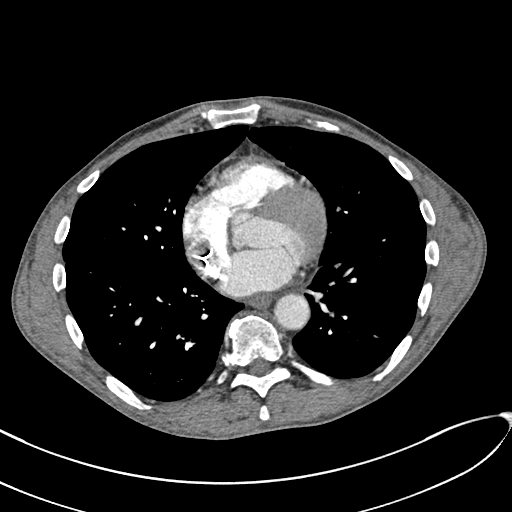
[im 133/304  lung]
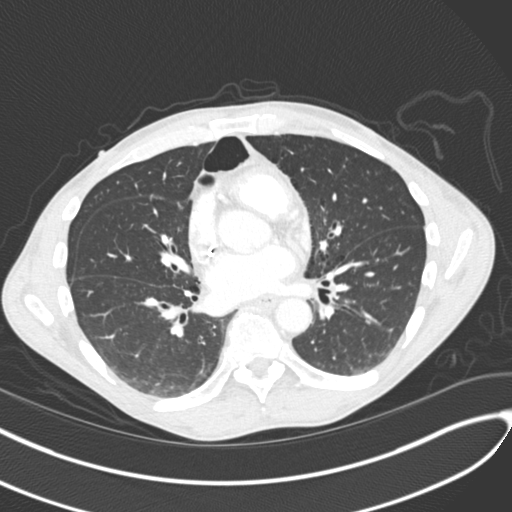
[im 152/304  soft-tissue]
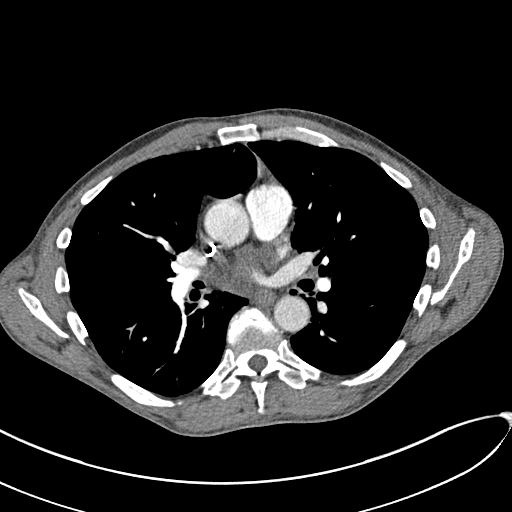
[im 171/304  lung]
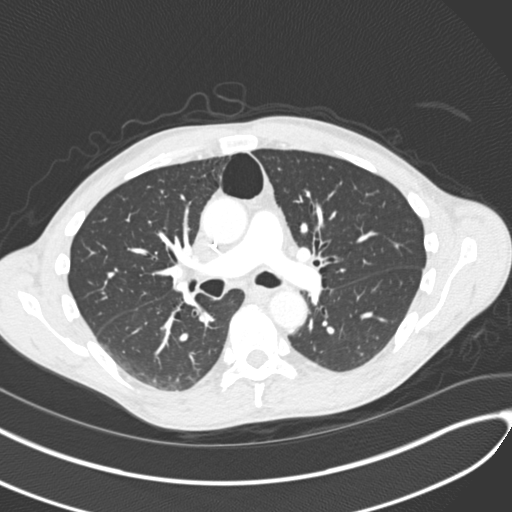
[im 190/304  soft-tissue]
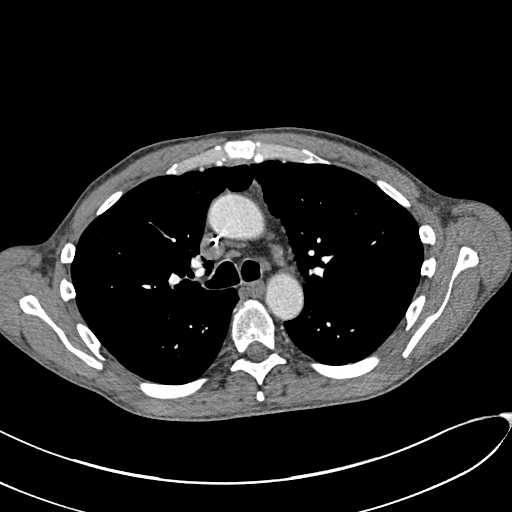
[im 209/304  lung]
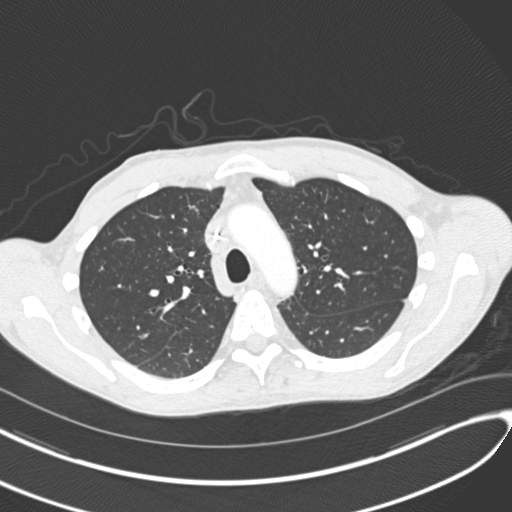
[im 228/304  soft-tissue]
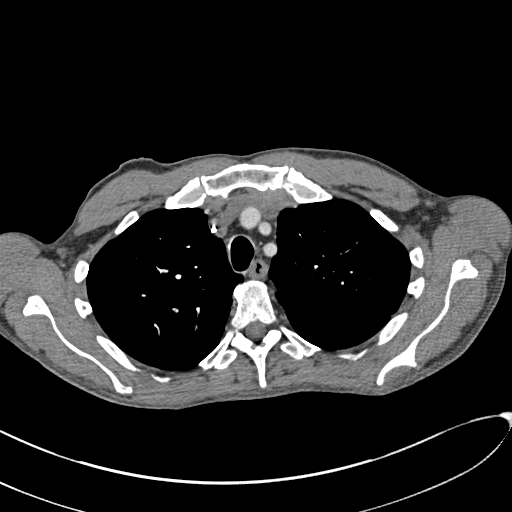
[im 247/304  lung]
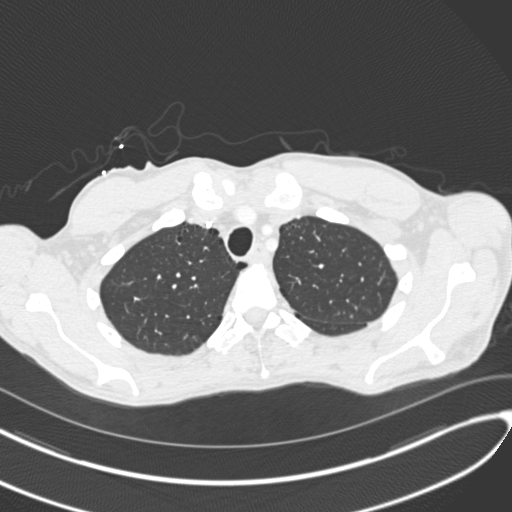
[im 266/304  soft-tissue]
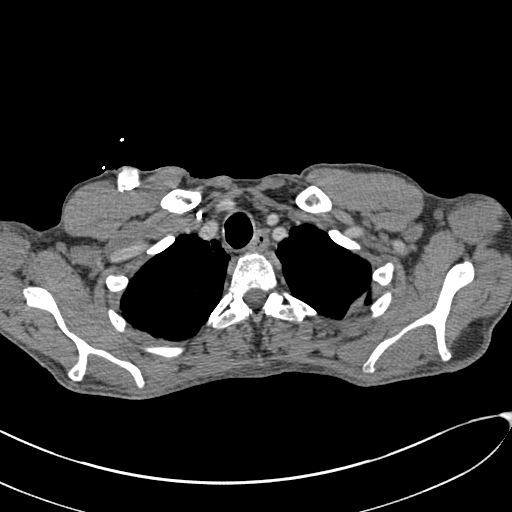
[im 285/304  lung]
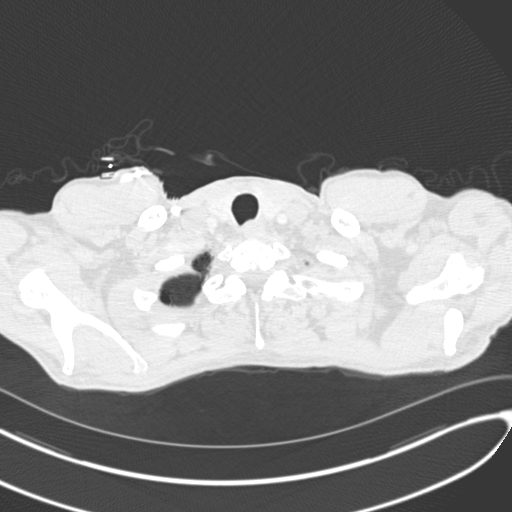

[18 of 32 positions shown; findings below may reference images not displayed]

FINDINGS: No evidence of pulmonary embolism.

No suspicious pulmonary nodules.  Mild paraseptal emphysematous
changes.  Biapical pleural parenchymal scarring.  Minimal dependent
atelectasis in the bilateral lower lobes.  No pleural effusion or
pneumothorax.

Visualized thyroid is unremarkable.

The heart is normal in size.  No pericardial effusion.

Small mediastinal and left axillary lymph nodes which do not meet
pathologic CT size criteria.  No suspicious hilar lymphadenopathy.

Right chest port.

Degenerative changes of the visualized thoracolumbar spine.

 Review of the MIP images confirms the above findings.
IMPRESSION: No evidence of pulmonary embolism.

No evidence of metastatic disease in the chest.

Mild paraseptal emphysematous changes.

CT ABDOMEN AND PELVIS
FINDINGS: Postsurgical changes related to Whipple procedure.
Residual pancreatic body and tail are unremarkable.

Patent gastrojejunostomy.  No evidence of bowel obstruction.

Gallbladder is surgical surgically absent.  Associated pneumobilia.
No intrahepatic or extrahepatic ductal dilatation.

No suspicious/enhancing hepatic lesions.  4.0 x 2.5 cm peritoneal
implant along the right hepatic dome (series 5/image 11),
previously 4.1 x 1.9 cm, mildly increased.  Peritoneal implant
along the inferior right hepatic lobe measures 7 mm short axis
(series 5/image 34), previously 9 mm.

Spleen and adrenal glands are within normal limits.

Kidneys are within normal limits.  No hydronephrosis.

Atherosclerotic calcifications of the abdominal aorta and branch
vessels.

No abdominopelvic ascites.

Pelvic metastases have mildly decreased.  Right pelvic implant
measures 2.8 x 1.9 cm (series 5/image 73), previously 3.3 x 2.1 cm.
Left pelvic can plan measures 2.2 x 3.5 cm (series 5/image 74),
previously 2.3 x 3.6 cm.

Prostate is unremarkable.

Bladder is within normal limits.

3.2 x 4.4 cm metastasis in the right rectus abdominus muscle
(series 5/image 44), previously 2.9 x 4.1 cm, mildly increased.

Degenerative changes of the lumbar spine.

Review of the MIP images confirms the above findings.
IMPRESSION: Status post Whipple procedure.

Metastases along the right hepatic dome and right rectus abdominus
muscle have mildly increased, as described above.

Pelvic metastases have mildly decreased, as described above.
